# Patient Record
Sex: Male | Born: 1945 | Hispanic: No | Marital: Married | Smoking: Former smoker
Health system: Southern US, Community
[De-identification: ages and names within clinical notes are randomized; demographics above are authoritative.]

## PROBLEM LIST (undated history)

## (undated) DIAGNOSIS — R21 Rash and other nonspecific skin eruption: Secondary | ICD-10-CM

## (undated) DIAGNOSIS — L570 Actinic keratosis: Secondary | ICD-10-CM

## (undated) DIAGNOSIS — Z86718 Personal history of other venous thrombosis and embolism: Secondary | ICD-10-CM

## (undated) DIAGNOSIS — M1990 Unspecified osteoarthritis, unspecified site: Secondary | ICD-10-CM

## (undated) DIAGNOSIS — G4730 Sleep apnea, unspecified: Secondary | ICD-10-CM

## (undated) DIAGNOSIS — C61 Malignant neoplasm of prostate: Secondary | ICD-10-CM

## (undated) HISTORY — DX: Personal history of other venous thrombosis and embolism: Z86.718

## (undated) HISTORY — DX: Actinic keratosis: L57.0

## (undated) HISTORY — PX: TOTAL KNEE ARTHROPLASTY: SHX125

## (undated) HISTORY — DX: Malignant neoplasm of prostate: C61

## (undated) HISTORY — PX: TOE SURGERY: SHX1073

## (undated) HISTORY — DX: Sleep apnea, unspecified: G47.30

---

## 2005-03-01 ENCOUNTER — Ambulatory Visit: Payer: Self-pay | Admitting: Gastroenterology

## 2006-04-17 ENCOUNTER — Ambulatory Visit: Payer: Self-pay | Admitting: Vascular Surgery

## 2008-01-05 ENCOUNTER — Ambulatory Visit: Payer: Self-pay | Admitting: Specialist

## 2008-01-13 ENCOUNTER — Inpatient Hospital Stay: Payer: Self-pay | Admitting: Specialist

## 2009-01-24 ENCOUNTER — Ambulatory Visit: Payer: Self-pay | Admitting: Cardiology

## 2009-01-24 ENCOUNTER — Ambulatory Visit: Payer: Self-pay | Admitting: Specialist

## 2009-02-03 ENCOUNTER — Inpatient Hospital Stay: Payer: Self-pay | Admitting: Specialist

## 2011-02-13 DIAGNOSIS — E669 Obesity, unspecified: Secondary | ICD-10-CM | POA: Insufficient documentation

## 2011-02-22 DIAGNOSIS — E1169 Type 2 diabetes mellitus with other specified complication: Secondary | ICD-10-CM | POA: Insufficient documentation

## 2012-04-17 ENCOUNTER — Emergency Department: Payer: Self-pay | Admitting: Emergency Medicine

## 2012-04-17 LAB — RAPID INFLUENZA A&B ANTIGENS

## 2012-04-17 LAB — CBC WITH DIFFERENTIAL/PLATELET
Basophil #: 0 10*3/uL (ref 0.0–0.1)
Basophil %: 0.3 %
Eosinophil %: 0.2 %
HCT: 44.6 % (ref 40.0–52.0)
HGB: 15 g/dL (ref 13.0–18.0)
MCH: 28.9 pg (ref 26.0–34.0)
MCHC: 33.6 g/dL (ref 32.0–36.0)
Monocyte #: 1.1 x10 3/mm — ABNORMAL HIGH (ref 0.2–1.0)
Monocyte %: 9 %
Neutrophil #: 10.5 10*3/uL — ABNORMAL HIGH (ref 1.4–6.5)
Platelet: 303 10*3/uL (ref 150–440)
RBC: 5.18 10*6/uL (ref 4.40–5.90)
RDW: 13.6 % (ref 11.5–14.5)
WBC: 12.5 10*3/uL — ABNORMAL HIGH (ref 3.8–10.6)

## 2012-04-17 LAB — COMPREHENSIVE METABOLIC PANEL
Albumin: 2.9 g/dL — ABNORMAL LOW (ref 3.4–5.0)
Alkaline Phosphatase: 84 U/L (ref 50–136)
BUN: 25 mg/dL — ABNORMAL HIGH (ref 7–18)
Bilirubin,Total: 0.7 mg/dL (ref 0.2–1.0)
Calcium, Total: 8.6 mg/dL (ref 8.5–10.1)
Co2: 26 mmol/L (ref 21–32)
Creatinine: 1.21 mg/dL (ref 0.60–1.30)
EGFR (Non-African Amer.): >60
SGPT (ALT): 24 U/L (ref 12–78)
Total Protein: 7.6 g/dL (ref 6.4–8.2)

## 2012-04-23 ENCOUNTER — Ambulatory Visit: Payer: Self-pay | Admitting: Family Medicine

## 2012-04-28 ENCOUNTER — Ambulatory Visit: Payer: Self-pay | Admitting: Family Medicine

## 2012-04-29 ENCOUNTER — Ambulatory Visit: Payer: Self-pay | Admitting: Family Medicine

## 2012-04-30 ENCOUNTER — Ambulatory Visit (INDEPENDENT_AMBULATORY_CARE_PROVIDER_SITE_OTHER): Payer: Medicare Other | Admitting: Pulmonary Disease

## 2012-04-30 ENCOUNTER — Telehealth: Payer: Self-pay | Admitting: Pulmonary Disease

## 2012-04-30 ENCOUNTER — Encounter: Payer: Self-pay | Admitting: Pulmonary Disease

## 2012-04-30 ENCOUNTER — Ambulatory Visit: Payer: Self-pay | Admitting: Pulmonary Disease

## 2012-04-30 VITALS — BP 128/82 | HR 94 | Temp 98.2°F | Ht 70.0 in | Wt 240.6 lb

## 2012-04-30 DIAGNOSIS — R600 Localized edema: Secondary | ICD-10-CM | POA: Insufficient documentation

## 2012-04-30 DIAGNOSIS — J90 Pleural effusion, not elsewhere classified: Secondary | ICD-10-CM

## 2012-04-30 DIAGNOSIS — R609 Edema, unspecified: Secondary | ICD-10-CM

## 2012-04-30 DIAGNOSIS — Z8709 Personal history of other diseases of the respiratory system: Secondary | ICD-10-CM | POA: Insufficient documentation

## 2012-04-30 NOTE — Telephone Encounter (Signed)
Stephanie at [employer] [facility] called stated that pt  report for his bilat leg doppler was Neg for dvt. Verbally let KC know.

## 2012-04-30 NOTE — Assessment & Plan Note (Signed)
The patient appears to have a very loculated left pleural effusion but I suspect is parapneumonic in nature given the patient's history.  More than likely this is not infected, but it is causing significant right lower lobe atelectasis, shortness of breath, and pleuritic pain.  We could certainly attempt a thoracentesis, but I suspect this will be suboptimal treatment.  More than likely he is going to need thoracoscopic surgery for drainage.  I would like to send him to a thoracic surgeon for their opinion, and the patient is agreeable.

## 2012-04-30 NOTE — Patient Instructions (Addendum)
Will get you referred to a chest surgeon to look at your fluid collection in left chest. Will ultrasound your legs to make sure no blood clots, and call you with results.

## 2012-04-30 NOTE — Progress Notes (Signed)
  Subjective:    Patient ID: Eric Stone, male    DOB: 1945/11/15, 67 y.o.   MRN: [id number]  HPI The patient is a 67 year old male who I've been asked to see for persistent pulmonary symptoms and an abnormal CT chest.  The patient was in his usual state of health with no breathing issues or cough until January of this year.  He began to have cough, malaise, worsening shortness of breath, as well as a low grade fever.  He didn't begin to develop some discolored mucus.  He had a chest x-ray that apparently showed an abnormal left lower lobe airspace process, and was ultimately treated for pneumonia with Levaquin and then a Z-Pak.  A followup chest x-ray continued to be abnormal, and then he had a CT chest.  This showed a moderate pleural effusion that is clearly loculated, and extends up the left chest wall into the apex.  There is compression of the left lower lobe with significant atelectasis, and a few tiny pulmonary nodules that appear most consistent with granulomas.  The patient feels somewhat better since being treated with antibiotics, but continues to have a cough with white mucus production.  He also has chills but no fever, and has persistent shortness of breath with activity and if he tries to lie down.  He also notes left-sided pleuritic chest pain at times.  He also has developed worsening lower extremity edema since all of this has been going on.  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   Review of Systems  Constitutional: Positive for appetite change. Negative for fever and unexpected weight change.  HENT: Negative for ear pain, nosebleeds, congestion, sore throat, rhinorrhea, sneezing, trouble swallowing, dental problem, postnasal drip and sinus pressure.   Eyes: Negative for redness and itching.  Respiratory: Positive for cough and shortness of breath. Negative for chest tightness and wheezing.   Cardiovascular: Negative for palpitations and leg swelling.   Gastrointestinal: Negative for nausea and vomiting.  Genitourinary: Negative for dysuria.  Musculoskeletal: Positive for joint swelling.  Skin: Negative for rash.  Neurological: Negative for headaches.  Hematological: Does not bruise/bleed easily.  Psychiatric/Behavioral: Negative for dysphoric mood. The patient is not nervous/anxious.        Objective:   Physical Exam Constitutional:  Well developed, no acute distress  HENT:  Nares patent without discharge  Oropharynx without exudate, palate and uvula are normal  Eyes:  Perrla, eomi, no scleral icterus  Neck:  No JVD, no TMG  Cardiovascular:  Normal rate, regular rhythm, no rubs or gallops.  No murmurs        Intact distal pulses, but diminished.  Pulmonary :  Decreased bs right base, no stridor or respiratory distress   No rhonchi, or wheezing.  + crackles left base, no e to a changes heard  Abdominal:  Soft, nondistended, bowel sounds present.  No tenderness noted.   Musculoskeletal:  2+ lower extremity edema noted.  Lymph Nodes:  No cervical lymphadenopathy noted  Skin:  No cyanosis noted  Neurologic:  Alert, appropriate, moves all 4 extremities without obvious deficit.         Assessment & Plan:

## 2012-04-30 NOTE — Assessment & Plan Note (Signed)
The patient has significant bilateral lower extremity edema that has developed since this particular illness.  The patient has a history of DVT in the distant past, and admits that he has been very sedentary.  I think it is prudent to do lower extremity venous Dopplers for completeness.  These will be ordered at [facility].

## 2012-05-01 ENCOUNTER — Telehealth: Payer: Self-pay | Admitting: Pulmonary Disease

## 2012-05-02 NOTE — Telephone Encounter (Signed)
Pt aware he should here from someone today with an appt time and date for next week Eric Stone

## 2012-05-05 ENCOUNTER — Other Ambulatory Visit: Payer: Self-pay

## 2012-05-05 ENCOUNTER — Encounter (HOSPITAL_COMMUNITY): Payer: Self-pay

## 2012-05-05 ENCOUNTER — Encounter (HOSPITAL_COMMUNITY): Payer: Self-pay | Admitting: Pharmacy Technician

## 2012-05-05 ENCOUNTER — Institutional Professional Consult (permissible substitution) (INDEPENDENT_AMBULATORY_CARE_PROVIDER_SITE_OTHER): Payer: Medicare Other | Admitting: Cardiothoracic Surgery

## 2012-05-05 ENCOUNTER — Encounter (HOSPITAL_COMMUNITY)
Admission: RE | Admit: 2012-05-05 | Discharge: 2012-05-05 | Disposition: A | Discharge status: Home / Self Care | Payer: Medicare Other | Source: Ambulatory Visit | Attending: Cardiothoracic Surgery | Admitting: Cardiothoracic Surgery

## 2012-05-05 ENCOUNTER — Encounter: Payer: Self-pay | Admitting: Cardiothoracic Surgery

## 2012-05-05 VITALS — BP 141/91 | HR 101 | Temp 97.6°F | Resp 20 | Ht 70.0 in | Wt 237.4 lb

## 2012-05-05 VITALS — BP 156/94 | HR 91 | Resp 16 | Ht 70.0 in | Wt 240.0 lb

## 2012-05-05 DIAGNOSIS — J90 Pleural effusion, not elsewhere classified: Secondary | ICD-10-CM

## 2012-05-05 HISTORY — DX: Unspecified osteoarthritis, unspecified site: M19.90

## 2012-05-05 HISTORY — DX: Rash and other nonspecific skin eruption: R21

## 2012-05-05 LAB — PROTIME-INR
INR: 1.03 (ref 0.00–1.49)
Prothrombin Time: 13.4 seconds (ref 11.6–15.2)

## 2012-05-05 LAB — COMPREHENSIVE METABOLIC PANEL
ALT: 18 U/L (ref 0–53)
AST: 18 U/L (ref 0–37)
Albumin: 2.9 g/dL — ABNORMAL LOW (ref 3.5–5.2)
Alkaline Phosphatase: 57 U/L (ref 39–117)
BUN: 10 mg/dL (ref 6–23)
CO2: 25 mEq/L (ref 19–32)
Calcium: 9.1 mg/dL (ref 8.4–10.5)
Chloride: 104 mEq/L (ref 96–112)
Creatinine, Ser: 0.74 mg/dL (ref 0.50–1.35)
GFR calc Af Amer: >90 mL/min (ref >90)
GFR calc non Af Amer: >90 mL/min (ref >90)
Glucose, Bld: 110 mg/dL — ABNORMAL HIGH (ref 70–99)
Potassium: 3.9 mEq/L (ref 3.5–5.1)
Sodium: 141 mEq/L (ref 135–145)
Total Bilirubin: 0.2 mg/dL — ABNORMAL LOW (ref 0.3–1.2)
Total Protein: 7.5 g/dL (ref 6.0–8.3)

## 2012-05-05 LAB — TYPE AND SCREEN
ABO/RH(D): A POS
Antibody Screen: NEGATIVE

## 2012-05-05 LAB — BLOOD GAS, ARTERIAL
Acid-Base Excess: 1.2 mmol/L (ref 0.0–2.0)
Bicarbonate: 24.7 mEq/L — ABNORMAL HIGH (ref 20.0–24.0)
Drawn by: 206361
FIO2: 0.21 %
O2 Saturation: 96.5 %
Patient temperature: 98.6
TCO2: 25.8 mmol/L (ref 0–100)
pCO2 arterial: 35.3 mmHg (ref 35.0–45.0)
pH, Arterial: 7.458 — ABNORMAL HIGH (ref 7.350–7.450)
pO2, Arterial: 83.5 mmHg (ref 80.0–100.0)

## 2012-05-05 LAB — URINALYSIS, ROUTINE W REFLEX MICROSCOPIC
Bilirubin Urine: NEGATIVE
Glucose, UA: NEGATIVE mg/dL
Hgb urine dipstick: NEGATIVE
Ketones, ur: NEGATIVE mg/dL
Leukocytes, UA: NEGATIVE
Nitrite: NEGATIVE
Protein, ur: NEGATIVE mg/dL
Specific Gravity, Urine: 1.02 (ref 1.005–1.030)
Urobilinogen, UA: 0.2 mg/dL (ref 0.0–1.0)
pH: 5.5 (ref 5.0–8.0)

## 2012-05-05 LAB — SURGICAL PCR SCREEN
MRSA, PCR: NEGATIVE
Staphylococcus aureus: NEGATIVE

## 2012-05-05 LAB — CBC
HCT: 41.2 % (ref 39.0–52.0)
Hemoglobin: 13.9 g/dL (ref 13.0–17.0)
MCH: 28.9 pg (ref 26.0–34.0)
MCHC: 33.7 g/dL (ref 30.0–36.0)
MCV: 85.7 fL (ref 78.0–100.0)
Platelets: 326 10*3/uL (ref 150–400)
RBC: 4.81 MIL/uL (ref 4.22–5.81)
RDW: 13 % (ref 11.5–15.5)
WBC: 8.3 10*3/uL (ref 4.0–10.5)

## 2012-05-05 LAB — APTT: aPTT: 34 seconds (ref 24–37)

## 2012-05-05 LAB — ABO/RH: ABO/RH(D): A POS

## 2012-05-05 MED ORDER — VANCOMYCIN HCL 10 G IV SOLR
1500.0000 mg | INTRAVENOUS | Status: AC
  Administered 2012-05-06: 1500 mg via INTRAVENOUS

## 2012-05-05 NOTE — Patient Instructions (Signed)
Thoracoscopy Thoracoscopy is a procedure in which a thin, lighted tube (thoracoscope) is put through a small cut (incision) in the chest wall. This procedure makes it possible for your caregiver to look at the lungs or other structures in the chest cavity and to do some minor operations. This is a more minor procedure than thoracotomy, which opens the chest cavity with a large incision. Thoracoscopy can sometimes be used instead of thoracotomy. Thoracoscopy usually involves less pain, a shorter hospital stay, and a shorter recovery time. Common reasons for this procedure are:  To study diseases or problems in the chest.  To take a tissue sample (biopsy) to study under a microscope.  To put medicines directly into the lungs.  To remove collections of fluid, pus (empyema), or blood in the chest. LET YOUR CAREGIVER KNOW ABOUT:   Allergies to food or medicine.  Medicines taken, including vitamins, herbs, eyedrops, over-the-counter medicines, and creams.  Use of steroids (by mouth or creams).  Previous problems with anesthetics or numbing medicines.  History of bleeding problems or blood clots.  Previous surgery.  Any history of heart problems.  Other health problems, including diabetes and kidney problems.  Possibility of pregnancy, if this applies. RISKS AND COMPLICATIONS   If too much bleeding occurs, or if the surgery turns out to be major, it may be necessary to open the chest (thoracotomy) to control the bleeding.  There is a risk of injury to nerves or other structures in the chest as a result of the placement of the instruments.  When the chest tube is removed, the lung may collapse (pneumothorax). If this happens, the tube may need to be reinserted and left in place until a time when the lung will remain expanded as the tube is removed. BEFORE THE PROCEDURE   You may have routine tests done, such as blood tests, urine tests, and chest X-rays.  Electrocardiography (EKG) to  record the electrical activity of the heart may be done to make sure the heart is okay.  Do not eat or drink after midnight the night before the procedure. Medicine given before the procedure that makes you sleep (general anesthetic) may cause vomiting. A patient who vomits is in danger of inhaling food into the lungs. This can cause serious complications and can be life-threatening. PROCEDURE  Video-assisted thoracic surgery (VATS) is surgery using a thoracoscope with a small video camera on the end. The picture from inside the chest is displayed on a television screen for the surgeon to see. The lung being worked on is collapsed and several small incisions are made to insert instruments. The surgeon can manipulate the instruments while watching on the television screen. The thoracoscope may be removed and put into different areas as needed. When the procedure is finished, the surgeon expands the lung and puts one or more chest tubes in the chest. The chest tubes allow the lung to expand and allow fluid (drainage) to come out. The remaining incisions are closed with stitches (sutures) or staples. AFTER THE PROCEDURE   The chest tube is left in place for 1 to several days to drain fluid or air from the chest cavity.  Hospital stays range from 1 to 5 days depending on the procedure and treatment. Document Released: 12/09/2002 Document Revised: 06/04/2011 Document Reviewed: 08/30/2010 [facility] Patient Information 2013 [place], [place]. Thoracoscopy Care After Refer to this sheet in the next few weeks. These discharge instructions provide you with general information on caring for yourself after you leave the  hospital. Your caregiver may also give you specific instructions. Your treatment has been planned according to the most current medical practices available, but unavoidable complications sometimes occur. If you have any problems or questions after discharge, call your caregiver. HOME CARE  INSTRUCTIONS   Remove the bandage (dressing) over your chest tube site as directed by your caregiver.  It is normal to be sore for a couple weeks following surgery. See your caregiver if this seems to be getting worse rather than better.  Only take over-the-counter or prescription medicines for pain, discomfort, or fever as directed by your caregiver. It is very important to take pain medicine when you need it so that you will cough and breathe deeply enough to clear mucus (phlegm) and expand your lungs.  If it hurts to cough, hold a pillow against your chest when you cough. This may help with the discomfort. In spite of the discomfort, cough frequently, as this helps protect against getting an infection in your lung (pneumonia).  Taking deep breaths keeps lungs inflated and protects against pneumonia. Most patients will go home with an incentive spirometer that encourages deep breathing.  You may resume a normal diet and activities as directed.  Use showers for bathing until you see your caregiver, or as instructed.  Change dressings if necessary or as directed.  Avoid lifting or driving until you are instructed otherwise.  Make an appointment to see your caregiver for stitch (suture) or staple removal when instructed.  Do not travel by airplane for 2 weeks after the chest tube is removed. SEEK MEDICAL CARE IF:   You are bleeding from your wounds.  You have redness, swelling, or increasing pain in the wounds.  Your heartbeat feels irregular or very fast.  There is pus coming from your wounds.  There is a bad smell coming from the wound or dressing. SEEK IMMEDIATE MEDICAL CARE IF:   You have a fever.  You develop a rash.  You have difficulty breathing.  You develop any reaction or side effects to medicines given.  You develop lightheadedness or feel faint.  You develop shortness of breath or chest pain. MAKE SURE YOU:   Understand these instructions.  Will watch  your condition.  Will get help right away if you are not doing well or get worse. Document Released: 09/29/2004 Document Revised: 06/04/2011 Document Reviewed: 08/30/2010 [facility] Patient Information 2013 [employer], [place].

## 2012-05-05 NOTE — Progress Notes (Signed)
[address]            [place] [address]          [phone]      Dymond Spreen [facility] Medical Record #[id number] Date of Birth: 1945-08-12  Referring: Barbaraann Share, MD Primary Care: Park Pope, MD  Chief Complaint:    Chief Complaint  Patient presents with  . Pleural Effusion    left...eval and treat    History of Present Illness:      The patient  with no breathing issues or cough until January of this year. He began to have cough, malaise, worsening shortness of breath, as well as a low grade fever. He had a chest x-ray that apparently showed an abnormal left lower lobe airspace process, and was ultimately treated for pneumonia with Levaquin and then a Z-Pak. A followup chest x-ray continued to be abnormal, and then he had a CT chest.  The patient feels somewhat better since being treated with antibiotics, but continues to have a cough with white mucus production. He also has chills but no fever, and has persistent shortness of breath with activity and if he tries to lie down. He also notes left-sided pleuritic chest pain at times. He also has developed worsening lower extremity edema since all of this has been going on. 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  Patient has no known history of exposure to asbestos or tuberculosis. He has been a smoker in the past quit in 1991. He has not had pneumococcal or flu vaccination.   Current Activity/ Functional Status:  Patient is independent with mobility/ambulation, transfers, ADL's, IADL's.  Zubrod Score: At the time of surgery this patient's most appropriate activity status/level should be described as: []  Normal activity, no symptoms [x]  Symptoms, fully ambulatory []  Symptoms, in bed less than or equal to 50% of the time []  Symptoms, in bed greater than 50% of the time but less than 100% []  Bedridden []  Moribund   Past Medical History  Diagnosis Date  .  H/O blood clots   . Sleep apnea   . Pneumonia     Past Surgical History  Procedure Laterality Date  . 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 Hypertension Mother   . Stroke Mother     possible??   patient's brother died of carcinoma the lung in his 39s he was a smoker  History   Social History  . Marital Status: Married    Spouse Name: N/A    Number of Children: N/A  . Years of Education: N/A   Occupational History  . retired  patient is retired previously worked in Hotel manager facility , patient denies any exposure known to him to asbestos    Social History Main Topics  . Smoking status: Former Smoker -- 1.00 packs/day for 26 years    Types: Cigarettes    Quit date: 03/26/1989  . Smokeless tobacco: Not on file     Comment: less than 1 PPD  . Alcohol Use: No  . Drug Use: No  . Sexually Active: Not on file      History  Smoking status  . Former Smoker -- 1.00 packs/day for 26 years  . Types: Cigarettes  . Quit date:  03/26/1989  Smokeless tobacco  . Not on file    Comment: less than 1 PPD    History  Alcohol Use No     Allergies  Allergen Reactions  . Penicillins     As a child    Current Outpatient Prescriptions  Medication Sig Dispense Refill  . aspirin 81 MG tablet Take 81 mg by mouth daily.       No current facility-administered medications for this visit.       Review of Systems:     Cardiac Review of Systems: Y or N  Chest Pain [   y ]  Resting SOB [  n ] Exertional SOB  Cove.Etienne  ]  Orthopnea [ n ]   Pedal Edema [ y  ]    Palpitations [n  ] Syncope  [ n ]   Presyncope [ n  ]  General Review of Systems: [Y] = yes [  ]=no Constitional: recent weight change Milo.Brash  ]; anorexia [ n ]; fatigue Cove.Etienne  ]; nausea [n  ]; night sweats Cove.Etienne  ]; fever [ y]; or chills [  ];                                                                                                                                           Dental: poor dentition[  ]; Last Dentist visit:   Eye : blurred vision [  ]; diplopia [   ]; vision changes [  ];  Amaurosis fugax[  ]; Resp: cough [ y ];  wheezing[ n ];  hemoptysis[n  ]; shortness of breath[  ]; paroxysmal nocturnal dyspnea[  ]; dyspnea on exertion[y  ]; or orthopnea[  ];  GI:  gallstones[  ], vomiting[  ];  dysphagia[  ]; melena[  ];  hematochezia [  ]; heartburn[  ];   Hx of  Colonoscopy[  ]; GU: kidney stones [  ]; hematuria[  ];   dysuria [  ];  nocturia[  ];  history of     obstruction [  ]; urinary frequency [  ]             Skin: rash, swelling[  ];, hair loss[  ];  peripheral edema[  ];  or itching[  ]; Musculosketetal: myalgias[  ];  joint swelling[  ];  joint erythema[  ];  joint pain[  ];  back pain[  ];  Heme/Lymph: bruising[  ];  bleeding[  ];  anemia[  ];  Neuro: TIA[  ];  headaches[  ];  stroke[  ];  vertigo[  ];  seizures[  ];   paresthesias[  ];  difficulty walking[  ];  Psych:depression[  ]; anxiety[  ];  Endocrine: diabetes[ n ];  thyroid dysfunction[ n ];  Immunizations: Flu Milo.Brash  ]; Pneumococcal[ n ];  Other:  Physical Exam: BP 156/94  Pulse 91  Resp 16  Ht 5\' 10"  (1.778 m)  Wt 240 lb (108.863 kg)  BMI 34.44 kg/m2  SpO2 97%  General appearance: alert and cooperative Neurologic: intact Heart: regular rate and rhythm, S1, S2 normal, no murmur, click, rub or gallop and normal apical impulse Lungs: diminished breath sounds LLL and LUL Abdomen: soft, non-tender; bowel sounds normal; no masses,  no organomegaly Extremities: edema Patient has bilateral lower extremity edema  Recent venous Doppler showed no evidence of DVT  Diagnostic Studies & Laboratory data:     Recent Radiology Findings:  CT scan done in [place] shows complex loculated significant pleural effusion on the left,   Recent Lab Findings: No results found for this basename: WBC, HGB, HCT, PLT, GLUCOSE, CHOL, TRIG, HDL, LDLDIRECT, LDLCALC, ALT, AST, NA, K,  CL, CREATININE, BUN, CO2, TSH, INR, GLUF, HGBA1C      Assessment / Plan:    Patient with a history of upper respiratory infection x1 month now presents with complex left pleural effusion, and exertional shortness of breath  On review of the CT scan the patient has a complex large left pleural effusion probably parapneumonic related to pneumonia in January. This would best be treated with bronchoscopy video-assisted thoracoscopy with drainage and decortication. The patient does not appear actively toxic/septic at this time, I have her recommended to the patient that we proceed expeditiously to avoid the development of chronic fibrothorax. The risks and options are discussed with he and his wife in detail and he is willing to proceed. We'll make arrangements to proceed with bronchoscopy left video-assisted thoracoscopy and decortication tomorrow.      Delight Ovens MD  Beeper [phone] Office [phone] 05/05/2012 1:00 PM

## 2012-05-05 NOTE — Pre-Procedure Instructions (Signed)
Eric Stone  05/05/2012   Your procedure is scheduled on:  Tues, Feb 11 @ 10:26 AM  Report to [facility] at 7:30 AM.  Call this number if you have problems the morning of surgery: [phone]   Remember:   Do not eat food or drink liquids after midnight.   Take these medicines the morning of surgery with A SIP OF WATER:    Do not wear jewelry  Do not wear lotions, powders, or colognes. You may wear deodorant.  Men may shave face and neck.  Do not bring valuables to the hospital.  Contacts, dentures or bridgework may not be worn into surgery.  Leave suitcase in the car. After surgery it may be brought to your room.  For patients admitted to the hospital, checkout time is 11:00 AM the day of  discharge.   Patients discharged the day of surgery will not be allowed to drive  home.    Special Instructions: Shower using CHG 2 nights before surgery and the night before surgery.  If you shower the day of surgery use CHG.  Use special wash - you have one bottle of CHG for all showers.  You should use approximately 1/3 of the bottle for each shower.   Please read over the following fact sheets that you were given: Pain Booklet, Coughing and Deep Breathing, Blood Transfusion Information, MRSA Information and Surgical Site Infection Prevention

## 2012-05-06 ENCOUNTER — Encounter (HOSPITAL_COMMUNITY): Payer: Self-pay | Admitting: Certified Registered Nurse Anesthetist

## 2012-05-06 ENCOUNTER — Inpatient Hospital Stay (HOSPITAL_COMMUNITY): Payer: Medicare Other | Admitting: Certified Registered Nurse Anesthetist

## 2012-05-06 ENCOUNTER — Encounter (HOSPITAL_COMMUNITY): Payer: Self-pay | Admitting: *Deleted

## 2012-05-06 ENCOUNTER — Encounter (HOSPITAL_COMMUNITY)
Admission: RE | Disposition: A | Discharge status: Home / Self Care | Payer: Self-pay | Source: Ambulatory Visit | Attending: Cardiothoracic Surgery

## 2012-05-06 ENCOUNTER — Inpatient Hospital Stay (HOSPITAL_COMMUNITY): Payer: Medicare Other

## 2012-05-06 ENCOUNTER — Inpatient Hospital Stay (HOSPITAL_COMMUNITY)
Admission: RE | Admit: 2012-05-06 | Discharge: 2012-05-09 | DRG: 167 | Disposition: A | Discharge status: Home / Self Care | Payer: Medicare Other | Source: Ambulatory Visit | Attending: Cardiothoracic Surgery | Admitting: Cardiothoracic Surgery

## 2012-05-06 DIAGNOSIS — Z8709 Personal history of other diseases of the respiratory system: Secondary | ICD-10-CM | POA: Diagnosis present

## 2012-05-06 DIAGNOSIS — J90 Pleural effusion, not elsewhere classified: Secondary | ICD-10-CM

## 2012-05-06 DIAGNOSIS — R609 Edema, unspecified: Secondary | ICD-10-CM | POA: Diagnosis present

## 2012-05-06 DIAGNOSIS — J9819 Other pulmonary collapse: Secondary | ICD-10-CM | POA: Diagnosis not present

## 2012-05-06 DIAGNOSIS — Z96659 Presence of unspecified artificial knee joint: Secondary | ICD-10-CM

## 2012-05-06 DIAGNOSIS — G4730 Sleep apnea, unspecified: Secondary | ICD-10-CM | POA: Diagnosis present

## 2012-05-06 DIAGNOSIS — M129 Arthropathy, unspecified: Secondary | ICD-10-CM | POA: Diagnosis present

## 2012-05-06 HISTORY — PX: VIDEO ASSISTED THORACOSCOPY (VATS)/DECORTICATION: SHX6171

## 2012-05-06 HISTORY — PX: VIDEO BRONCHOSCOPY: SHX5072

## 2012-05-06 LAB — BODY FLUID CELL COUNT WITH DIFFERENTIAL
Eos, Fluid: 7 %
Lymphs, Fluid: 78 %
Monocyte-Macrophage-Serous Fluid: 14 % — ABNORMAL LOW (ref 50–90)
Neutrophil Count, Fluid: 1 % (ref 0–25)
Total Nucleated Cell Count, Fluid: 570 cu mm (ref 0–1000)

## 2012-05-06 LAB — PROTEIN, BODY FLUID: Total protein, fluid: 4.9 g/dL

## 2012-05-06 LAB — GLUCOSE, SEROUS FLUID: Glucose, Fluid: 98 mg/dL

## 2012-05-06 LAB — LACTATE DEHYDROGENASE, PLEURAL OR PERITONEAL FLUID: LD, Fluid: 196 U/L — ABNORMAL HIGH (ref 3–23)

## 2012-05-06 SURGERY — BRONCHOSCOPY, VIDEO-ASSISTED
Anesthesia: General | Site: Chest | Wound class: Clean Contaminated

## 2012-05-06 MED ORDER — ONDANSETRON HCL 4 MG/2ML IJ SOLN: 4.0000 mg | Freq: Once | INTRAMUSCULAR | Status: DC | PRN

## 2012-05-06 MED ORDER — HYDROMORPHONE HCL PF 1 MG/ML IJ SOLN
0.2500 mg | INTRAMUSCULAR | Status: DC | PRN
  Administered 2012-05-06 (×4): 0.5 mg via INTRAVENOUS

## 2012-05-06 MED ORDER — LACTATED RINGERS IV SOLN
INTRAVENOUS | Status: DC | PRN
  Administered 2012-05-06 (×2): via INTRAVENOUS

## 2012-05-06 MED ORDER — LACTATED RINGERS IV SOLN
INTRAVENOUS | Status: DC | PRN
  Administered 2012-05-06: 10:00:00 via INTRAVENOUS

## 2012-05-06 MED ORDER — ASPIRIN EC 81 MG PO TBEC
81.0000 mg | DELAYED_RELEASE_TABLET | Freq: Every day | ORAL | Status: DC
  Administered 2012-05-07 – 2012-05-09 (×3): 81 mg via ORAL
  Filled 2012-05-06 (×3): qty 1

## 2012-05-06 MED ORDER — OXYCODONE-ACETAMINOPHEN 5-325 MG PO TABS
1.0000 | ORAL_TABLET | ORAL | Status: DC | PRN
  Administered 2012-05-07 – 2012-05-08 (×2): 1 via ORAL
  Administered 2012-05-08 (×2): 2 via ORAL
  Filled 2012-05-06: qty 1
  Filled 2012-05-06: qty 2
  Filled 2012-05-06: qty 1
  Filled 2012-05-06: qty 2

## 2012-05-06 MED ORDER — HYDROMORPHONE HCL PF 1 MG/ML IJ SOLN
INTRAMUSCULAR | Status: AC
  Administered 2012-05-06: 0.5 mg via INTRAVENOUS
  Filled 2012-05-06: qty 1

## 2012-05-06 MED ORDER — SODIUM CHLORIDE 0.9 % IV SOLN
10.0000 mg | INTRAVENOUS | Status: DC | PRN
  Administered 2012-05-06: 40 ug/min via INTRAVENOUS

## 2012-05-06 MED ORDER — ROCURONIUM BROMIDE 100 MG/10ML IV SOLN
INTRAVENOUS | Status: DC | PRN
  Administered 2012-05-06: 50 mg via INTRAVENOUS

## 2012-05-06 MED ORDER — LIDOCAINE HCL (CARDIAC) 20 MG/ML IV SOLN
INTRAVENOUS | Status: DC | PRN
  Administered 2012-05-06: 100 mg via INTRAVENOUS

## 2012-05-06 MED ORDER — HYDROMORPHONE HCL PF 1 MG/ML IJ SOLN: 0.2500 mg | INTRAMUSCULAR | Status: DC | PRN

## 2012-05-06 MED ORDER — ONDANSETRON HCL 4 MG/2ML IJ SOLN
INTRAMUSCULAR | Status: DC | PRN
  Administered 2012-05-06: 4 mg via INTRAVENOUS

## 2012-05-06 MED ORDER — OXYCODONE HCL 5 MG PO TABS: 5.0000 mg | ORAL_TABLET | ORAL | Status: AC | PRN

## 2012-05-06 MED ORDER — VECURONIUM BROMIDE 10 MG IV SOLR
INTRAVENOUS | Status: DC | PRN
  Administered 2012-05-06: 2 mg via INTRAVENOUS

## 2012-05-06 MED ORDER — FENTANYL CITRATE 0.05 MG/ML IJ SOLN
INTRAMUSCULAR | Status: DC | PRN
  Administered 2012-05-06: 200 ug via INTRAVENOUS
  Administered 2012-05-06: 50 ug via INTRAVENOUS

## 2012-05-06 MED ORDER — SENNOSIDES-DOCUSATE SODIUM 8.6-50 MG PO TABS
1.0000 | ORAL_TABLET | Freq: Every evening | ORAL | Status: DC | PRN
  Filled 2012-05-06: qty 1

## 2012-05-06 MED ORDER — LIDOCAINE HCL 4 % MT SOLN
OROMUCOSAL | Status: DC | PRN
  Administered 2012-05-06: 4 mL via TOPICAL

## 2012-05-06 MED ORDER — ASPIRIN 81 MG PO TABS: 81.0000 mg | ORAL_TABLET | Freq: Every day | ORAL | Status: DC

## 2012-05-06 MED ORDER — ACETAMINOPHEN 10 MG/ML IV SOLN
INTRAVENOUS | Status: AC
  Administered 2012-05-06: 1000 mg via INTRAVENOUS
  Filled 2012-05-06: qty 100

## 2012-05-06 MED ORDER — FENTANYL CITRATE 0.05 MG/ML IJ SOLN
25.0000 ug | INTRAMUSCULAR | Status: DC | PRN
  Administered 2012-05-06 – 2012-05-07 (×8): 50 ug via INTRAVENOUS
  Filled 2012-05-06 (×8): qty 2

## 2012-05-06 MED ORDER — 0.9 % SODIUM CHLORIDE (POUR BTL) OPTIME
TOPICAL | Status: DC | PRN
  Administered 2012-05-06: 2000 mL

## 2012-05-06 MED ORDER — PROPOFOL 10 MG/ML IV BOLUS
INTRAVENOUS | Status: DC | PRN
  Administered 2012-05-06: 200 mg via INTRAVENOUS

## 2012-05-06 MED ORDER — ONDANSETRON HCL 4 MG/2ML IJ SOLN: 4.0000 mg | Freq: Four times a day (QID) | INTRAMUSCULAR | Status: DC | PRN

## 2012-05-06 MED ORDER — GLYCOPYRROLATE 0.2 MG/ML IJ SOLN
INTRAMUSCULAR | Status: DC | PRN
  Administered 2012-05-06: .8 mg via INTRAVENOUS

## 2012-05-06 MED ORDER — KCL IN DEXTROSE-NACL 20-5-0.45 MEQ/L-%-% IV SOLN
INTRAVENOUS | Status: DC
  Administered 2012-05-06 – 2012-05-08 (×3): via INTRAVENOUS
  Filled 2012-05-06 (×5): qty 1000

## 2012-05-06 MED ORDER — PHENOL 1.4 % MT LIQD: 1.0000 | OROMUCOSAL | Status: DC | PRN

## 2012-05-06 MED ORDER — POTASSIUM CHLORIDE 10 MEQ/50ML IV SOLN
10.0000 meq | Freq: Every day | INTRAVENOUS | Status: DC | PRN
  Administered 2012-05-07 (×2): 10 meq via INTRAVENOUS
  Filled 2012-05-06: qty 50
  Filled 2012-05-06: qty 100

## 2012-05-06 MED ORDER — NEOSTIGMINE METHYLSULFATE 1 MG/ML IJ SOLN
INTRAMUSCULAR | Status: DC | PRN
  Administered 2012-05-06: 5 mg via INTRAVENOUS

## 2012-05-06 MED ORDER — FENTANYL CITRATE 0.05 MG/ML IJ SOLN
INTRAMUSCULAR | Status: AC
  Administered 2012-05-06: 50 ug
  Filled 2012-05-06: qty 2

## 2012-05-06 MED ORDER — MIDAZOLAM HCL 5 MG/5ML IJ SOLN
INTRAMUSCULAR | Status: DC | PRN
  Administered 2012-05-06: 2 mg via INTRAVENOUS

## 2012-05-06 MED ORDER — KCL IN DEXTROSE-NACL 20-5-0.45 MEQ/L-%-% IV SOLN
INTRAVENOUS | Status: AC
  Filled 2012-05-06: qty 1000

## 2012-05-06 MED ORDER — BISACODYL 5 MG PO TBEC
10.0000 mg | DELAYED_RELEASE_TABLET | Freq: Every day | ORAL | Status: DC
  Administered 2012-05-07: 10 mg via ORAL
  Filled 2012-05-06 (×2): qty 2

## 2012-05-06 MED ORDER — VANCOMYCIN HCL 10 G IV SOLR
1500.0000 mg | Freq: Once | INTRAVENOUS | Status: DC
  Filled 2012-05-06: qty 1500

## 2012-05-06 MED ORDER — TRAMADOL HCL 50 MG PO TABS: 50.0000 mg | ORAL_TABLET | Freq: Four times a day (QID) | ORAL | Status: DC | PRN

## 2012-05-06 MED ORDER — ACETAMINOPHEN 10 MG/ML IV SOLN
1000.0000 mg | Freq: Four times a day (QID) | INTRAVENOUS | Status: AC
  Administered 2012-05-06 – 2012-05-07 (×4): 1000 mg via INTRAVENOUS
  Filled 2012-05-06 (×3): qty 100

## 2012-05-06 MED ORDER — VANCOMYCIN HCL IN DEXTROSE 1-5 GM/200ML-% IV SOLN
1000.0000 mg | Freq: Two times a day (BID) | INTRAVENOUS | Status: AC
Start: 2012-05-06 — End: 2012-05-06
  Administered 2012-05-06: 1000 mg via INTRAVENOUS
  Filled 2012-05-06: qty 200

## 2012-05-06 SURGICAL SUPPLY — 73 items
APPLICATOR TIP COSEAL (VASCULAR PRODUCTS) IMPLANT
APPLICATOR TIP EXT COSEAL (VASCULAR PRODUCTS) IMPLANT
BLADE SURG 11 STRL SS (BLADE) ×3 IMPLANT
BRUSH CYTOL CELLEBRITY 1.5X140 (MISCELLANEOUS) IMPLANT
CANISTER SUCTION 2500CC (MISCELLANEOUS) ×3 IMPLANT
CATH KIT ON Q 5IN SLV (PAIN MANAGEMENT) IMPLANT
CATH THORACIC 28FR (CATHETERS) ×6 IMPLANT
CATH THORACIC 36FR (CATHETERS) IMPLANT
CATH THORACIC 36FR RT ANG (CATHETERS) IMPLANT
CLEANER TIP ELECTROSURG 2X2 (MISCELLANEOUS) ×3 IMPLANT
CLIP TI MEDIUM 6 (CLIP) IMPLANT
CLOTH BEACON ORANGE TIMEOUT ST (SAFETY) ×3 IMPLANT
CONT SPEC 4OZ CLIKSEAL STRL BL (MISCELLANEOUS) ×6 IMPLANT
COVER TABLE BACK 60X90 (DRAPES) ×3 IMPLANT
DRAPE LAPAROSCOPIC ABDOMINAL (DRAPES) ×3 IMPLANT
DRAPE WARM FLUID 44X44 (DRAPE) ×3 IMPLANT
DRILL BIT 7/64X5 (BIT) IMPLANT
ELECT BLADE 4.0 EZ CLEAN MEGAD (MISCELLANEOUS) ×3
ELECT REM PT RETURN 9FT ADLT (ELECTROSURGICAL) ×3
ELECTRODE BLDE 4.0 EZ CLN MEGD (MISCELLANEOUS) ×2 IMPLANT
ELECTRODE REM PT RTRN 9FT ADLT (ELECTROSURGICAL) ×2 IMPLANT
FORCEPS BIOP RJ4 1.8 (CUTTING FORCEPS) IMPLANT
GAUZE XEROFORM 1X8 LF (GAUZE/BANDAGES/DRESSINGS) ×3 IMPLANT
GLOVE BIO SURGEON STRL SZ 6.5 (GLOVE) ×15 IMPLANT
GLOVE BIOGEL PI IND STRL 6.5 (GLOVE) ×6 IMPLANT
GLOVE BIOGEL PI IND STRL 7.0 (GLOVE) ×6 IMPLANT
GLOVE BIOGEL PI INDICATOR 6.5 (GLOVE) ×3
GLOVE BIOGEL PI INDICATOR 7.0 (GLOVE) ×3
GLOVE SURG SS PI 7.5 STRL IVOR (GLOVE) ×3 IMPLANT
GOWN STRL NON-REIN LRG LVL3 (GOWN DISPOSABLE) ×12 IMPLANT
KIT BASIN OR (CUSTOM PROCEDURE TRAY) ×3 IMPLANT
KIT ROOM TURNOVER OR (KITS) ×3 IMPLANT
KIT SUCTION CATH 14FR (SUCTIONS) ×3 IMPLANT
MARKER SKIN DUAL TIP RULER LAB (MISCELLANEOUS) ×3 IMPLANT
NEEDLE BIOPSY TRANSBRONCH 21G (NEEDLE) IMPLANT
NS IRRIG 1000ML POUR BTL (IV SOLUTION) ×6 IMPLANT
OIL SILICONE PENTAX (PARTS (SERVICE/REPAIRS)) ×3 IMPLANT
PACK CHEST (CUSTOM PROCEDURE TRAY) ×3 IMPLANT
PAD ARMBOARD 7.5X6 YLW CONV (MISCELLANEOUS) ×6 IMPLANT
SCISSORS LAP 5X35 DISP (ENDOMECHANICALS) IMPLANT
SEALANT PROGEL (MISCELLANEOUS) IMPLANT
SEALANT SURG COSEAL 4ML (VASCULAR PRODUCTS) IMPLANT
SEALANT SURG COSEAL 8ML (VASCULAR PRODUCTS) IMPLANT
SOLUTION ANTI FOG 6CC (MISCELLANEOUS) IMPLANT
SPONGE GAUZE 4X4 12PLY (GAUZE/BANDAGES/DRESSINGS) ×3 IMPLANT
SUT PROLENE 3 0 SH DA (SUTURE) IMPLANT
SUT PROLENE 4 0 RB 1 (SUTURE)
SUT PROLENE 4-0 RB1 .5 CRCL 36 (SUTURE) IMPLANT
SUT SILK  1 MH (SUTURE) ×2
SUT SILK 1 MH (SUTURE) ×4 IMPLANT
SUT SILK 2 0SH CR/8 30 (SUTURE) IMPLANT
SUT SILK 3 0SH CR/8 30 (SUTURE) IMPLANT
SUT STEEL 1 (SUTURE) IMPLANT
SUT VIC AB 1 CTX 18 (SUTURE) IMPLANT
SUT VIC AB 1 CTX 36 (SUTURE)
SUT VIC AB 1 CTX36XBRD ANBCTR (SUTURE) IMPLANT
SUT VIC AB 2-0 CTX 36 (SUTURE) IMPLANT
SUT VIC AB 3-0 X1 27 (SUTURE) IMPLANT
SUT VICRYL 2 TP 1 (SUTURE) IMPLANT
SWAB COLLECTION DEVICE MRSA (MISCELLANEOUS) IMPLANT
SYR 20ML ECCENTRIC (SYRINGE) ×6 IMPLANT
SYSTEM SAHARA CHEST DRAIN ATS (WOUND CARE) ×3 IMPLANT
TAPE CLOTH 4X10 WHT NS (GAUZE/BANDAGES/DRESSINGS) ×3 IMPLANT
TAPE CLOTH SURG 4X10 WHT LF (GAUZE/BANDAGES/DRESSINGS) ×3 IMPLANT
TIP APPLICATOR SPRAY EXTEND 16 (VASCULAR PRODUCTS) IMPLANT
TOWEL OR 17X24 6PK STRL BLUE (TOWEL DISPOSABLE) ×3 IMPLANT
TOWEL OR 17X26 10 PK STRL BLUE (TOWEL DISPOSABLE) ×6 IMPLANT
TRAP SPECIMEN MUCOUS 40CC (MISCELLANEOUS) ×3 IMPLANT
TRAY FOLEY CATH 14FRSI W/METER (CATHETERS) ×3 IMPLANT
TUBE ANAEROBIC SPECIMEN COL (MISCELLANEOUS) IMPLANT
TUBE CONNECTING 12X1/4 (SUCTIONS) ×6 IMPLANT
TUNNELER SHEATH ON-Q 11GX8 (MISCELLANEOUS) IMPLANT
WATER STERILE IRR 1000ML POUR (IV SOLUTION) ×6 IMPLANT

## 2012-05-06 NOTE — Brief Op Note (Addendum)
05/06/2012  1:14 PM  PATIENT:  Eric Stone  67 y.o. male  PRE-OPERATIVE DIAGNOSIS:  Left complex, post pneumonic pleural effusion  POST-OPERATIVE DIAGNOSIS:  Left complex, post pneumonic Pleural Effusion  PROCEDURE:  VIDEO BRONCHOSCOPY , LEFT VIDEO ASSISTED THORACOSCOPY (VATS), DRAINAGE OF LEFT LOCULATED PLEURAL EFFUSION  SURGEON:  Surgeon(s) and Role:    * Delight Ovens, MD - Primary  PHYSICIAN ASSISTANT: [name] PA-C   ANESTHESIA:   general  EBL:  Total I/O In: -  Out: 350 [Urine:350]  BLOOD ADMINISTERED:none  DRAINS: Two 28 French Chest Tube(s) in the left pleural space     SPECIMEN:  Source of Specimen:  Left pleural fluid.  DISPOSITION OF SPECIMEN:  Pathology, culture, and gram stain  COUNTS CORRECT:  YES  DICTATION: .Dragon Dictation  PLAN OF CARE: Admit to inpatient   PATIENT DISPOSITION:  PACU - hemodynamically stable.   Delay start of Pharmacological VTE agent (>24hrs) due to surgical blood loss or risk of bleeding: no

## 2012-05-06 NOTE — OR Nursing (Signed)
05/06/2012- 13:18  Left VATS start-1246   End 1313

## 2012-05-06 NOTE — Anesthesia Procedure Notes (Addendum)
Procedure Name: Intubation Date/Time: 05/06/2012 11:49 AM Performed by: Rogelia Boga Pre-anesthesia Checklist: Patient identified, Emergency Drugs available, Suction available, Patient being monitored and Timeout performed Patient Re-evaluated:Patient Re-evaluated prior to inductionOxygen Delivery Method: Circle system utilized Preoxygenation: Pre-oxygenation with 100% oxygen Intubation Type: IV induction Ventilation: Mask ventilation without difficulty and Oral airway inserted - appropriate to patient size Laryngoscope Size: Mac and 4 Grade View: Grade II Tube type: Oral Tube size: 8.5 mm Number of attempts: 1 Airway Equipment and Method: Stylet Placement Confirmation: ETT inserted through vocal cords under direct vision,  positive ETCO2 and breath sounds checked- equal and bilateral Secured at: 22 cm Tube secured with: Tape Dental Injury: Teeth and Oropharynx as per pre-operative assessment    Procedure Name: Intubation Date/Time: 05/06/2012 12:17 PM Performed by: Rogelia Boga Pre-anesthesia Checklist: Patient identified, Emergency Drugs available, Suction available, Patient being monitored and Timeout performed Patient Re-evaluated:Patient Re-evaluated prior to inductionOxygen Delivery Method: Circle system utilized Preoxygenation: Pre-oxygenation with 100% oxygen Laryngoscope Size: Mac and 4 Grade View: Grade II Tube type: Oral Endobronchial tube: Double lumen EBT, EBT position confirmed by auscultation and EBT position confirmed by fiberoptic bronchoscope and 39 Fr Number of attempts: 1 Airway Equipment and Method: Stylet Placement Confirmation: ETT inserted through vocal cords under direct vision,  positive ETCO2 and breath sounds checked- equal and bilateral Tube secured with: Tape Dental Injury: Teeth and Oropharynx as per pre-operative assessment  Comments: Pt extubated and DL EBT placed by Lyda Kalata, SRNA, +ETCO2, BBS equal, VSS

## 2012-05-06 NOTE — Anesthesia Preprocedure Evaluation (Addendum)
Anesthesia Evaluation  Patient identified by MRN, date of birth, ID band Patient awake    Reviewed: Allergy & Precautions, H&P , NPO status , Patient's Chart, lab work & pertinent test results  History of Anesthesia Complications (+) PONV  Airway Mallampati: II TM Distance: >3 FB Neck ROM: full    Dental  (+) Dental Advisory Given   Pulmonary sleep apnea and Continuous Positive Airway Pressure Ventilation , pneumonia -, resolved,          Cardiovascular Rhythm:regular Rate:Normal     Neuro/Psych    GI/Hepatic   Endo/Other    Renal/GU      Musculoskeletal   Abdominal   Peds  Hematology   Anesthesia Other Findings   Reproductive/Obstetrics                          Anesthesia Physical Anesthesia Plan  ASA: II  Anesthesia Plan: General   Post-op Pain Management:    Induction: Intravenous  Airway Management Planned: Double Lumen EBT and Oral ETT  Additional Equipment: Arterial line and CVP  Intra-op Plan:   Post-operative Plan: Possible Post-op intubation/ventilation and Extubation in OR  Informed Consent: I have reviewed the patients History and Physical, chart, labs and discussed the procedure including the risks, benefits and alternatives for the proposed anesthesia with the patient or authorized representative who has indicated his/her understanding and acceptance.     Plan Discussed with: CRNA, Anesthesiologist and Surgeon  Anesthesia Plan Comments:       Anesthesia Quick Evaluation

## 2012-05-06 NOTE — H&P (Signed)
[address]            [place] [address]          [phone]        [name] [facility] Medical Record #[id number] Date of Birth: 21-Dec-1945  Referring:Dr ClanceCare: Park Pope, MD  Chief Complaint:    Cough and left pleural effusion  History of Present Illness:      The patient  with no breathing issues or cough until January of this year. He began to have cough, malaise, worsening shortness of breath, as well as a low grade fever. He had a chest x-ray that apparently showed an abnormal left lower lobe airspace process, and was ultimately treated for pneumonia with Levaquin and then a Z-Pak. A followup chest x-ray continued to be abnormal, and then he had a CT chest.  The patient feels somewhat better since being treated with antibiotics, but continues to have a cough with white mucus production. He also has chills but no fever, and has persistent shortness of breath with activity and if he tries to lie down. He also notes left-sided pleuritic chest pain at times. He also has developed worsening lower extremity edema since all of this has been going on. 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  Patient has no known history of exposure to asbestos or tuberculosis. He has been a smoker in the past quit in 1991. He has not had pneumococcal or flu vaccination.   Current Activity/ Functional Status:  Patient is independent with mobility/ambulation, transfers, ADL's, IADL's.  Zubrod Score: At the time of surgery this patient's most appropriate activity status/level should be described as: []  Normal activity, no symptoms [x]  Symptoms, fully ambulatory []  Symptoms, in bed less than or equal to 50% of the time []  Symptoms, in bed greater than 50% of the time but less than 100% []  Bedridden []  Moribund   Past Medical History  Diagnosis Date  . H/O blood clots   . PONV (postoperative nausea and vomiting)    . Sleep apnea     uses a CPAP;sleep study >92yrs ago  . Pneumonia     recently treated   . Arthritis   . Rash     occasionally and pt does see a dermatologist  . H/O blood clots     in left leg in 1988    Past Surgical History  Procedure Laterality Date  . 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 Hypertension Mother   . Stroke Mother     possible??   patient's brother died of carcinoma the lung in his 16s he was a smoker  History   Social History  . Marital Status: Married    Spouse Name: N/A    Number of Children: N/A  . Years of Education: N/A   Occupational History  . retired  patient is retired previously worked in Hotel manager facility , patient denies any exposure known to him to asbestos    Social History Main Topics  . Smoking status: Former Smoker -- 1.00 packs/day for 26 years    Types: Cigarettes    Quit date: 03/26/1989  . Smokeless tobacco: Not on  file     Comment: less than 1 PPD  . Alcohol Use: No  . Drug Use: No  . Sexually Active: Not on file      History  Smoking status  . Former Smoker -- 1.00 packs/day for 26 years  . Types: Cigarettes  . Quit date: 03/26/1989  Smokeless tobacco  . Not on file    Comment: less than 1 PPD    History  Alcohol Use No     Allergies  Allergen Reactions  . Penicillins Other (See Comments)    As a child    Current Facility-Administered Medications  Medication Dose Route Frequency Provider Last Rate Last Dose  . 0.9 % irrigation (POUR BTL)    PRN Delight Ovens, MD   2,000 mL at 05/06/12 1042  . HYDROmorphone (DILAUDID) injection 0.25-0.5 mg  0.25-0.5 mg Intravenous Q5 min PRN Rivka Barbara, MD      . ondansetron [facility]) injection 4 mg  4 mg Intravenous Once PRN Rivka Barbara, MD      . vancomycin (VANCOCIN) 1,500 mg in sodium chloride 0.9 % 500 mL IVPB  1,500 mg Intravenous On Call to OR Delight Ovens, MD      . vancomycin (VANCOCIN) 1,500 mg in sodium chloride 0.9 % 500 mL IVPB  1,500 mg Intravenous Once Delight Ovens, MD       Facility-Administered Medications Ordered in Other Encounters  Medication Dose Route Frequency Provider Last Rate Last Dose  . lactated ringers infusion    Continuous PRN Rogelia Boga, CRNA       Prescriptions prior to admission  Medication Sig Dispense Refill  . aspirin 81 MG tablet Take 81 mg by mouth daily.          Review of Systems:     Cardiac Review of Systems: Y or N  Chest Pain [   y ]  Resting SOB [  n ] Exertional SOB  Cove.Etienne  ]  Orthopnea [ n ]   Pedal Edema [ y  ]    Palpitations [n  ] Syncope  [ n ]   Presyncope [ n  ]  General Review of Systems: [Y] = yes [  ]=no Constitional: recent weight change Milo.Brash  ]; anorexia [ n ]; fatigue Cove.Etienne  ]; nausea [n  ]; night sweats Cove.Etienne  ]; fever [ y]; or chills [  ];                                                                                                                                          Dental: poor dentition[  ]; Last Dentist visit:   Eye : blurred vision [  ]; diplopia [   ]; vision changes [  ];  Amaurosis fugax[  ]; Resp: cough [ y ];  wheezing[ n ];  hemoptysis[n  ]; shortness of  breath[  ]; paroxysmal nocturnal dyspnea[  ]; dyspnea on exertion[y  ]; or orthopnea[  ];  GI:  gallstones[  ], vomiting[  ];  dysphagia[  ]; melena[  ];  hematochezia [  ]; heartburn[  ];   Hx of  Colonoscopy[  ]; GU: kidney stones [  ]; hematuria[  ];   dysuria [  ];  nocturia[  ];  history of     obstruction [  ]; urinary frequency [  ]             Skin: rash, swelling[  ];, hair loss[  ];  peripheral edema[  ];  or itching[  ]; Musculosketetal: myalgias[  ];  joint swelling[  ];  joint erythema[  ];  joint pain[  ];  back pain[  ];  Heme/Lymph: bruising[  ];  bleeding[  ];  anemia[  ];  Neuro: TIA[  ];  headaches[  ];  stroke[  ];  vertigo[  ];  seizures[  ];   paresthesias[  ];  difficulty walking[   ];  Psych:depression[  ]; anxiety[  ];  Endocrine: diabetes[ n ];  thyroid dysfunction[ n ];  Immunizations: Flu Milo.Brash  ]; Pneumococcal[ n ];  Other:  Physical Exam: BP 140/91  Pulse 82  Temp(Src) 97.1 F (36.2 C) (Oral)  Resp 20  SpO2 97%  General appearance: alert and cooperative Neurologic: intact Heart: regular rate and rhythm, S1, S2 normal, no murmur, click, rub or gallop and normal apical impulse Lungs: diminished breath sounds LLL and LUL Abdomen: soft, non-tender; bowel sounds normal; no masses,  no organomegaly Extremities: edema Patient has bilateral lower extremity edema  Recent venous Doppler showed no evidence of DVT  Diagnostic Studies & Laboratory data:     Recent Radiology Findings:  CT scan done in [place] shows complex loculated significant pleural effusion on the left,   Recent Lab Findings: Lab Results  Component Value Date   WBC 8.3 05/05/2012   HGB 13.9 05/05/2012   HCT 41.2 05/05/2012   PLT 326 05/05/2012   GLUCOSE 110* 05/05/2012   ALT 18 05/05/2012   AST 18 05/05/2012   NA 141 05/05/2012   K 3.9 05/05/2012   CL 104 05/05/2012   CREATININE 0.74 05/05/2012   BUN 10 05/05/2012   CO2 25 05/05/2012   INR 1.03 05/05/2012    Assessment / Plan:    Patient with a history of upper respiratory infection x1 month now presents with complex left pleural effusion, and exertional shortness of breath  On review of the CT scan the patient has a complex large left pleural effusion probably parapneumonic related to pneumonia in January. This would best be treated with bronchoscopy video-assisted thoracoscopy with drainage and decortication. The patient does not appear actively toxic/septic at this time, I have recommended to the ient that we proceed expeditiously to avoid the development of chronic fibrothorax. The risks and options are discussed with he and his wife in detail and he is willing to proceed. We'll make arrangements to proceed with bronchoscopy left  video-assisted thoracoscopy and decortication tomorrow.  The goals risks and alternatives of the planned surgical procedure bronch left vats decortication patient in detail. The risks of the procedure including death, infection, stroke, myocardial infarction, bleeding, blood transfusion have all been discussed specifically.  I have quoted Eric Stone a 2%f perioperative mortality and a complication rate as high as 15% .Eric Stone is willing  to proceed with the planned procedure. The patients questions have  been answered.    Delight Ovens MD  Beeper [phone] Office [phone] 05/06/2012 11:32 AM

## 2012-05-06 NOTE — Progress Notes (Signed)
Received patient from PACU.  Patient placed on monitor and VVS.  Patient's right flank CT placed on 20 cm of suction per MD with minimal air-leak. Patient and family oriented to unit and routines and updated regarding plan of care.  Will continue to monitor.

## 2012-05-06 NOTE — Progress Notes (Signed)
Utilization review completed.  

## 2012-05-06 NOTE — Anesthesia Postprocedure Evaluation (Signed)
  Anesthesia Post-op Note  Patient: Eric Stone  Procedure(s) Performed: Procedure(s): VIDEO BRONCHOSCOPY (N/A) VIDEO ASSISTED THORACOSCOPY (VATS) drainage loculated pleural effusion (Left)  Patient Location: PACU  Anesthesia Type:General  Level of Consciousness: awake  Airway and Oxygen Therapy: Patient Spontanous Breathing  Post-op Pain: mild  Post-op Assessment: Post-op Vital signs reviewed  Post-op Vital Signs: Reviewed  Complications: No apparent anesthesia complications

## 2012-05-06 NOTE — OR Nursing (Addendum)
Video Bronchoscopy start time 1157. Procedure end at 1201

## 2012-05-06 NOTE — Preoperative (Signed)
Beta Blockers   Reason not to administer Beta Blockers:Not Applicable 

## 2012-05-06 NOTE — Transfer of Care (Signed)
Immediate Anesthesia Transfer of Care Note  Patient: Eric Stone  Procedure(s) Performed: Procedure(s): VIDEO BRONCHOSCOPY (N/A) VIDEO ASSISTED THORACOSCOPY (VATS) drainage loculated pleural effusion (Left)  Patient Location: PACU  Anesthesia Type:General  Level of Consciousness: awake, alert , oriented and patient cooperative  Airway & Oxygen Therapy: Patient Spontanous Breathing and Patient connected to nasal cannula oxygen  Post-op Assessment: Report given to PACU RN, Post -op Vital signs reviewed and stable and Patient moving all extremities X 4  Post vital signs: Reviewed and stable  Complications: No apparent anesthesia complications

## 2012-05-07 ENCOUNTER — Inpatient Hospital Stay (HOSPITAL_COMMUNITY): Payer: Medicare Other

## 2012-05-07 LAB — PATHOLOGIST SMEAR REVIEW

## 2012-05-07 LAB — BASIC METABOLIC PANEL
CO2: 28 mEq/L (ref 19–32)
Chloride: 103 mEq/L (ref 96–112)
Glucose, Bld: 136 mg/dL — ABNORMAL HIGH (ref 70–99)
Sodium: 136 mEq/L (ref 135–145)

## 2012-05-07 LAB — CBC
HCT: 37.3 % — ABNORMAL LOW (ref 39.0–52.0)
Hemoglobin: 12.2 g/dL — ABNORMAL LOW (ref 13.0–17.0)
MCH: 28.2 pg (ref 26.0–34.0)
MCHC: 32.7 g/dL (ref 30.0–36.0)
RBC: 4.33 MIL/uL (ref 4.22–5.81)

## 2012-05-07 LAB — BLOOD GAS, ARTERIAL
Bicarbonate: 27.5 mEq/L — ABNORMAL HIGH (ref 20.0–24.0)
FIO2: 0.21 %
O2 Saturation: 90.7 %
TCO2: 28.8 mmol/L (ref 0–100)
pH, Arterial: 7.443 (ref 7.350–7.450)
pO2, Arterial: 56.5 mmHg — ABNORMAL LOW (ref 80.0–100.0)

## 2012-05-07 MED ORDER — ENOXAPARIN SODIUM 30 MG/0.3ML ~~LOC~~ SOLN
30.0000 mg | SUBCUTANEOUS | Status: DC
  Administered 2012-05-07 – 2012-05-08 (×2): 30 mg via SUBCUTANEOUS
  Filled 2012-05-07 (×3): qty 0.3

## 2012-05-07 MED ORDER — PANTOPRAZOLE SODIUM 40 MG PO TBEC
40.0000 mg | DELAYED_RELEASE_TABLET | Freq: Every day | ORAL | Status: DC
  Administered 2012-05-08: 40 mg via ORAL
  Filled 2012-05-07 (×2): qty 1

## 2012-05-07 NOTE — Progress Notes (Signed)
Patient's Left radial a-line d/c'd per MD order per policy; patient tolerated well and pressure dressing applied.  Will continue to monitor.

## 2012-05-07 NOTE — Progress Notes (Addendum)
                   [address]            [place] [address]          [phone]    1 Day Post-Op Procedure(s) (LRB): VIDEO BRONCHOSCOPY (N/A) VIDEO ASSISTED THORACOSCOPY (VATS) drainage loculated pleural effusion (Left)  Subjective: Patient feeling ok this am. Has some pain at chest tube sites.  Objective: Vital signs in last 24 hours: Temp:  [97.5 F (36.4 C)-98.1 F (36.7 C)] 97.5 F (36.4 C) (02/12 0400) Pulse Rate:  [55-87] 77 (02/12 0400) Cardiac Rhythm:  [-] Normal sinus rhythm (02/12 0400) Resp:  [13-23] 19 (02/12 0400) BP: (110-159)/(64-102) 110/64 mmHg (02/12 0400) SpO2:  [91 %-98 %] 91 % (02/12 0400) Arterial Line BP: (135-172)/(57-114) 142/62 mmHg (02/12 0400) Weight:  [112.7 kg (248 lb 7.3 oz)] 112.7 kg (248 lb 7.3 oz) (02/11 1525)      Intake/Output from previous day: 02/11 0701 - 02/12 0700 In: 3600 [I.V.:3500; IV Piggyback:100] Out: 1960 [Urine:1750; Chest Tube:210]   Physical Exam:  Cardiovascular: RR Pulmonary: Diminished breath sounds at bases; no rales, wheezes, or rhonchi. Abdomen: Soft, non tender, bowel sounds present. Extremities: No lower extremity edema. Wounds: Dressing is clean and dry.  Chest Tube: to suction and no air leak  Lab Results: CBC: Recent Labs  05/05/12 1403 05/07/12 0355  WBC 8.3 7.6  HGB 13.9 12.2*  HCT 41.2 37.3*  PLT 326 271   BMET:  Recent Labs  05/05/12 1403 05/07/12 0355  NA 141 136  K 3.9 3.6  CL 104 103  CO2 25 28  GLUCOSE 110* 136*  BUN 10 8  CREATININE 0.74 0.71  CALCIUM 9.1 8.3*    PT/INR:  Recent Labs  05/05/12 1403  LABPROT 13.4  INR 1.03   ABG:  INR: Will add last result for INR, ABG once components are confirmed Will add last 4 CBG results once components are confirmed  Assessment/Plan:  1. CV - SR 2.  Pulmonary - Chest tube output 280 since surgery. There is no air leak. CXR this am appears to show low lung volumes, cardiomegaly, pulmonary vascular  congestion, and no pneumothorax.Hope to remove one chest tube soon then place remaining to water seal. Encourage incentive spirometer and flutter valve. 3.Remove a line 4.Remove foley in am 5.Potassium being supllemented  ZIMMERMAN,DONIELLE MPA-C 05/07/2012,7:50 AM  D/c foley D/c anterior ct tube  Ambulate I have seen and examined Eric Stone and agree with the above assessment  and plan.  Delight Ovens MD Beeper [phone] Office [phone] 05/07/2012 8:37 AM

## 2012-05-07 NOTE — Op Note (Signed)
NAMEMarland Stone  TOUSSAINT, GOLSON NO.:  [id number]  MEDICAL RECORD NO.:  [id number]  LOCATION:  3305                         FACILITY:  [facility]  PHYSICIAN:  [name], MD    DATE OF BIRTH:  1945-10-14  DATE OF PROCEDURE:  05/06/2012 DATE OF DISCHARGE:                              OPERATIVE REPORT   PREOPERATIVE DIAGNOSIS:  Postpneumonic left pleural effusion, developing chronic empyema.  POSTOPERATIVE DIAGNOSIS:  Postpneumonic left pleural effusion, developing chronic empyema.  SURGICAL PROCEDURE:  Bronchoscopy, left video-assisted thoracoscopy. Drainage of a complex loculated pericardial effusion with video-assisted thoracoscopy.  SURGEON:  [name], MD.  FIRST ASSISTANT:  [name], [place]  BRIEF HISTORY:  The patient is a 67 year old male, who in early January of this year, developed cough, pneumonia type symptoms.  He was treated with several courses of antibiotics, but persisted in having left chest discomfort.  A followup chest x-ray and CT scan showed a moderate-sized complex left pleural effusion.  The patient's fevers, chills had gradually resolved; however, he is with persistent left pleural effusion and was referred to Dr. Shelle Iron, but because of the character of the fluid and suspicion that it was complex loculated effusion, possible empyema.  He is referred for thoracic surgery for drainage.  Risks and options were discussed with the patient, who agreed with proceeding with VATS and drainage.  DESCRIPTION OF PROCEDURE:  The patient underwent general endotracheal anesthesia without incident.  Through the single-lumen endotracheal tube, a fiberoptic bronchoscope was passed to the soft segmental level without any evidence of endobronchial lesions.  Washings with cultures were obtained.  The scope was then removed.  A double-lumen endotracheal tube was placed after appropriate time-out, confirming the left side. The patient was turned in  lateral decubitus position with the left side up.  The chest was prepped with Betadine and draped in sterile manner to proceed with procedure.  Two port sites, one is the posterior axillary line approximately seventh intercostal space and a second more anterior at approximately the fifth intercostal space were created.  Through these 2 ports, we were able to examine the chest.  There was moderate amount of thin fluid free in the space with multiple loculations in the pleural space.  Between the 2 were sites and with that scope, the loculations were disrupted and the entire pleural effusion was drained. There was not a significant peel developing on the lung at this point. Through the 2 ports, two 28 chest tubes were placed, one through each port site.  The lung was reinflated.  Cultures were obtained on the pleural fluid as well as cytology, cell count, LDH, protein, and glucose.  The patient was then awakened and extubated in the operating room having tolerated the procedure without obvious complication.  Blood loss was minimal.     [name], MD     EG/MEDQ  D:  05/07/2012  T:  05/07/2012  Job:  [id number]  cc:   Barbaraann Share, MD,FCCP

## 2012-05-07 NOTE — Progress Notes (Signed)
Patient's foley d/c'd per MD order, per policy; patient tolerated well.  Instructed patient to drink plenty of fluids because needs to spontaneous void in 6 hours.   Patient's left flank anterior CT d/c'd per MD order per policy; patient tolerated well.  Patient instructed to call if any changes felt or if any SOB occurs.  Left flank posterior CT placed on water-seal per MD order.  Will continue to monitor.

## 2012-05-07 NOTE — Progress Notes (Signed)
Brief Nutrition Note  Malnutrition Screening Tool result is inaccurate.  Please consult if nutrition needs are identified.  Kimberly Harris, RD, LDN, CNSC Pager# [phone] After Hours Pager# [phone]   

## 2012-05-08 ENCOUNTER — Inpatient Hospital Stay (HOSPITAL_COMMUNITY): Payer: Medicare Other

## 2012-05-08 ENCOUNTER — Encounter (HOSPITAL_COMMUNITY): Payer: Self-pay | Admitting: Cardiothoracic Surgery

## 2012-05-08 LAB — COMPREHENSIVE METABOLIC PANEL
Albumin: 2.3 g/dL — ABNORMAL LOW (ref 3.5–5.2)
Alkaline Phosphatase: 84 U/L (ref 39–117)
BUN: 11 mg/dL (ref 6–23)
CO2: 28 mEq/L (ref 19–32)
Chloride: 99 mEq/L (ref 96–112)
GFR calc Af Amer: 71 mL/min — ABNORMAL LOW (ref >90)
GFR calc non Af Amer: 62 mL/min — ABNORMAL LOW (ref >90)
Glucose, Bld: 191 mg/dL — ABNORMAL HIGH (ref 70–99)
Potassium: 4.1 mEq/L (ref 3.5–5.1)
Total Bilirubin: 0.5 mg/dL (ref 0.3–1.2)

## 2012-05-08 LAB — CBC
HCT: 37.2 % — ABNORMAL LOW (ref 39.0–52.0)
Hemoglobin: 12.4 g/dL — ABNORMAL LOW (ref 13.0–17.0)
MCV: 88.4 fL (ref 78.0–100.0)
RBC: 4.21 MIL/uL — ABNORMAL LOW (ref 4.22–5.81)
WBC: 9.4 10*3/uL (ref 4.0–10.5)

## 2012-05-08 LAB — CULTURE, RESPIRATORY W GRAM STAIN

## 2012-05-08 MED ORDER — OXYCODONE-ACETAMINOPHEN 5-325 MG PO TABS: 1.0000 | ORAL_TABLET | ORAL | Status: DC | PRN

## 2012-05-08 NOTE — Progress Notes (Addendum)
2 Days Post-Op Procedure(s) (LRB): VIDEO BRONCHOSCOPY (N/A) VIDEO ASSISTED THORACOSCOPY (VATS) drainage loculated pleural effusion (Left) Subjective:  Eric Stone states he feels okay this morning.  His chest tube site "itches"  Objective: Vital signs in last 24 hours: Temp:  [97.6 F (36.4 C)-99.5 F (37.5 C)] 97.6 F (36.4 C) (02/13 0400) Pulse Rate:  [72-95] 74 (02/13 0000) Cardiac Rhythm:  [-] Normal sinus rhythm (02/13 0400) Resp:  [17-22] 17 (02/13 0000) BP: (122-158)/(69-89) 158/89 mmHg (02/13 0400) SpO2:  [89 %-96 %] 89 % (02/13 0000)  Intake/Output from previous day: 02/12 0701 - 02/13 0700 In: 2256.7 [P.O.:450; I.V.:1806.7] Out: 930 [Urine:800; Chest Tube:130]  General appearance: alert, cooperative and no distress Heart: regular rate and rhythm Lungs: diminished breath sounds bibasilar Abdomen: soft, non-tender; bowel sounds normal; no masses,  no organomegaly Wound: clean and dr  Lab Results:  Recent Labs  05/07/12 0355 05/08/12 0430  WBC 7.6 9.4  HGB 12.2* 12.4*  HCT 37.3* 37.2*  PLT 271 211   BMET:  Recent Labs  05/07/12 0355 05/08/12 0430  NA 136 135  K 3.6 4.1  CL 103 99  CO2 28 28  GLUCOSE 136* 191*  BUN 8 11  CREATININE 0.71 1.21  CALCIUM 8.3* 8.9    PT/INR:  Recent Labs  05/05/12 1403  LABPROT 13.4  INR 1.03   ABG    Component Value Date/Time   PHART 7.443 05/07/2012 0427   HCO3 27.5* 05/07/2012 0427   TCO2 28.8 05/07/2012 0427   O2SAT 90.7 05/07/2012 0427   CBG (last 3)  No results found for this basename: GLUCAP,  in the last 72 hours  Assessment/Plan: S/P Procedure(s) (LRB): VIDEO BRONCHOSCOPY (N/A) VIDEO ASSISTED THORACOSCOPY (VATS) drainage loculated pleural effusion (Left)  1. Chest tube- no air leak, 130cc output yesterday, can likely d/c chest tube will defer to staff 2. Pulm- + atelectasis will continue flutter valve pulm toilet 3. Dispo- chest tube management per staff, if d/c tube today likely ready for d/c in  AM   LOS: 2 days    Lowella Dandy 05/08/2012  Plan ct removal today, poss home tomorrow No growth on culture so far I have seen and examined Doloris Hall and agree with the above assessment  and plan.  Delight Ovens MD Beeper [phone] Office [phone] 05/08/2012 9:45 AM

## 2012-05-08 NOTE — Discharge Summary (Signed)
Physician Discharge Summary  Patient ID: Eric Stone MRN: [id number] DOB/AGE: 10/06/1945 67 y.o.  Admit date: 05/06/2012 Discharge date: 05/08/2012  Admission Diagnoses:  Patient Active Problem List  Diagnosis  . Pleural effusion  . Lower extremity edema   Discharge Diagnoses:   Patient Active Problem List  Diagnosis  . Pleural effusion  . Lower extremity edema   Discharged Condition: good  History of Present Illness:   [name] is a 67 yo white male with no previous respiratory problems until January this year.  At that time he developed cough, malaise, worsening, shortness of breath, and a low grade.  He was evaluated by his PCP and CXR showed an abnormal lower lobe airspace process.  This was diagnosed as Pneumonia and the patient was treated with Levaquin and a Z- Pak.  Patient had follow-up CXR which continued to be abnormal prompting further evaluation with CT scan of the chest which showed a complex loculated significant sized pleural effusion on the left.  Due to this the patient was referred to [facility] for possible intervention.  He was evaluated by Dr. Tyrone Sage on 05/05/2012 at which time the patient stated he had been feeling better after completion of his antibiotics.  He did continue to have a cough with mucous production.  He denies fever, but does have episodes of chills and dyspnea with activity.  He also complains of occasional pleuritic type chest pain on his left side.  The patient's imaging was reviewed and it was decided the patient would best be treated with Bronchoscopy and VATS with drainage and decortication. The risks and benefits of the procedure were explained to the patient and he was agreeable to proceed.  Surgery was scheduled for May 06, 2012.      Hospital Course:   [name] presented to [facility] on 05/06/2012.  He was taken to the operating room and underwent Bronchoscopy, Left VATS with drainage of a loculated pleural effusion.  Specimens  were sent to laboratory and cytology for evaluation.  The patient tolerated the procedure well, was extubated and taken to the PACU in stable condition.  Since surgery the patient has done well.  There has been no air leak in chest tube collection chambers.  His chest tube output has been minimal and his chest tubes have been removed.  We will obtain CXR in the morning and if no evidence of pneumothorax is present we will plan to discharge the patient in the next 24-48 hours.  Results from cultures taken in the operating room have been negative so far.  However, final results are pending.  He will follow up with Dr. Tyrone Sage in 2 weeks with a chest xray prior to his appointment.    Treatments: surgery:   Bronchoscopy, left video-assisted thoracoscopy.  Drainage of a complex loculated pericardial effusion with video-assisted  thoracoscopy.  Disposition: Home    Medication List    TAKE these medications       aspirin 81 MG tablet  Take 81 mg by mouth daily.     oxyCODONE-acetaminophen 5-325 MG per tablet  Commonly known as:  PERCOCET/ROXICET  Take 1-2 tablets by mouth every 4 (four) hours as needed.         SignedLowella Dandy 05/08/2012, 10:39 AM

## 2012-05-08 NOTE — Progress Notes (Signed)
Removed central line per md order following policies and procedures. Pt tolerated well. Dressing applied and patient laying flat for 30 minutes. Will continue to monitor.

## 2012-05-09 ENCOUNTER — Inpatient Hospital Stay (HOSPITAL_COMMUNITY): Payer: Medicare Other

## 2012-05-09 LAB — BODY FLUID CULTURE: Culture: NO GROWTH

## 2012-05-09 NOTE — Progress Notes (Signed)
Dr Tyrone Sage in to see patient and discharged to home.  No complaints

## 2012-05-09 NOTE — Progress Notes (Signed)
Discharge orders given to wife and patient, with teachback.  Questions answered.  Patient wants to see Dr. Tyrone Sage before leaving.  NSL and sutures dc'd. Incision sites no sign of redness or swelling, but has slight shearing from tape.

## 2012-05-09 NOTE — Progress Notes (Addendum)
3 Days Post-Op Procedure(s) (LRB): VIDEO BRONCHOSCOPY (N/A) VIDEO ASSISTED THORACOSCOPY (VATS) drainage loculated pleural effusion (Left) Subjective:  Mr. Mollica is without complaints this morning.    Objective: Vital signs in last 24 hours: Temp:  [97.4 F (36.3 C)-98.6 F (37 C)] 97.4 F (36.3 C) (02/14 0757) Pulse Rate:  [59-86] 59 (02/14 0400) Cardiac Rhythm:  [-] Normal sinus rhythm (02/14 0400) Resp:  [16-28] 16 (02/14 0400) BP: (128-146)/(71-93) 143/93 mmHg (02/14 0400) SpO2:  [91 %-95 %] 91 % (02/14 0400)  Intake/Output from previous day: 02/13 0701 - 02/14 0700 In: 220 [P.O.:180; I.V.:40] Out: 0   General appearance: alert, cooperative and no distress Neurologic: intact Heart: regular rate and rhythm Lungs: diminished breath sounds bibasilar Abdomen: soft, non-tender; bowel sounds normal; no masses,  no organomegaly Wound: clean and dry  Lab Results:  Recent Labs  05/07/12 0355 05/08/12 0430  WBC 7.6 9.4  HGB 12.2* 12.4*  HCT 37.3* 37.2*  PLT 271 211   BMET:  Recent Labs  05/07/12 0355 05/08/12 0430  NA 136 135  K 3.6 4.1  CL 103 99  CO2 28 28  GLUCOSE 136* 191*  BUN 8 11  CREATININE 0.71 1.21  CALCIUM 8.3* 8.9    PT/INR: No results found for this basename: LABPROT, INR,  in the last 72 hours ABG    Component Value Date/Time   PHART 7.443 05/07/2012 0427   HCO3 27.5* 05/07/2012 0427   TCO2 28.8 05/07/2012 0427   O2SAT 90.7 05/07/2012 0427   CBG (last 3)  No results found for this basename: GLUCAP,  in the last 72 hours  Assessment/Plan: S/P Procedure(s) (LRB): VIDEO BRONCHOSCOPY (N/A) VIDEO ASSISTED THORACOSCOPY (VATS) drainage loculated pleural effusion (Left)  1. Chest tube removed yesterday- CXR with no evidence of pneumothorax,  There is a small loculated left pleural effusion and continued atelectasis 2. Cytology- no evidence of malignant cells, pathology referral showed benign mesothelial cells 3. Dispo-patient doing well, will d/c  home today   LOS: 3 days    Raford Pitcher, ERIN 05/09/2012  Patient seen, feels well Home today Follow up in office I have seen and examined Doloris Hall and agree with the above assessment  and plan.  Delight Ovens MD Beeper [phone] Office [phone] 05/09/2012 12:09 PM

## 2012-05-12 ENCOUNTER — Encounter: Payer: Self-pay | Admitting: Pulmonary Disease

## 2012-05-21 ENCOUNTER — Other Ambulatory Visit: Payer: Self-pay | Admitting: *Deleted

## 2012-05-21 DIAGNOSIS — J90 Pleural effusion, not elsewhere classified: Secondary | ICD-10-CM

## 2012-05-22 ENCOUNTER — Encounter: Payer: Self-pay | Admitting: Cardiothoracic Surgery

## 2012-05-22 ENCOUNTER — Ambulatory Visit (INDEPENDENT_AMBULATORY_CARE_PROVIDER_SITE_OTHER): Payer: Self-pay | Admitting: Cardiothoracic Surgery

## 2012-05-22 ENCOUNTER — Ambulatory Visit
Admission: RE | Admit: 2012-05-22 | Discharge: 2012-05-22 | Disposition: A | Discharge status: Home / Self Care | Payer: Medicare Other | Source: Ambulatory Visit | Attending: Cardiothoracic Surgery | Admitting: Cardiothoracic Surgery

## 2012-05-22 VITALS — BP 144/88 | HR 106 | Resp 16 | Ht 70.0 in | Wt 240.0 lb

## 2012-05-22 DIAGNOSIS — J90 Pleural effusion, not elsewhere classified: Secondary | ICD-10-CM

## 2012-05-22 DIAGNOSIS — Z09 Encounter for follow-up examination after completed treatment for conditions other than malignant neoplasm: Secondary | ICD-10-CM

## 2012-05-22 NOTE — Progress Notes (Signed)
[address]            [place] [address]          [phone]       Nilo Fallin [facility] Medical Record #[id number] Date of Birth: Mar 16, 1946  Barbaraann Share, MD Park Pope, MD  Chief Complaint:   PostOp Follow Up Visit 05/06/2012  DATE OF DISCHARGE:  OPERATIVE REPORT  PREOPERATIVE DIAGNOSIS: Postpneumonic left pleural effusion, developing  chronic empyema.  POSTOPERATIVE DIAGNOSIS: Postpneumonic left pleural effusion,  developing chronic empyema.  SURGICAL PROCEDURE: Bronchoscopy, left video-assisted thoracoscopy.  Drainage of a complex loculated pleural  effusion with video-assisted  thoracoscopy.    History of Present Illness:      Patient returns after the drainage of a complex loculated pleural effusion on February 11. Pathology  was negative for malignancy and cultures were negative. The patient's had no recurrent fever chills or cough. He's been gaining strength well.    History  Smoking status  . Former Smoker -- 1.00 packs/day for 26 years  . Types: Cigarettes  . Quit date: 03/26/1989  Smokeless tobacco  . Not on file    Comment: less than 1 PPD       Allergies  Allergen Reactions  . Penicillins Other (See Comments)    As a child    Current Outpatient Prescriptions  Medication Sig Dispense Refill  . aspirin 81 MG tablet Take 81 mg by mouth daily.      Marland Kitchen oxyCODONE-acetaminophen (PERCOCET/ROXICET) 5-325 MG per tablet Take 1-2 tablets by mouth every 4 (four) hours as needed.  50 tablet  0   No current facility-administered medications for this visit.       Physical Exam: BP 144/88  Pulse 106  Resp 16  Ht 5\' 10"  (1.778 m)  Wt 240 lb (108.863 kg)  BMI 34.44 kg/m2  SpO2 97%  General appearance: alert and cooperative Neurologic: intact Heart: regular rate and rhythm, S1, S2 normal, no murmur, click, rub or gallop and normal apical impulse Lungs: clear to auscultation bilaterally and normal  percussion bilaterally Abdomen: soft, non-tender; bowel sounds normal; no masses,  no organomegaly Extremities: extremities normal, atraumatic, no cyanosis or edema and Homans sign is negative, no sign of DVT Wound: Patient's chest tube sites /VATS ports are without infection   Diagnostic Studies & Laboratory data:         Recent Radiology Findings: Dg Chest 2 View  05/22/2012  *RADIOLOGY REPORT*  Clinical Data: Pleural effusion.  CHEST - 2 VIEW  Comparison: Chest x-ray 05/09/2012.  Findings: Moderate left pleural effusion has decreased slightly compared to the prior examination.  Linear opacity in the periphery of the left mid lung may represent an area of atelectasis and/or scarring.  Right lung is clear.  No right pleural effusion. Heart size is normal.  Upper mediastinal contours are unremarkable.  IMPRESSION: 1. Slight decrease in size of moderate left pleural effusion with associated atelectasis and/or scarring in the periphery of the left mid lung.   Original Report Authenticated By: Trudie Reed, M.D.     Recent Labs: Lab Results  Component Value Date   WBC 9.4 05/08/2012   HGB 12.4* 05/08/2012   HCT 37.2* 05/08/2012   PLT 211 05/08/2012   GLUCOSE 191* 05/08/2012   ALT 24 05/08/2012   AST 13 05/08/2012   NA 135 05/08/2012   K 4.1 05/08/2012  CL 99 05/08/2012   CREATININE 1.21 05/08/2012   BUN 11 05/08/2012   CO2 28 05/08/2012   INR 1.03 05/05/2012      Assessment / Plan:   Patient doing well after drainage of loculated left pleural effusion with negative cytology and cultures. Overall the patient is making good progress postoperatively without recurrent symptoms. The plan to see him back again in 4 weeks with a followup chest x-ray, he is aware to call immediately should he have fever chills or increasing cough.         Brock Larmon B 05/22/2012 2:26 PM

## 2012-06-05 LAB — FUNGUS CULTURE W SMEAR
Fungal Smear: NONE SEEN
Fungal Smear: NONE SEEN

## 2012-06-18 LAB — AFB CULTURE WITH SMEAR (NOT AT ARMC)
Acid Fast Smear: NONE SEEN
Acid Fast Smear: NONE SEEN

## 2012-06-19 ENCOUNTER — Other Ambulatory Visit: Payer: Self-pay | Admitting: *Deleted

## 2012-06-19 ENCOUNTER — Encounter: Payer: Self-pay | Admitting: Cardiothoracic Surgery

## 2012-06-19 ENCOUNTER — Ambulatory Visit (INDEPENDENT_AMBULATORY_CARE_PROVIDER_SITE_OTHER): Payer: Self-pay | Admitting: Cardiothoracic Surgery

## 2012-06-19 ENCOUNTER — Ambulatory Visit
Admission: RE | Admit: 2012-06-19 | Discharge: 2012-06-19 | Disposition: A | Discharge status: Home / Self Care | Payer: Medicare Other | Source: Ambulatory Visit | Attending: Cardiothoracic Surgery | Admitting: Cardiothoracic Surgery

## 2012-06-19 VITALS — BP 160/88 | HR 76 | Resp 16 | Ht 70.0 in | Wt 235.0 lb

## 2012-06-19 DIAGNOSIS — J90 Pleural effusion, not elsewhere classified: Secondary | ICD-10-CM

## 2012-06-19 DIAGNOSIS — Z09 Encounter for follow-up examination after completed treatment for conditions other than malignant neoplasm: Secondary | ICD-10-CM

## 2012-06-19 NOTE — Progress Notes (Signed)
[address]            [place] [address]          [phone]       Eric Stone [facility] Medical Record #[id number] Date of Birth: 11/25/1945  Barbaraann Share, MD Park Pope, MD  Chief Complaint:   PostOp Follow Up Visit 05/06/2012  DATE OF DISCHARGE:  OPERATIVE REPORT  PREOPERATIVE DIAGNOSIS: Postpneumonic left pleural effusion, developing  chronic empyema.  POSTOPERATIVE DIAGNOSIS: Postpneumonic left pleural effusion,  developing chronic empyema.  SURGICAL PROCEDURE: Bronchoscopy, left video-assisted thoracoscopy.  Drainage of a complex loculated pleural  effusion with video-assisted  thoracoscopy.    History of Present Illness:      Patient returns after the drainage of a complex loculated pleural effusion on February 11. Pathology  was negative for malignancy and cultures were negative. Patient continues to do well without complaints of shortness of breath. He is returning to near-normal activities without difficulty. He's had no recurrent fever chills.    History  Smoking status  . Former Smoker -- 1.00 packs/day for 26 years  . Types: Cigarettes  . Quit date: 03/26/1989  Smokeless tobacco  . Not on file    Comment: less than 1 PPD       Allergies  Allergen Reactions  . Penicillins Other (See Comments)    As a child    Current Outpatient Prescriptions  Medication Sig Dispense Refill  . aspirin 81 MG tablet Take 81 mg by mouth daily.      Marland Kitchen oxyCODONE-acetaminophen (PERCOCET/ROXICET) 5-325 MG per tablet Take 1-2 tablets by mouth every 4 (four) hours as needed.  50 tablet  0   No current facility-administered medications for this visit.       Physical Exam: BP 160/88  Pulse 76  Resp 16  Ht 5\' 10"  (1.778 m)  Wt 235 lb (106.595 kg)  BMI 33.72 kg/m2  SpO2 96%  General appearance: alert and cooperative Neurologic: intact Heart: regular rate and rhythm, S1, S2 normal, no murmur, click, rub or gallop  and normal apical impulse Lungs: clear to auscultation bilaterally and normal percussion bilaterally Abdomen: soft, non-tender; bowel sounds normal; no masses,  no organomegaly Extremities: extremities normal,  no cyanosis or edema and Homans sign is negative, no sign of DVT Wound: Patient's chest tube sites /VATS ports are without infection   Diagnostic Studies & Laboratory data:         Recent Radiology Findings: Dg Chest 2 View  06/19/2012  *RADIOLOGY REPORT*  Clinical Data: Pleural effusions.  CHEST - 2 VIEW  Comparison: 05/22/2012  Findings: Further decrease in size of the left pleural effusion. Small residual pleural effusion or pleural thickening.  Right lung is clear.  No right effusion.  Heart is normal size.  No acute bony abnormality.  IMPRESSION: Further decreased size of the left effusion with small residual left effusion versus pleural thickening.   Original Report Authenticated By: Charlett Nose, M.D.     Recent Labs: Lab Results  Component Value Date   WBC 9.4 05/08/2012   HGB 12.4* 05/08/2012   HCT 37.2* 05/08/2012   PLT 211 05/08/2012   GLUCOSE 191* 05/08/2012   ALT 24 05/08/2012   AST 13 05/08/2012   NA 135 05/08/2012   K 4.1 05/08/2012   CL 99 05/08/2012   CREATININE 1.21 05/08/2012   BUN 11 05/08/2012  CO2 28 05/08/2012   INR 1.03 05/05/2012      Assessment / Plan:   Patient doing well after drainage of loculated left pleural effusion with negative cytology and cultures. Overall the patient is making good progress postoperatively without recurrent symptoms. The plan to see him back again in 3 months with a followup chest x-ray.   We discussed need for pneumococcal vaccination and keeping up to date with his flu vaccination in the future.   Adisa Vigeant B 06/19/2012 5:57 PM

## 2012-09-25 ENCOUNTER — Ambulatory Visit: Payer: Medicare Other | Admitting: Cardiothoracic Surgery

## 2012-10-15 ENCOUNTER — Other Ambulatory Visit: Payer: Self-pay | Admitting: *Deleted

## 2012-10-15 DIAGNOSIS — J90 Pleural effusion, not elsewhere classified: Secondary | ICD-10-CM

## 2012-10-16 ENCOUNTER — Encounter: Payer: Self-pay | Admitting: Cardiothoracic Surgery

## 2012-10-16 ENCOUNTER — Ambulatory Visit (INDEPENDENT_AMBULATORY_CARE_PROVIDER_SITE_OTHER): Payer: Medicare Other | Admitting: Cardiothoracic Surgery

## 2012-10-16 ENCOUNTER — Ambulatory Visit
Admission: RE | Admit: 2012-10-16 | Discharge: 2012-10-16 | Disposition: A | Discharge status: Home / Self Care | Payer: Medicare Other | Source: Ambulatory Visit | Attending: Cardiothoracic Surgery | Admitting: Cardiothoracic Surgery

## 2012-10-16 VITALS — BP 162/99 | HR 76 | Resp 18 | Ht 70.0 in | Wt 244.5 lb

## 2012-10-16 DIAGNOSIS — J90 Pleural effusion, not elsewhere classified: Secondary | ICD-10-CM

## 2012-10-16 DIAGNOSIS — Z0271 Encounter for disability determination: Secondary | ICD-10-CM

## 2012-10-16 NOTE — Progress Notes (Signed)
[address]       [place] [address]             [phone]            Amanuel Sinkfield [facility] Medical Record #[id number] Date of Birth: 08-04-1945  Barbaraann Share, MD Park Pope, MD  Chief Complaint:   PostOp Follow Up Visit 05/06/2012  DATE OF DISCHARGE:  OPERATIVE REPORT  PREOPERATIVE DIAGNOSIS: Postpneumonic left pleural effusion, developing  chronic empyema.  POSTOPERATIVE DIAGNOSIS: Postpneumonic left pleural effusion,  developing chronic empyema.  SURGICAL PROCEDURE: Bronchoscopy, left video-assisted thoracoscopy.  Drainage of a complex loculated pleural  effusion with video-assisted  thoracoscopy.    History of Present Illness:      Patient returns after the drainage of a complex loculated pleural effusion on February 11. Pathology  was negative for malignancy and cultures were negative. Patient continues to do well without complaints of shortness of breath. He is returning to near-normal activities without difficulty. He's had no recurrent fever chills. Patient returns today for followup chest x-ray.    History  Smoking status  . Former Smoker -- 1.00 packs/day for 26 years  . Types: Cigarettes  . Quit date: 03/26/1989  Smokeless tobacco  . Not on file    Comment: less than 1 PPD       Allergies  Allergen Reactions  . Penicillins Other (See Comments)    As a child    Current Outpatient Prescriptions  Medication Sig Dispense Refill  . aspirin 81 MG tablet Take 81 mg by mouth daily.       No current facility-administered medications for this visit.       Physical Exam: BP 162/99  Pulse 76  Resp 18  Ht 5\' 10"  (1.778 m)  Wt 244 lb 8 oz (110.904 kg)  BMI 35.08 kg/m2  SpO2 96%  General appearance: alert and cooperative Neurologic: intact Heart: regular rate and rhythm, S1, S2 normal, no murmur, click, rub or gallop and normal apical impulse Lungs: clear to auscultation bilaterally and normal  percussion bilaterally Abdomen: soft, non-tender; bowel sounds normal; no masses,  no organomegaly Extremities: extremities normal,  no cyanosis or edema and Homans sign is negative, no sign of DVT Wound: Patient's incisions are well-healed   Diagnostic Studies & Laboratory data:         Recent Radiology Findings: Dg Chest 2 View  10/16/2012   *RADIOLOGY REPORT*  Clinical Data: History of pleural effusion  CHEST - 2 VIEW  Comparison: Chest x-ray of 06/19/2012  Findings: The small left pleural effusion has resolved.  No infiltrate or effusion is currently seen.  Mediastinal contours are stable.  The heart is within upper limits of normal.  There are mild degenerative changes in the thoracic spine.  IMPRESSION: No active lung disease.  No left pleural effusion is seen.   Original Report Authenticated By: Dwyane Dee, M.D.    Recent Labs: Lab Results  Component Value Date   WBC 9.4 05/08/2012   HGB 12.4* 05/08/2012   HCT 37.2* 05/08/2012   PLT 211 05/08/2012   GLUCOSE 191* 05/08/2012   ALT 24 05/08/2012   AST 13 05/08/2012   NA 135 05/08/2012   K 4.1 05/08/2012   CL 99 05/08/2012   CREATININE 1.21 05/08/2012   BUN 11 05/08/2012   CO2 28 05/08/2012   INR 1.03 05/05/2012  Assessment / Plan:   Patient doing well after drainage of loculated left pleural effusion with negative cytology and cultures. Overall the patient is making good progress postoperatively without recurrent symptoms, or evidence of recurrent pleural effusion or lung entrapment I stressed to the patient the importance of him to keep up to date on his flu and pneumococcal vaccinations, he notes that he has a routine annual physical coming up soon with Dr. Ricka Burdock B 10/16/2012 2:41 PM

## 2012-10-29 ENCOUNTER — Other Ambulatory Visit: Payer: Self-pay

## 2013-01-22 ENCOUNTER — Ambulatory Visit: Payer: Self-pay | Admitting: Gastroenterology

## 2013-01-29 ENCOUNTER — Other Ambulatory Visit: Payer: Self-pay

## 2013-06-18 DIAGNOSIS — Z86018 Personal history of other benign neoplasm: Secondary | ICD-10-CM

## 2013-06-18 HISTORY — DX: Personal history of other benign neoplasm: Z86.018

## 2013-07-16 DIAGNOSIS — R972 Elevated prostate specific antigen [PSA]: Secondary | ICD-10-CM | POA: Insufficient documentation

## 2013-07-17 DIAGNOSIS — N400 Enlarged prostate without lower urinary tract symptoms: Secondary | ICD-10-CM | POA: Insufficient documentation

## 2013-10-26 ENCOUNTER — Ambulatory Visit: Payer: Self-pay | Admitting: Radiation Oncology

## 2013-11-24 ENCOUNTER — Ambulatory Visit: Payer: Self-pay | Admitting: Radiation Oncology

## 2013-12-03 LAB — CBC CANCER CENTER
BASOS ABS: 0.1 x10 3/mm (ref 0.0–0.1)
BASOS PCT: 0.9 %
EOS PCT: 4.8 %
Eosinophil #: 0.3 x10 3/mm (ref 0.0–0.7)
HCT: 43.7 % (ref 40.0–52.0)
HGB: 14.6 g/dL (ref 13.0–18.0)
Lymphocyte #: 1.6 x10 3/mm (ref 1.0–3.6)
Lymphocyte %: 25.5 %
MCH: 30.2 pg (ref 26.0–34.0)
MCHC: 33.3 g/dL (ref 32.0–36.0)
MCV: 91 fL (ref 80–100)
MONO ABS: 0.5 x10 3/mm (ref 0.2–1.0)
Monocyte %: 7.7 %
Neutrophil #: 3.9 x10 3/mm (ref 1.4–6.5)
Neutrophil %: 61.1 %
Platelet: 202 x10 3/mm (ref 150–440)
RBC: 4.83 10*6/uL (ref 4.40–5.90)
RDW: 13.6 % (ref 11.5–14.5)
WBC: 6.4 x10 3/mm (ref 3.8–10.6)

## 2013-12-10 LAB — CBC CANCER CENTER
Basophil #: 0 x10 3/mm (ref 0.0–0.1)
Basophil %: 0.7 %
EOS ABS: 0.3 x10 3/mm (ref 0.0–0.7)
Eosinophil %: 4 %
HCT: 43.2 % (ref 40.0–52.0)
HGB: 14.5 g/dL (ref 13.0–18.0)
LYMPHS ABS: 1.4 x10 3/mm (ref 1.0–3.6)
Lymphocyte %: 20.5 %
MCH: 30.5 pg (ref 26.0–34.0)
MCHC: 33.5 g/dL (ref 32.0–36.0)
MCV: 91 fL (ref 80–100)
Monocyte #: 0.6 x10 3/mm (ref 0.2–1.0)
Monocyte %: 8.6 %
NEUTROS PCT: 66.2 %
Neutrophil #: 4.4 x10 3/mm (ref 1.4–6.5)
Platelet: 177 x10 3/mm (ref 150–440)
RBC: 4.75 10*6/uL (ref 4.40–5.90)
RDW: 13.3 % (ref 11.5–14.5)
WBC: 6.7 x10 3/mm (ref 3.8–10.6)

## 2013-12-17 LAB — CBC CANCER CENTER
BASOS ABS: 0 x10 3/mm (ref 0.0–0.1)
Basophil %: 0.7 %
Eosinophil #: 0.2 x10 3/mm (ref 0.0–0.7)
Eosinophil %: 3.5 %
HCT: 44.1 % (ref 40.0–52.0)
HGB: 14.6 g/dL (ref 13.0–18.0)
LYMPHS ABS: 1.2 x10 3/mm (ref 1.0–3.6)
Lymphocyte %: 17.3 %
MCH: 29.9 pg (ref 26.0–34.0)
MCHC: 33.1 g/dL (ref 32.0–36.0)
MCV: 90 fL (ref 80–100)
Monocyte #: 0.6 x10 3/mm (ref 0.2–1.0)
Monocyte %: 9.5 %
NEUTROS PCT: 69 %
Neutrophil #: 4.7 x10 3/mm (ref 1.4–6.5)
PLATELETS: 171 x10 3/mm (ref 150–440)
RBC: 4.89 10*6/uL (ref 4.40–5.90)
RDW: 13.6 % (ref 11.5–14.5)
WBC: 6.8 x10 3/mm (ref 3.8–10.6)

## 2013-12-24 ENCOUNTER — Ambulatory Visit: Payer: Self-pay | Admitting: Radiation Oncology

## 2013-12-24 LAB — CBC CANCER CENTER
BASOS PCT: 0.5 %
Basophil #: 0 x10 3/mm (ref 0.0–0.1)
EOS PCT: 2.6 %
Eosinophil #: 0.2 x10 3/mm (ref 0.0–0.7)
HCT: 43.6 % (ref 40.0–52.0)
HGB: 14.5 g/dL (ref 13.0–18.0)
LYMPHS ABS: 1 x10 3/mm (ref 1.0–3.6)
Lymphocyte %: 13.5 %
MCH: 30 pg (ref 26.0–34.0)
MCHC: 33.1 g/dL (ref 32.0–36.0)
MCV: 91 fL (ref 80–100)
Monocyte #: 0.7 x10 3/mm (ref 0.2–1.0)
Monocyte %: 9.2 %
Neutrophil #: 5.6 x10 3/mm (ref 1.4–6.5)
Neutrophil %: 74.2 %
PLATELETS: 179 x10 3/mm (ref 150–440)
RBC: 4.82 10*6/uL (ref 4.40–5.90)
RDW: 13.7 % (ref 11.5–14.5)
WBC: 7.5 x10 3/mm (ref 3.8–10.6)

## 2013-12-31 LAB — CBC CANCER CENTER
Basophil #: 0 x10 3/mm (ref 0.0–0.1)
Basophil %: 0.7 %
Eosinophil #: 0.2 x10 3/mm (ref 0.0–0.7)
Eosinophil %: 3.6 %
HCT: 43.6 % (ref 40.0–52.0)
HGB: 14.5 g/dL (ref 13.0–18.0)
LYMPHS PCT: 14.9 %
Lymphocyte #: 1 x10 3/mm (ref 1.0–3.6)
MCH: 30 pg (ref 26.0–34.0)
MCHC: 33.2 g/dL (ref 32.0–36.0)
MCV: 90 fL (ref 80–100)
Monocyte #: 0.7 x10 3/mm (ref 0.2–1.0)
Monocyte %: 10.9 %
NEUTROS ABS: 4.6 x10 3/mm (ref 1.4–6.5)
Neutrophil %: 69.9 %
Platelet: 188 x10 3/mm (ref 150–440)
RBC: 4.82 10*6/uL (ref 4.40–5.90)
RDW: 13.3 % (ref 11.5–14.5)
WBC: 6.6 x10 3/mm (ref 3.8–10.6)

## 2014-01-07 LAB — CBC CANCER CENTER
BASOS ABS: 0 x10 3/mm (ref 0.0–0.1)
Basophil %: 0.6 %
Eosinophil #: 0.2 x10 3/mm (ref 0.0–0.7)
Eosinophil %: 3.7 %
HCT: 42.4 % (ref 40.0–52.0)
HGB: 14.5 g/dL (ref 13.0–18.0)
LYMPHS ABS: 0.9 x10 3/mm — AB (ref 1.0–3.6)
Lymphocyte %: 14.2 %
MCH: 30.5 pg (ref 26.0–34.0)
MCHC: 34.1 g/dL (ref 32.0–36.0)
MCV: 90 fL (ref 80–100)
MONOS PCT: 9.6 %
Monocyte #: 0.6 x10 3/mm (ref 0.2–1.0)
NEUTROS PCT: 71.9 %
Neutrophil #: 4.4 x10 3/mm (ref 1.4–6.5)
Platelet: 189 x10 3/mm (ref 150–440)
RBC: 4.74 10*6/uL (ref 4.40–5.90)
RDW: 13.5 % (ref 11.5–14.5)
WBC: 6.1 x10 3/mm (ref 3.8–10.6)

## 2014-01-14 LAB — CBC CANCER CENTER
Basophil #: 0 x10 3/mm (ref 0.0–0.1)
Basophil %: 0.5 %
EOS ABS: 0.2 x10 3/mm (ref 0.0–0.7)
Eosinophil %: 3.6 %
HCT: 43.1 % (ref 40.0–52.0)
HGB: 14.4 g/dL (ref 13.0–18.0)
LYMPHS ABS: 0.7 x10 3/mm — AB (ref 1.0–3.6)
Lymphocyte %: 12.6 %
MCH: 30.4 pg (ref 26.0–34.0)
MCHC: 33.4 g/dL (ref 32.0–36.0)
MCV: 91 fL (ref 80–100)
MONO ABS: 0.5 x10 3/mm (ref 0.2–1.0)
Monocyte %: 7.9 %
Neutrophil #: 4.3 x10 3/mm (ref 1.4–6.5)
Neutrophil %: 75.4 %
Platelet: 176 x10 3/mm (ref 150–440)
RBC: 4.72 10*6/uL (ref 4.40–5.90)
RDW: 13.3 % (ref 11.5–14.5)
WBC: 5.8 x10 3/mm (ref 3.8–10.6)

## 2014-01-24 ENCOUNTER — Ambulatory Visit: Payer: Self-pay | Admitting: Radiation Oncology

## 2014-03-03 ENCOUNTER — Ambulatory Visit: Payer: Self-pay | Admitting: Radiation Oncology

## 2014-07-17 NOTE — Consult Note (Signed)
Reason for Visit: This 69 year old Male patient presents to the clinic for initial evaluation of  prostate cancer .   Referred by Dr. Bernardo Heater.  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 bone scan   Referral Report clinical notes reviewed   Planned Treatment Regimen image guided IM RT radiation   HPI   patient is a pleasant 69 year old male who presented with a PSA of 4.8. This prompted transrectal ultrasound-guided biopsy showing 7 of 12 cores positive for Gleason 6 adenocarcinoma (3+3). One core biopsy showed Gleason 7 (3+4). He's been seen at [facility] and treatment options have been discussed with the patient and he is opted for radiation therapy. He seen today for discussion of radiation therapy closer to home. Patient does have a rectal dysfunction specifically denies urinary frequency urgency or nocturia. He is having no bone pain.  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   varicose veins:   Past, Family and Social History:  Past Medical History positive   Cardiovascular hypertension   Respiratory pneumonia; sleep apnea, pneumonia and necessitating open drainage   Gastrointestinal hemorrhoids   Past Surgical History bilateral total knee replacement   Past Medical History Comments varicose veins, arthritis   Family History positive   Family History Comments mother with diabetes and CVA have a history also of lung cancer and hypertension   Social History positive   Social History Comments greater than 30-pack-year smoking history has quit smoking no EtOH abuse history   Additional Past Medical and Surgical History accompanied by his wife today   Allergies:   PCN:  Unknown  Home Meds:  Home Medications: Medication Instructions Status  aspirin 81 mg oral enteric coated tablet 1 tab(s) PO once a day AM Active  lisinopril 20 mg oral tablet 1 tab(s) orally once a day Active   Review of Systems:  General negative   Performance Status (ECOG) 0   Skin negative   Breast negative   Ophthalmologic negative   ENMT negative   Respiratory and Thorax negative   Cardiovascular negative   Gastrointestinal negative   Genitourinary see HPI   Musculoskeletal negative   Neurological negative   Psychiatric negative   Hematology/Lymphatics negative   Endocrine negative   Allergic/Immunologic negative   Review of Systems   review of systems obtained from nurses notes  Nursing Notes:  Nursing Vital Signs and Chemo Nursing Nursing Notes: *CC Vital Signs Flowsheet:   03-Aug-15 15:42  Temp Temperature 97.5  Pulse Pulse 66  Respirations Respirations 21  SBP SBP 157  DBP DBP 94  Current Weight (kg) (kg) 110.5   Physical Exam:  General/Skin/HEENT:  General normal   Skin normal   Eyes normal   ENMT normal   Head and Neck normal   Additional PE well-developed slightly obese male in NAD. Lungs are clear to A&P cardiac examination shows regular rate and rhythm abdomen is benign with no organomegaly or masses noted. On rectal exam rectal sphincter tone is good. Prostate is small proximally 35 ml. Sulcus is preserved bilaterally. Seminal vesicle region appears within normal limits. No other rectal abnormality is identified.   Breasts/Resp/CV/GI/GU:  Respiratory and Thorax normal   Cardiovascular  normal   Gastrointestinal normal   Genitourinary normal   MS/Neuro/Psych/Lymph:  Musculoskeletal normal   Neurological normal   Lymphatics normal   Other Results:  Radiology Results: LabUnknown:    04-Feb-14 16:30, CT Chest Without Contrast  PACS Image   CT:  CT Chest Without Contrast   REASON FOR EXAM:    persistent  pneumonia  COMMENTS:       PROCEDURE: KCT - KCT CHEST WITHOUT CONTRAST  - Apr 29 2012  4:30PM     RESULT: History: Pneumonia.    Comparison Study: Prior chest x-ray 04/17/2012.     Findings: Left lower lobe infiltrate ispresent consistent with   pneumonia. Left-sided pleural effusion is present. Atelectasis present in   the lung base. Large airways are patent. Right lung is clear. Nonspecific   2 mm nodule in the left upper lobe. Calcified nodule in the right upper   lobe is present consistent with granuloma. Tiny nonspecific noncalcified   nodule noted in the lingula. These tiny nodules can be followed. Mild     bilateral adrenal fullness noted. Small mediastinal nonspecific lymph   nodes.    IMPRESSION:    1. Left lower lobe infiltrate and atelectasis with pneumonia. Left-sided   pleural effusion.  2. Tiny pulmonary nodules. These are nonspecific and most likely   represent granulomas. Followup chest CT is suggested to demonstrate   clearing of the above described findings.        Verified By: Osa Craver, M.D., MD   Assessment and Plan: Impression:   stage I adenocarcinoma of the prostate in 68 year old male Plan:   at this time patient agreed to go forward with IM RT radiation therapy to his prostate. Would plan on delivering 8000 cGy over 8 weeks using image guided treatment planning and delivery. I've asked Dr. Bernardo Heater to place gold fiduciary markers in his prostate for daily image guided treatment. Risks and efits of treatment including possible diarrhea, possible dysuria including frequency urgency and nocturia, and fatigue were all explained in detail to the patient and his wife. They both seem to comprehend my treatment plan well. Will schedule his marker placement and CT simulation shortly thereafter. Based on his overall parameters he is at very low risk for extracapsular extension of disease by [facility] nomogram. Do not feel he is in need of Lupron  therapy. All this was explained in detail with the patient and they have consented to treatment.  I would like to take this opportunity for allowing me to participate in the care of your patient..  Fax to Physician:  Physicians To Recieve Fax: Larene Beach, MD - [phone] Abbie Sons - [phone].  Electronic Signatures: Armstead Peaks (MD)  (Signed 03-Aug-15 16:41)  Authored: HPI, Diagnosis, Past Hx, PFSH, Allergies, Home Meds, ROS, Nursing Notes, Physical Exam, Other Results, Encounter Assessment and Plan, Fax to Physician   Last Updated: 03-Aug-15 16:41 by Armstead Peaks (MD)

## 2014-07-22 ENCOUNTER — Ambulatory Visit
Admit: 2014-07-22 | Disposition: A | Discharge status: Home / Self Care | Payer: Self-pay | Attending: Radiation Oncology | Admitting: Radiation Oncology

## 2014-07-23 LAB — PSA: PSA: 0.4 ng/mL (ref 0.0–4.0)

## 2014-11-15 ENCOUNTER — Other Ambulatory Visit: Payer: Self-pay | Admitting: Family Medicine

## 2014-12-31 ENCOUNTER — Encounter: Payer: Self-pay | Admitting: Family Medicine

## 2014-12-31 ENCOUNTER — Ambulatory Visit (INDEPENDENT_AMBULATORY_CARE_PROVIDER_SITE_OTHER): Payer: PPO | Admitting: Family Medicine

## 2014-12-31 VITALS — BP 152/69 | HR 58 | Resp 16 | Ht 71.0 in | Wt 235.4 lb

## 2014-12-31 DIAGNOSIS — Z0000 Encounter for general adult medical examination without abnormal findings: Secondary | ICD-10-CM | POA: Diagnosis not present

## 2014-12-31 DIAGNOSIS — Z23 Encounter for immunization: Secondary | ICD-10-CM | POA: Diagnosis not present

## 2014-12-31 NOTE — Progress Notes (Signed)
Patient: Eric Luczak., Male    DOB: [birth year], 69 y.o.   MRN: [id number] Visit Date: 12/31/2014  Today's Provider: Dicky Doe, MD   Chief Complaint  Patient presents with  . Medicare Wellness    Subjective:    Annual wellness visit Eric Seales. is a 69 y.o. male who presents today for his Subsequent Annual Wellness Visit. He feels well. He reports exercising . He reports he is sleeping well.   ----------------------------------------------------------- HPI  Review of Systems  Social History   Social History  . Marital Status: Married    Spouse Name: N/A  . Number of Children: N/A  . Years of Education: N/A   Occupational History  . retired    Social History Main Topics  . Smoking status: Former Smoker -- 1.00 packs/day for 26 years    Types: Cigarettes    Quit date: 03/26/1989  . Smokeless tobacco: Never Used     Comment: less than 1 PPD  . Alcohol Use: No  . Drug Use: No  . Sexual Activity: Yes   Other Topics Concern  . Not on file   Social History Narrative    Patient Active Problem List   Diagnosis Date Noted  . Other symptoms and signs involving the genitourinary system 07/17/2013  . Benign prostatic hypertrophy without urinary obstruction 07/17/2013  . Elevated prostate specific antigen (PSA) 07/16/2013  . Pleural effusion 04/30/2012  . Lower extremity edema 04/30/2012  . 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 Adiposity 02/13/2011    Past Surgical History  Procedure Laterality Date  . 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 Video bronchoscopy N/A 05/06/2012    Procedure: VIDEO BRONCHOSCOPY;  Surgeon: Grace Isaac, MD;  Location: [facility] OR;  Service: Thoracic;  Laterality: N/A;  . Video assisted thoracoscopy (vats)/decortication Left 05/06/2012    Procedure: VIDEO ASSISTED THORACOSCOPY (VATS) drainage loculated pleural effusion;  Surgeon: Grace Isaac, MD;  Location: [place];  Service: Thoracic;  Laterality: Left;     His family history includes Hypertension in his mother; Stroke in his mother.    Previous Medications   ASPIRIN EC 81 MG TABLET    Take 81 mg by mouth.   LISINOPRIL (PRINIVIL,ZESTRIL) 20 MG TABLET    Take 30 mg by mouth. Take 1, 30 mg. Tab each day.   PIMECROLIMUS (ELIDEL) 1 % CREAM    Apply topically.    Patient Care Team: Arlis Porta., MD as PCP - General (Unknown Physician Specialty) Arlis Porta., MD (Unknown Physician Specialty) Grace Isaac, MD as Consulting Physician (Cardiothoracic Surgery) Kathee Delton, MD as Consulting Physician (Pulmonary Disease) Grace Isaac, MD as Consulting Physician (Cardiothoracic Surgery) Kathee Delton, MD as Consulting Physician (Pulmonary Disease)     Objective:   Vitals: BP 152/69 mmHg  Pulse 58  Resp 16  Ht 5\' 11"  (1.803 m)  Wt 235 lb 6.4 oz (106.777 kg)  BMI 32.85 kg/m2  Physical Exam  Activities of Daily Living In your present state of health, do you have any difficulty performing the following activities: 12/31/2014  Hearing? N  Vision? N  Difficulty concentrating or making decisions? N  Walking or climbing stairs? N  Dressing or bathing? N  Doing errands, shopping? N  Preparing Food and eating ? N  Using the Toilet? N  In the past six months, have you accidently leaked urine? N  Do you have problems with loss of bowel control?  N  Managing your Medications? N  Managing your Finances? N  Housekeeping or managing your Housekeeping? N    Fall Risk Assessment Fall Risk  12/31/2014  Falls in the past year? No     Patient reports there are safety devices in place in shower at home.   Depression Screen PHQ 2/9 Scores 12/31/2014  PHQ - 2 Score 0     MMSE MMSE - Mini Mental State Exam 12/31/2014  Orientation to time 5  Orientation to Place 5  Registration 3  Attention/ Calculation 5  Recall 3  Language- name 2 objects 2  Language- repeat 1  Language- follow 3 step command 3  Language- read  & follow direction 1  Write a sentence 1  Copy design 1  Total score 30     Assessment & Plan:     Annual Wellness Visit  Reviewed patient's Family Medical History Reviewed and updated list of patient's medical providers Assessment of cognitive impairment was done Assessed patient's functional ability Established a written schedule for health screening [place] Completed and Reviewed  Exercise Activities and Dietary recommendations Goals    . Exercise 3x per week (30 min per time)     Will increase execise and eat more veggies.        Immunization History  Administered Date(s) Administered  . Influenza, High Dose Seasonal PF 12/31/2014  . Pneumococcal Conjugate-13 01/07/2014  . Pneumococcal Polysaccharide-23 03/26/2012  . Tdap 06/07/2014    Health Maintenance  Topic Date Due  . Hepatitis C Screening  01/23/1946  . ZOSTAVAX  05/10/2007  . INFLUENZA VACCINE  10/25/2015  . COLONOSCOPY  12/25/2022  . TETANUS/TDAP  06/06/2024  . PNA vac Low Risk Adult  Completed     Discussed health benefits of physical activity, and encouraged him to engage in regular exercise appropriate for his age and condition.    ------------------------------------------------------------------------------------------------------------   Problem List Items Addressed This Visit    None    Visit Diagnoses    Need for influenza vaccination    -  Primary    Relevant Orders    Flu vaccine HIGH DOSE PF (Fluzone High dose) (Completed)        Larene Beach, MD [facility]  12/31/2014

## 2014-12-31 NOTE — Patient Instructions (Signed)
Health Maintenance  Topic Date Due  . Hepatitis C Screening  1945/08/12  . ZOSTAVAX  05/10/2007  . INFLUENZA VACCINE  10/25/2015  . COLONOSCOPY  12/25/2022  . TETANUS/TDAP  06/06/2024  . PNA vac Low Risk Adult  Completed

## 2015-01-20 ENCOUNTER — Ambulatory Visit: Payer: Medicare Other | Admitting: Radiation Oncology

## 2015-01-24 ENCOUNTER — Encounter: Payer: Self-pay | Admitting: Emergency Medicine

## 2015-01-24 ENCOUNTER — Ambulatory Visit
Admission: EM | Admit: 2015-01-24 | Discharge: 2015-01-24 | Disposition: A | Discharge status: Home / Self Care | Payer: PPO | Attending: Family Medicine | Admitting: Family Medicine

## 2015-01-24 ENCOUNTER — Ambulatory Visit: Payer: PPO

## 2015-01-24 DIAGNOSIS — J40 Bronchitis, not specified as acute or chronic: Secondary | ICD-10-CM

## 2015-01-24 MED ORDER — AZITHROMYCIN 250 MG PO TABS: ORAL_TABLET | ORAL | Status: DC

## 2015-01-24 MED ORDER — BENZONATATE 100 MG PO CAPS: 100.0000 mg | ORAL_CAPSULE | Freq: Three times a day (TID) | ORAL | Status: DC | PRN

## 2015-01-24 NOTE — Discharge Instructions (Signed)
Take medication as prescribed. Rest.   Follow up with your primary care physician next week. Return to Urgent care or go to ER for chest pain, shortness of breath, new or worsening concerns.

## 2015-01-24 NOTE — ED Notes (Signed)
Patient c/o cough, chest congestion, and runny nose for 2 weeks. Patient denies fevers.

## 2015-01-24 NOTE — ED Provider Notes (Signed)
[facility] Emergency Department Provider Note  ____________________________________________  Time seen: Approximately 11:21 AM  I have reviewed the triage vital signs and the nursing notes.   HISTORY  Chief Complaint Cough and Nasal Congestion   HPI Eric Stone. is a 69 y.o. male presents with a complaint of 2 weeks of runny nose, cough and congestion. States runny nose and congestion has improved however still with cough. States he is frequently coughing up thick sputum occasionally yellowish color. Denies fever at home. Reports has continued to eat and drink well. Denies pain. States he wanted to make sure he does not have pneumonia. States that he did have pneumonia a few years ago.  Denies chest pain, shortness of breath, abdominal pain, back pain, fever, leg pain, weakness or dizziness. Denies recent sickness or recent antibiotic use.Denies exertional shortness of breath.  Primary care physician Dr. Luan Pulling   Past Medical History  Diagnosis Date  . H/O blood clots   . PONV (postoperative nausea and vomiting)   . Sleep apnea     uses a CPAP;sleep study >21yrs ago  . Pneumonia     recently treated   . Arthritis   . Rash     occasionally and pt does see a dermatologist  . H/O blood clots     in left leg in 1988  . Hypertension     Patient Active Problem List   Diagnosis Date Noted  . Other symptoms and signs involving the genitourinary system 07/17/2013  . Benign prostatic hypertrophy without urinary obstruction 07/17/2013  . Elevated prostate specific antigen (PSA) 07/16/2013  . Pleural effusion 04/30/2012  . Lower extremity edema 04/30/2012  . 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 Adiposity 02/13/2011    Past Surgical History  Procedure Laterality Date  . 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 Video bronchoscopy N/A 05/06/2012    Procedure: VIDEO BRONCHOSCOPY;  Surgeon: Grace Isaac, MD;  Location: [facility] OR;  Service:  Thoracic;  Laterality: N/A;  . Video assisted thoracoscopy (vats)/decortication Left 05/06/2012    Procedure: VIDEO ASSISTED THORACOSCOPY (VATS) drainage loculated pleural effusion;  Surgeon: Grace Isaac, MD;  Location: [place];  Service: Thoracic;  Laterality: Left;    Current Outpatient Rx  Name  Route  Sig  Dispense  Refill  . aspirin EC 81 MG tablet   Oral   Take 81 mg by mouth.         Marland Kitchen lisinopril (PRINIVIL,ZESTRIL) 20 MG tablet   Oral   Take 30 mg by mouth. Take 1, 30 mg. Tab each day.         . pimecrolimus (ELIDEL) 1 % cream   Topical   Apply topically.           Allergies Penicillins  Family History  Problem Relation Age of Onset  . Hypertension Mother   . Stroke Mother     possible??    Social History Social History  Substance Use Topics  . Smoking status: Former Smoker -- 1.00 packs/day for 26 years    Types: Cigarettes    Quit date: 03/26/1989  . Smokeless tobacco: Never Used     Comment: less than 1 PPD  . Alcohol Use: No    Review of Systems Constitutional: No fever/chills Eyes: No visual changes. ENT: No sore throat. Positive runny nose, cough, congestion. Cardiovascular: Denies chest pain. Respiratory: Denies shortness of breath. Gastrointestinal: No abdominal pain.  No nausea, no vomiting.  No diarrhea.  No constipation. Genitourinary: Negative for dysuria. Musculoskeletal: Negative for back pain. Skin: Negative for rash. Neurological: Negative for headaches, focal weakness or numbness.  10-point ROS otherwise negative.  ____________________________________________   PHYSICAL EXAM:  VITAL SIGNS: ED Triage Vitals  Enc Vitals Group     BP 01/24/15 1011 136/71 mmHg     Pulse Rate 01/24/15 1011 60     Resp 01/24/15 1011 17     Temp 01/24/15 1011 97.1 F (36.2 C)     Temp Source 01/24/15 1011 Tympanic     SpO2 01/24/15 1011 98 %     Weight 01/24/15 1011 235 lb (106.595 kg)     Height 01/24/15 1011 5\' 9"  (1.753 m)     Head  Cir --      Peak Flow --      Pain Score --      Pain Loc --      Pain Edu? --      Excl. in [place]? --     Constitutional: Alert and oriented. Well appearing and in no acute distress. Eyes: Conjunctivae are normal. PERRL. EOMI. Head: Atraumatic. Mild tenderness to palpation bilateral maxillary sinuses, nontender frontal sinuses, no swelling or erythema.   Ears: no erythema, normal TMs bilaterally.   Nose: clear rhinorrhea, nasal congestion, nares patent.   Mouth/Throat: Mucous membranes are moist.  Oropharynx non-erythematous. No tonsillar swelling or exudate.  Neck: No stridor.  No cervical spine tenderness to palpation. Hematological/Lymphatic/Immunilogical: No cervical lymphadenopathy. Cardiovascular: Normal rate, regular rhythm. Grossly normal heart sounds.  Good peripheral circulation. Respiratory: Normal respiratory effort.  No retractions. No wheezes or rales. Mild scattered rhonchi. Mild intermittent dry cough in room. Speaking in complete sentences.  Gastrointestinal: Soft and nontender. No distention. Normal Bowel sounds.  No abdominal bruits. No CVA tenderness. Musculoskeletal: No lower or upper extremity tenderness. Bilateral lower extremities mild edema, per patient chronic and unchanged. No calf tenderness bilaterally.   No joint effusions. Bilateral pedal pulses equal and easily palpated.  Neurologic:  Normal speech and language. No gross focal neurologic deficits are appreciated. No gait instability. Skin:  Skin is warm, dry and intact. No rash noted. Psychiatric: Mood and affect are normal. Speech and behavior are normal.  ____________________________________________   LABS (all labs ordered are listed, but only abnormal results are displayed)  Labs Reviewed - No data to display  RADIOLOGY   EXAM: CHEST 2 VIEW  COMPARISON: 10/16/2012  FINDINGS: The heart size and mediastinal contours are within normal limits. Both lungs are clear. The visualized skeletal  structures are unremarkable.  IMPRESSION: No active cardiopulmonary disease.   Electronically Signed By: Lahoma Crocker M.D. On: 01/24/2015 11:36  I, Marylene Land, personally viewed and evaluated these images (plain radiographs) as part of my medical decision making.     INITIAL IMPRESSION / ASSESSMENT AND PLAN / ED COURSE  Pertinent labs & imaging results that were available during my care of the patient were reviewed by me and considered in my medical decision making (see chart for details).  Presents for the complaint of 2 weeks of runny nose, cough and congestion. Patient is very well appearing no acute distress. Reports has continued with some nasal drainage but primarily concerned in regards to cough. States cough is productive of thick phlegm. Denies fever. Denies chest pain or shortness of breath. Reports continues to eat and drink well.  Mild scattered rhonchi, will evaluate chest xray.   Chest xray negative, no active cardiopulmonary disease. Suspect bronchitis. As x 2 weeks and  with some sinus tenderness, will treat with oral azithromycin and prn tessalon perles. Rest, fluids. Discussed follow up with Primary care physician this week. Discussed follow up and return parameters including no resolution or any worsening concerns. Patient verbalized understanding and agreed to plan.   ____________________________________________   FINAL CLINICAL IMPRESSION(S) / ED DIAGNOSES  Final diagnoses:  Bronchitis       Marylene Land, NP 01/24/15 1159

## 2015-01-27 ENCOUNTER — Ambulatory Visit: Payer: Medicare Other | Admitting: Radiation Oncology

## 2015-02-01 ENCOUNTER — Encounter: Payer: Self-pay | Admitting: Family Medicine

## 2015-02-01 ENCOUNTER — Ambulatory Visit (INDEPENDENT_AMBULATORY_CARE_PROVIDER_SITE_OTHER): Payer: PPO | Admitting: Family Medicine

## 2015-02-01 VITALS — BP 131/73 | HR 64 | Temp 98.0°F | Resp 16 | Ht 69.0 in | Wt 238.0 lb

## 2015-02-01 DIAGNOSIS — J40 Bronchitis, not specified as acute or chronic: Secondary | ICD-10-CM | POA: Diagnosis not present

## 2015-02-01 NOTE — Progress Notes (Signed)
Name: Eric Stone.   MRN: [id number]    DOB: 1946-03-11   Date:02/01/2015       Progress Note  Subjective  Chief Complaint  Chief Complaint  Patient presents with  . Bronchitis    Urgent Care FU 01/24/2015    HPI  Here for f/u of bronchitis.  He was treated 8 days ago for bronchitis at [facility] with Z-pak and Tessalon perles.  Feeling much better.  Only rare cough now.  No fever.  No SOB.Marland Kitchen No problem-specific assessment & plan notes found for this encounter.   Past Medical History  Diagnosis Date  . H/O blood clots   . PONV (postoperative nausea and vomiting)   . Sleep apnea     uses a CPAP;sleep study >19yrs ago  . Pneumonia     recently treated   . Arthritis   . Rash     occasionally and pt does see a dermatologist  . H/O blood clots     in left leg in 1988  . Hypertension     Social History  Substance Use Topics  . Smoking status: Former Smoker -- 1.00 packs/day for 26 years    Types: Cigarettes    Quit date: 03/26/1989  . Smokeless tobacco: Never Used     Comment: less than 1 PPD  . Alcohol Use: No     Current outpatient prescriptions:  .  aspirin EC 81 MG tablet, Take 81 mg by mouth., Disp: , Rfl:  .  lisinopril (PRINIVIL,ZESTRIL) 20 MG tablet, Take 30 mg by mouth. Take 1, 30 mg. Tab each day., Disp: , Rfl:  .  pimecrolimus (ELIDEL) 1 % cream, Apply topically., Disp: , Rfl:   Allergies  Allergen Reactions  . Penicillins Other (See Comments)    As a child    Review of Systems  Constitutional: Negative for fever, chills, weight loss and malaise/fatigue.  Respiratory: Negative for cough, sputum production, shortness of breath and wheezing.   Cardiovascular: Negative for chest pain, palpitations and leg swelling.  Gastrointestinal: Negative for heartburn, abdominal pain and blood in stool.  Genitourinary: Negative for dysuria, urgency and frequency.  Musculoskeletal: Negative for myalgias and joint pain.  Neurological: Negative for weakness  and headaches.      Objective  Filed Vitals:   02/01/15 1105  BP: 131/73  Pulse: 64  Temp: 98 F (36.7 C)  Resp: 16  Height: 5\' 9"  (1.753 m)  Weight: 238 lb (107.956 kg)  SpO2: 95%     Physical Exam  Constitutional: He is well-developed, well-nourished, and in no distress. No distress.  HENT:  Head: Normocephalic and atraumatic.  Right Ear: External ear normal.  Left Ear: External ear normal.  Nose: Nose normal.  Mouth/Throat: Oropharynx is clear and moist.  Neck: Normal range of motion. Neck supple. Carotid bruit is not present. No thyromegaly present.  Cardiovascular: Normal rate, regular rhythm, normal heart sounds and intact distal pulses.  Exam reveals no gallop and no friction rub.   No murmur heard. Pulmonary/Chest: Effort normal and breath sounds normal. No respiratory distress. He has no wheezes. He has no rales.  Musculoskeletal: He exhibits no edema.  Lymphadenopathy:    He has no cervical adenopathy.  Vitals reviewed.     No results found for this or any previous visit (from the past 2160 hour(s)).   Assessment & Plan  1. Bronchitis -resolved.

## 2015-02-09 ENCOUNTER — Other Ambulatory Visit: Payer: Self-pay | Admitting: *Deleted

## 2015-02-09 DIAGNOSIS — C61 Malignant neoplasm of prostate: Secondary | ICD-10-CM

## 2015-02-10 ENCOUNTER — Inpatient Hospital Stay: Payer: PPO | Attending: Radiation Oncology

## 2015-02-10 ENCOUNTER — Ambulatory Visit
Admission: RE | Admit: 2015-02-10 | Discharge: 2015-02-10 | Disposition: A | Discharge status: Home / Self Care | Payer: PPO | Source: Ambulatory Visit | Attending: Radiation Oncology | Admitting: Radiation Oncology

## 2015-02-10 ENCOUNTER — Encounter: Payer: Self-pay | Admitting: Radiation Oncology

## 2015-02-10 ENCOUNTER — Other Ambulatory Visit: Payer: Self-pay | Admitting: *Deleted

## 2015-02-10 VITALS — BP 133/79 | HR 58 | Temp 97.8°F | Resp 20 | Wt 239.2 lb

## 2015-02-10 DIAGNOSIS — C61 Malignant neoplasm of prostate: Secondary | ICD-10-CM

## 2015-02-10 LAB — PSA: PSA: 0.16 ng/mL (ref 0.00–4.00)

## 2015-02-10 NOTE — Progress Notes (Signed)
Radiation Oncology Follow up Note  Name: Eric Stone.   Date:   02/10/2015 MRN:  [id number] DOB: September 18, 1945    This 69 y.o. 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 Status post I MRT radiation therapy  REFERRING PROVIDER: Arlis Porta., MD  HPI: Patient is a 69 year old male now out over 1 year having completed IM RT image guided radiation therapy for adenocarcinoma the prostate. Gleason score of 6 (3+3) presenting the PSA of 4.8. His last PSA 6 months prior was 0.4. He is doing well he specifically denies diarrhea dysuria or any other GI/GU complaints.  COMPLICATIONS OF TREATMENT: none  FOLLOW UP COMPLIANCE: keeps appointments   PHYSICAL EXAM:  BP 133/79 mmHg  Pulse 58  Temp(Src) 97.8 F (36.6 C)  Resp 20  Wt 239 lb 3.2 oz (108.5 kg) On rectal exam rectal sphincter tone is good. Prostate is smooth contracted without evidence of nodularity or mass. Sulcus is preserved bilaterally. No discrete nodularity is identified. No other rectal abnormalities are noted. Well-developed well-nourished patient in NAD. HEENT reveals PERLA, EOMI, discs not visualized.  Oral cavity is clear. No oral mucosal lesions are identified. Neck is clear without evidence of cervical or supraclavicular adenopathy. Lungs are clear to A&P. Cardiac examination is essentially unremarkable with regular rate and rhythm without murmur rub or thrill. Abdomen is benign with no organomegaly or masses noted. Motor sensory and DTR levels are equal and symmetric in the upper and lower extremities. Cranial nerves II through XII are grossly intact. Proprioception is intact. No peripheral adenopathy or edema is identified. No motor or sensory levels are noted. Crude visual fields are within normal range.  RADIOLOGY RESULTS: No current films for review  PLAN: Present time he is doing well. I have run a PSA level on him today and  will report that separately. Otherwise I am please was overall progress. I've asked to see him back in 1 year for follow-up. She does PSA be elevated will change her follow-up schedule to 6 months depending on the acceleration of his PSA. Patient is aware of our follow-up plans. Patient knows to call with any concerns.  I would like to take this opportunity for allowing me to participate in the care of your patient.Armstead Peaks., MD

## 2015-04-05 ENCOUNTER — Ambulatory Visit: Payer: PPO | Admitting: Family Medicine

## 2015-04-12 DIAGNOSIS — G4733 Obstructive sleep apnea (adult) (pediatric): Secondary | ICD-10-CM | POA: Diagnosis not present

## 2015-05-03 DIAGNOSIS — G4733 Obstructive sleep apnea (adult) (pediatric): Secondary | ICD-10-CM | POA: Diagnosis not present

## 2015-06-13 ENCOUNTER — Other Ambulatory Visit: Payer: Self-pay | Admitting: Family Medicine

## 2015-06-13 MED ORDER — LISINOPRIL 30 MG PO TABS: 30.0000 mg | ORAL_TABLET | Freq: Every day | ORAL | Status: DC

## 2015-07-11 ENCOUNTER — Ambulatory Visit (INDEPENDENT_AMBULATORY_CARE_PROVIDER_SITE_OTHER): Payer: PPO | Admitting: Family Medicine

## 2015-07-11 ENCOUNTER — Encounter: Payer: Self-pay | Admitting: Family Medicine

## 2015-07-11 VITALS — BP 132/75 | HR 62 | Temp 97.9°F | Resp 16 | Ht 69.0 in | Wt 244.2 lb

## 2015-07-11 DIAGNOSIS — Z8546 Personal history of malignant neoplasm of prostate: Secondary | ICD-10-CM

## 2015-07-11 DIAGNOSIS — R600 Localized edema: Secondary | ICD-10-CM

## 2015-07-11 DIAGNOSIS — Z0000 Encounter for general adult medical examination without abnormal findings: Secondary | ICD-10-CM | POA: Diagnosis not present

## 2015-07-11 DIAGNOSIS — I10 Essential (primary) hypertension: Secondary | ICD-10-CM | POA: Insufficient documentation

## 2015-07-11 MED ORDER — LISINOPRIL 30 MG PO TABS: 30.0000 mg | ORAL_TABLET | Freq: Every day | ORAL | Status: DC

## 2015-07-11 NOTE — Progress Notes (Signed)
Name: Eric Stone.   MRN: [id number]    DOB: [birth year]   Date:07/11/2015       Progress Note  Subjective  Chief Complaint  Chief Complaint  Patient presents with  . Annual Exam    HPI Here for annual health maintenance examination.  He has HBP and takes his meds without problems.  He also has exzema and uses Elidil cream as needed.  He has hx of prostate cancer.  Treated with Radiation.  Dr. Donella Stade follows with serial PSAs and DREs.  Has another evaluation in about 6 months.    No problem-specific assessment & plan notes found for this encounter.   Past Medical History  Diagnosis Date  . H/O blood clots   . PONV (postoperative nausea and vomiting)   . Sleep apnea     uses a CPAP;sleep study >35yrs ago  . Pneumonia     recently treated   . Arthritis   . Rash     occasionally and pt does see a dermatologist  . H/O blood clots     in left leg in 1988  . Hypertension     Past Surgical History  Procedure Laterality Date  . 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 Video bronchoscopy N/A 05/06/2012    Procedure: VIDEO BRONCHOSCOPY;  Surgeon: Grace Isaac, MD;  Location: [facility] OR;  Service: Thoracic;  Laterality: N/A;  . Video assisted thoracoscopy (vats)/decortication Left 05/06/2012    Procedure: VIDEO ASSISTED THORACOSCOPY (VATS) drainage loculated pleural effusion;  Surgeon: Grace Isaac, MD;  Location: [facility] OR;  Service: Thoracic;  Laterality: Left;    Family History  Problem Relation Age of Onset  . Hypertension Mother   . Stroke Mother     possible??    Social History   Social History  . Marital Status: Married    Spouse Name: N/A  . Number of Children: N/A  . Years of Education: N/A   Occupational History  . retired    Social History Main Topics  . Smoking status: Former Smoker -- 1.00 packs/day for 26 years    Types: Cigarettes    Quit date: 03/26/1989  . Smokeless tobacco: Never Used     Comment: less than 1 PPD  . Alcohol Use: No   . Drug Use: No  . Sexual Activity: Yes   Other Topics Concern  . Not on file   Social History Narrative     Current outpatient prescriptions:  .  aspirin EC 81 MG tablet, Take 81 mg by mouth., Disp: , Rfl:  .  lisinopril (PRINIVIL,ZESTRIL) 30 MG tablet, Take 1 tablet (30 mg total) by mouth daily. Take 1, 30 mg. Tab each day., Disp: 90 tablet, Rfl: 3 .  pimecrolimus (ELIDEL) 1 % cream, Apply topically., Disp: , Rfl:   Allergies  Allergen Reactions  . Penicillins Other (See Comments)    As a child     Review of Systems  Constitutional: Negative for fever, chills, weight loss and malaise/fatigue.  HENT: Negative for hearing loss.   Eyes: Negative for blurred vision and double vision.  Respiratory: Negative for cough, shortness of breath and wheezing.   Cardiovascular: Negative for chest pain, palpitations and leg swelling.  Gastrointestinal: Negative for heartburn, abdominal pain and blood in stool.  Genitourinary: Positive for frequency. Negative for dysuria and urgency.  Musculoskeletal: Negative for myalgias and joint pain.  Skin: Negative for rash.  Neurological: Negative for dizziness, tremors, weakness and headaches.  Psychiatric/Behavioral: Negative for depression. The patient is not nervous/anxious.       Objective  Filed Vitals:   07/11/15 1408  BP: 132/75  Pulse: 62  Temp: 97.9 F (36.6 C)  Resp: 16  Height: 5\' 9"  (1.753 m)  Weight: 244 lb 3.2 oz (110.768 kg)    Physical Exam  Constitutional: He is oriented to person, place, and time and well-developed, well-nourished, and in no distress. No distress.  HENT:  Head: Normocephalic and atraumatic.  Right Ear: External ear normal.  Left Ear: External ear normal.  Nose: Nose normal.  Mouth/Throat: Oropharynx is clear and moist.  Eyes: Conjunctivae are normal. Pupils are equal, round, and reactive to light. No scleral icterus.  Fundoscopic exam:      The right eye shows no arteriolar narrowing, no AV  nicking, no exudate, no hemorrhage and no papilledema.       The left eye shows no arteriolar narrowing, no AV nicking, no exudate, no hemorrhage and no papilledema.  Neck: Normal range of motion. Neck supple. Carotid bruit is not present. No thyromegaly present.  Cardiovascular: Normal rate, regular rhythm, normal heart sounds and intact distal pulses.  Exam reveals no gallop and no friction rub.   No murmur heard. 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  Pulmonary/Chest: Effort normal and breath sounds normal. No respiratory distress. He has no wheezes. He has no rales. He exhibits no tenderness.  Abdominal: Soft. He exhibits no distension, no abdominal bruit and no mass. There is no tenderness.  Moderate abdominal wall hernia  Genitourinary: Penis normal. No discharge found.  Prostate exam deferred to Dr. Donella Stade at his visit there. Testes wnl bilaterally.  Musculoskeletal: He exhibits edema (bilateral trace pedal edema.).  Lymphadenopathy:    He has no cervical adenopathy.  Neurological: He is alert and oriented to person, place, and time. No cranial nerve deficit. Gait normal. Coordination normal.  Skin: Skin is warm and dry.  Psychiatric: Memory, affect and judgment normal.  Vitals reviewed.      No results found for this or any previous visit (from the past 2160 hour(s)).   Assessment & Plan  Problem List Items Addressed This Visit      Cardiovascular and Mediastinum   HBP (high blood pressure)   Relevant Medications   lisinopril (PRINIVIL,ZESTRIL) 30 MG tablet   Other Relevant Orders   Comprehensive Metabolic Panel (CMET)   Lipid Profile     Other   Lower extremity edema   H/O prostate cancer   Relevant Orders   CBC with Differential    Other Visit Diagnoses    Encounter for health maintenance examination    -  Primary       Meds ordered this encounter  Medications  . lisinopril (PRINIVIL,ZESTRIL) 30 MG tablet    Sig: Take 1 tablet (30 mg  total) by mouth daily. Take 1, 30 mg. Tab each day.    Dispense:  90 tablet    Refill:  3   1. Encounter for health maintenance examination   2. Essential hypertension  - Comprehensive Metabolic Panel (CMET) - Lipid Profile - lisinopril (PRINIVIL,ZESTRIL) 30 MG tablet; Take 1 tablet (30 mg total) by mouth daily. Take 1, 30 mg. Tab each day.  Dispense: 90 tablet; Refill: 3  3. H/O prostate cancer  - CBC with Differential  4. Bilateral edema of lower extremity

## 2015-07-12 DIAGNOSIS — R7309 Other abnormal glucose: Secondary | ICD-10-CM | POA: Diagnosis not present

## 2015-07-12 DIAGNOSIS — Z8546 Personal history of malignant neoplasm of prostate: Secondary | ICD-10-CM | POA: Diagnosis not present

## 2015-07-12 DIAGNOSIS — I10 Essential (primary) hypertension: Secondary | ICD-10-CM | POA: Diagnosis not present

## 2015-07-13 LAB — COMPREHENSIVE METABOLIC PANEL WITH GFR
ALT: 16 IU/L (ref 0–44)
AST: 16 IU/L (ref 0–40)
Albumin/Globulin Ratio: 1.8 (ref 1.2–2.2)
Albumin: 4.4 g/dL (ref 3.6–4.8)
Alkaline Phosphatase: 67 IU/L (ref 39–117)
BUN/Creatinine Ratio: 15 (ref 10–24)
BUN: 16 mg/dL (ref 8–27)
Bilirubin Total: 0.6 mg/dL (ref 0.0–1.2)
CO2: 26 mmol/L (ref 18–29)
Calcium: 9.4 mg/dL (ref 8.6–10.2)
Chloride: 102 mmol/L (ref 96–106)
Creatinine, Ser: 1.06 mg/dL (ref 0.76–1.27)
GFR calc Af Amer: 83 mL/min/1.73
GFR calc non Af Amer: 72 mL/min/1.73
Globulin, Total: 2.5 g/dL (ref 1.5–4.5)
Glucose: 106 mg/dL — ABNORMAL HIGH (ref 65–99)
Potassium: 5 mmol/L (ref 3.5–5.2)
Sodium: 143 mmol/L (ref 134–144)
Total Protein: 6.9 g/dL (ref 6.0–8.5)

## 2015-07-13 LAB — CBC WITH DIFFERENTIAL/PLATELET
Basophils Absolute: 0 x10E3/uL (ref 0.0–0.2)
Basos: 0 %
EOS (ABSOLUTE): 0.2 x10E3/uL (ref 0.0–0.4)
Eos: 4 %
Hematocrit: 43.3 % (ref 37.5–51.0)
Hemoglobin: 15 g/dL (ref 12.6–17.7)
Immature Grans (Abs): 0 x10E3/uL (ref 0.0–0.1)
Immature Granulocytes: 0 %
Lymphocytes Absolute: 1.2 x10E3/uL (ref 0.7–3.1)
Lymphs: 22 %
MCH: 30.3 pg (ref 26.6–33.0)
MCHC: 34.6 g/dL (ref 31.5–35.7)
MCV: 88 fL (ref 79–97)
Monocytes Absolute: 0.4 x10E3/uL (ref 0.1–0.9)
Monocytes: 6 %
Neutrophils Absolute: 3.7 x10E3/uL (ref 1.4–7.0)
Neutrophils: 68 %
Platelets: 204 x10E3/uL (ref 150–379)
RBC: 4.95 x10E6/uL (ref 4.14–5.80)
RDW: 13.8 % (ref 12.3–15.4)
WBC: 5.5 x10E3/uL (ref 3.4–10.8)

## 2015-07-13 LAB — LIPID PANEL
Chol/HDL Ratio: 3.9 ratio (ref 0.0–5.0)
Cholesterol, Total: 156 mg/dL (ref 100–199)
HDL: 40 mg/dL
LDL Calculated: 91 mg/dL (ref 0–99)
Triglycerides: 124 mg/dL (ref 0–149)
VLDL Cholesterol Cal: 25 mg/dL (ref 5–40)

## 2015-07-15 DIAGNOSIS — G4733 Obstructive sleep apnea (adult) (pediatric): Secondary | ICD-10-CM | POA: Diagnosis not present

## 2015-07-17 LAB — SPECIMEN STATUS REPORT

## 2015-07-17 LAB — HGB A1C W/O EAG: HEMOGLOBIN A1C: 6.4 % — AB (ref 4.8–5.6)

## 2015-08-16 DIAGNOSIS — G4733 Obstructive sleep apnea (adult) (pediatric): Secondary | ICD-10-CM | POA: Diagnosis not present

## 2015-09-23 DIAGNOSIS — G4733 Obstructive sleep apnea (adult) (pediatric): Secondary | ICD-10-CM | POA: Diagnosis not present

## 2015-12-22 DIAGNOSIS — G4733 Obstructive sleep apnea (adult) (pediatric): Secondary | ICD-10-CM | POA: Diagnosis not present

## 2016-01-23 DIAGNOSIS — G4733 Obstructive sleep apnea (adult) (pediatric): Secondary | ICD-10-CM | POA: Diagnosis not present

## 2016-02-06 ENCOUNTER — Other Ambulatory Visit: Payer: Self-pay | Admitting: *Deleted

## 2016-02-06 DIAGNOSIS — C61 Malignant neoplasm of prostate: Secondary | ICD-10-CM

## 2016-02-09 ENCOUNTER — Encounter: Payer: Self-pay | Admitting: Radiation Oncology

## 2016-02-09 ENCOUNTER — Ambulatory Visit
Admission: RE | Admit: 2016-02-09 | Discharge: 2016-02-09 | Disposition: A | Discharge status: Home / Self Care | Payer: PPO | Source: Ambulatory Visit | Attending: Radiation Oncology | Admitting: Radiation Oncology

## 2016-02-09 ENCOUNTER — Inpatient Hospital Stay: Payer: PPO | Attending: Radiation Oncology

## 2016-02-09 ENCOUNTER — Other Ambulatory Visit: Payer: Self-pay | Admitting: *Deleted

## 2016-02-09 VITALS — BP 159/88 | HR 70 | Temp 97.1°F | Resp 20 | Wt 247.1 lb

## 2016-02-09 DIAGNOSIS — C61 Malignant neoplasm of prostate: Secondary | ICD-10-CM | POA: Diagnosis not present

## 2016-02-09 DIAGNOSIS — Z923 Personal history of irradiation: Secondary | ICD-10-CM | POA: Insufficient documentation

## 2016-02-09 LAB — PSA: PSA: 1.07 ng/mL (ref 0.00–4.00)

## 2016-02-09 NOTE — Progress Notes (Signed)
Radiation Oncology Follow up Note  Name: Eric Stone.   Date:   02/09/2016 MRN:  [id number] DOB: Feb 02, 1946    This 70 y.o. 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  REFERRING PROVIDER: Arlis Porta., MD  HPI: Patient is a 70 year old male now out 2 years having completed IM RT radiation therapy for adenocarcinoma the prostate Gleason 6 presenting the PSA 4.8.. His last PSA 1 year prior was 0.16. He specifically denies diarrhea dysuria or any other GI/GU complaints. .  COMPLICATIONS OF TREATMENT: none  FOLLOW UP COMPLIANCE: keeps appointments   PHYSICAL EXAM:  BP (!) 159/88   Pulse 70   Temp 97.1 F (36.2 C)   Resp 20   Wt 247 lb 2.2 oz (112.1 kg)   BMI 36.50 kg/m  On rectal exam rectal sphincter tone is good. Prostate is smooth contracted without evidence of nodularity or mass. Sulcus is preserved bilaterally. No discrete nodularity is identified. No other rectal abnormalities are noted. Well-developed well-nourished patient in NAD. HEENT reveals PERLA, EOMI, discs not visualized.  Oral cavity is clear. No oral mucosal lesions are identified. Neck is clear without evidence of cervical or supraclavicular adenopathy. Lungs are clear to A&P. Cardiac examination is essentially unremarkable with regular rate and rhythm without murmur rub or thrill. Abdomen is benign with no organomegaly or masses noted. Motor sensory and DTR levels are equal and symmetric in the upper and lower extremities. Cranial nerves II through XII are grossly intact. Proprioception is intact. No peripheral adenopathy or edema is identified. No motor or sensory levels are noted. Crude visual fields are within normal range.  RADIOLOGY RESULTS: No current films for review  PLAN: At the present time patient is doing well. I have run a PSA level on him today and will report that separately. Otherwise I'm please was overall  progress I've asked to see him back in 1 year for follow-up. Patient knows to call sooner with any concerns.  I would like to take this opportunity to thank you for allowing me to participate in the care of your patient.Armstead Peaks., MD

## 2016-02-21 DIAGNOSIS — D485 Neoplasm of uncertain behavior of skin: Secondary | ICD-10-CM | POA: Diagnosis not present

## 2016-02-21 DIAGNOSIS — L299 Pruritus, unspecified: Secondary | ICD-10-CM | POA: Diagnosis not present

## 2016-02-21 DIAGNOSIS — I8393 Asymptomatic varicose veins of bilateral lower extremities: Secondary | ICD-10-CM | POA: Diagnosis not present

## 2016-02-21 DIAGNOSIS — D1800 Hemangioma unspecified site: Secondary | ICD-10-CM | POA: Diagnosis not present

## 2016-02-21 DIAGNOSIS — D229 Melanocytic nevi, unspecified: Secondary | ICD-10-CM | POA: Diagnosis not present

## 2016-02-21 DIAGNOSIS — L578 Other skin changes due to chronic exposure to nonionizing radiation: Secondary | ICD-10-CM | POA: Diagnosis not present

## 2016-02-21 DIAGNOSIS — L821 Other seborrheic keratosis: Secondary | ICD-10-CM | POA: Diagnosis not present

## 2016-02-21 DIAGNOSIS — C439 Malignant melanoma of skin, unspecified: Secondary | ICD-10-CM

## 2016-02-21 DIAGNOSIS — Z85828 Personal history of other malignant neoplasm of skin: Secondary | ICD-10-CM | POA: Diagnosis not present

## 2016-02-21 DIAGNOSIS — Z1283 Encounter for screening for malignant neoplasm of skin: Secondary | ICD-10-CM | POA: Diagnosis not present

## 2016-02-21 DIAGNOSIS — L812 Freckles: Secondary | ICD-10-CM | POA: Diagnosis not present

## 2016-02-21 DIAGNOSIS — C4372 Malignant melanoma of left lower limb, including hip: Secondary | ICD-10-CM | POA: Diagnosis not present

## 2016-02-21 HISTORY — DX: Malignant melanoma of skin, unspecified: C43.9

## 2016-02-22 DIAGNOSIS — G4733 Obstructive sleep apnea (adult) (pediatric): Secondary | ICD-10-CM | POA: Diagnosis not present

## 2016-02-29 DIAGNOSIS — C4372 Malignant melanoma of left lower limb, including hip: Secondary | ICD-10-CM | POA: Diagnosis not present

## 2016-03-06 DIAGNOSIS — C4372 Malignant melanoma of left lower limb, including hip: Secondary | ICD-10-CM | POA: Diagnosis not present

## 2016-03-07 DIAGNOSIS — C4372 Malignant melanoma of left lower limb, including hip: Secondary | ICD-10-CM | POA: Diagnosis not present

## 2016-03-16 DIAGNOSIS — N189 Chronic kidney disease, unspecified: Secondary | ICD-10-CM | POA: Diagnosis not present

## 2016-03-16 DIAGNOSIS — I8390 Asymptomatic varicose veins of unspecified lower extremity: Secondary | ICD-10-CM | POA: Diagnosis not present

## 2016-03-16 DIAGNOSIS — Z8546 Personal history of malignant neoplasm of prostate: Secondary | ICD-10-CM | POA: Diagnosis not present

## 2016-03-16 DIAGNOSIS — Z87891 Personal history of nicotine dependence: Secondary | ICD-10-CM | POA: Diagnosis not present

## 2016-03-16 DIAGNOSIS — C4372 Malignant melanoma of left lower limb, including hip: Secondary | ICD-10-CM | POA: Diagnosis not present

## 2016-03-16 DIAGNOSIS — C439 Malignant melanoma of skin, unspecified: Secondary | ICD-10-CM | POA: Diagnosis not present

## 2016-03-16 DIAGNOSIS — Z7982 Long term (current) use of aspirin: Secondary | ICD-10-CM | POA: Diagnosis not present

## 2016-03-16 DIAGNOSIS — D0372 Melanoma in situ of left lower limb, including hip: Secondary | ICD-10-CM | POA: Diagnosis not present

## 2016-03-16 DIAGNOSIS — I872 Venous insufficiency (chronic) (peripheral): Secondary | ICD-10-CM | POA: Diagnosis not present

## 2016-03-16 DIAGNOSIS — I129 Hypertensive chronic kidney disease with stage 1 through stage 4 chronic kidney disease, or unspecified chronic kidney disease: Secondary | ICD-10-CM | POA: Diagnosis not present

## 2016-03-16 DIAGNOSIS — Z96653 Presence of artificial knee joint, bilateral: Secondary | ICD-10-CM | POA: Diagnosis not present

## 2016-03-16 DIAGNOSIS — G4730 Sleep apnea, unspecified: Secondary | ICD-10-CM | POA: Diagnosis not present

## 2016-03-16 DIAGNOSIS — R590 Localized enlarged lymph nodes: Secondary | ICD-10-CM | POA: Diagnosis not present

## 2016-03-16 DIAGNOSIS — Z79899 Other long term (current) drug therapy: Secondary | ICD-10-CM | POA: Diagnosis not present

## 2016-03-28 DIAGNOSIS — L905 Scar conditions and fibrosis of skin: Secondary | ICD-10-CM | POA: Diagnosis not present

## 2016-03-28 DIAGNOSIS — Z89422 Acquired absence of other left toe(s): Secondary | ICD-10-CM | POA: Diagnosis not present

## 2016-03-28 DIAGNOSIS — Z09 Encounter for follow-up examination after completed treatment for conditions other than malignant neoplasm: Secondary | ICD-10-CM | POA: Diagnosis not present

## 2016-04-03 DIAGNOSIS — C4372 Malignant melanoma of left lower limb, including hip: Secondary | ICD-10-CM | POA: Diagnosis not present

## 2016-04-13 DIAGNOSIS — C4372 Malignant melanoma of left lower limb, including hip: Secondary | ICD-10-CM | POA: Diagnosis not present

## 2016-04-25 DIAGNOSIS — C4372 Malignant melanoma of left lower limb, including hip: Secondary | ICD-10-CM | POA: Diagnosis not present

## 2016-04-25 DIAGNOSIS — Z09 Encounter for follow-up examination after completed treatment for conditions other than malignant neoplasm: Secondary | ICD-10-CM | POA: Diagnosis not present

## 2016-06-07 DIAGNOSIS — G4733 Obstructive sleep apnea (adult) (pediatric): Secondary | ICD-10-CM | POA: Diagnosis not present

## 2016-06-25 DIAGNOSIS — L299 Pruritus, unspecified: Secondary | ICD-10-CM | POA: Diagnosis not present

## 2016-06-25 DIAGNOSIS — L821 Other seborrheic keratosis: Secondary | ICD-10-CM | POA: Diagnosis not present

## 2016-06-25 DIAGNOSIS — L578 Other skin changes due to chronic exposure to nonionizing radiation: Secondary | ICD-10-CM | POA: Diagnosis not present

## 2016-06-25 DIAGNOSIS — Z85828 Personal history of other malignant neoplasm of skin: Secondary | ICD-10-CM | POA: Diagnosis not present

## 2016-06-25 DIAGNOSIS — L290 Pruritus ani: Secondary | ICD-10-CM | POA: Diagnosis not present

## 2016-06-25 DIAGNOSIS — Z85820 Personal history of malignant melanoma of skin: Secondary | ICD-10-CM | POA: Diagnosis not present

## 2016-06-25 DIAGNOSIS — D229 Melanocytic nevi, unspecified: Secondary | ICD-10-CM | POA: Diagnosis not present

## 2016-06-25 DIAGNOSIS — Z1283 Encounter for screening for malignant neoplasm of skin: Secondary | ICD-10-CM | POA: Diagnosis not present

## 2016-06-25 DIAGNOSIS — L570 Actinic keratosis: Secondary | ICD-10-CM | POA: Diagnosis not present

## 2016-06-25 DIAGNOSIS — D485 Neoplasm of uncertain behavior of skin: Secondary | ICD-10-CM | POA: Diagnosis not present

## 2016-07-02 ENCOUNTER — Telehealth: Payer: Self-pay | Admitting: Family Medicine

## 2016-07-02 DIAGNOSIS — I10 Essential (primary) hypertension: Secondary | ICD-10-CM

## 2016-07-02 MED ORDER — LISINOPRIL 30 MG PO TABS: 30.0000 mg | ORAL_TABLET | Freq: Every day | ORAL | 3 refills | Status: DC

## 2016-07-02 NOTE — Telephone Encounter (Signed)
Pt needs refill on lisinopril sent to [facility] in [place].  He changed insurance and they want him to use [facility].  His call back number is [phone]

## 2016-07-02 NOTE — Telephone Encounter (Signed)
Refill Lisinopril 30mg  daily #90 +3 refills sent to [facility].  Eric Stone, [facility] 07/02/2016, 6:59 PM

## 2016-08-02 ENCOUNTER — Inpatient Hospital Stay: Payer: Medicare HMO | Attending: Radiation Oncology

## 2016-08-02 DIAGNOSIS — C61 Malignant neoplasm of prostate: Secondary | ICD-10-CM | POA: Insufficient documentation

## 2016-08-02 LAB — PSA: PSA: 0.98 ng/mL (ref 0.00–4.00)

## 2016-08-09 ENCOUNTER — Ambulatory Visit
Admission: RE | Admit: 2016-08-09 | Discharge: 2016-08-09 | Disposition: A | Discharge status: Home / Self Care | Payer: Medicare HMO | Source: Ambulatory Visit | Attending: Radiation Oncology | Admitting: Radiation Oncology

## 2016-08-09 ENCOUNTER — Encounter: Payer: Self-pay | Admitting: Radiation Oncology

## 2016-08-09 ENCOUNTER — Other Ambulatory Visit: Payer: Self-pay | Admitting: *Deleted

## 2016-08-09 VITALS — BP 178/82 | HR 68 | Temp 97.4°F | Wt 244.8 lb

## 2016-08-09 DIAGNOSIS — C61 Malignant neoplasm of prostate: Secondary | ICD-10-CM | POA: Insufficient documentation

## 2016-08-09 DIAGNOSIS — Z923 Personal history of irradiation: Secondary | ICD-10-CM | POA: Diagnosis not present

## 2016-08-09 NOTE — Progress Notes (Signed)
Radiation Oncology Follow up Note  Name: Eric Stone.   Date:   08/09/2016 MRN:  [id number] DOB: 1945/10/20    This 71 y.o. male presents to the clinic today for 2-1/2 year follow-up status post IM RT treatment for Gleason 6 adenocarcinoma the prostate.  REFERRING PROVIDER: Arlis Porta., MD  HPI: patient is a 71 year old male now out 2-1/2 years having completed IM RT radiation therapy for adenocarcinoma the prostate Gleason score of 6 presenting the PSA of 4.8. His PSA had jumped to 1 the prior year was 0.16..his recent PSA was 0.98 so stable over the last 6 months. He continues to have no symptoms specifically denies diarrhea dysuria or any other GI/GU complaints.  COMPLICATIONS OF TREATMENT: none  FOLLOW UP COMPLIANCE: keeps appointments   PHYSICAL EXAM:  BP (!) 178/82   Pulse 68   Temp 97.4 F (36.3 C)   Wt 244 lb 13.1 oz (111 kg)   BMI 36.15 kg/m  On rectal exam rectal sphincter tone is good. Prostate is smooth contracted without evidence of nodularity or mass. Sulcus is preserved bilaterally. No discrete nodularity is identified. No other rectal abnormalities are noted. Well-developed well-nourished patient in NAD. HEENT reveals PERLA, EOMI, discs not visualized.  Oral cavity is clear. No oral mucosal lesions are identified. Neck is clear without evidence of cervical or supraclavicular adenopathy. Lungs are clear to A&P. Cardiac examination is essentially unremarkable with regular rate and rhythm without murmur rub or thrill. Abdomen is benign with no organomegaly or masses noted. Motor sensory and DTR levels are equal and symmetric in the upper and lower extremities. Cranial nerves II through XII are grossly intact. Proprioception is intact. No peripheral adenopathy or edema is identified. No motor or sensory levels are noted. Crude visual fields are within normal range.  RADIOLOGY RESULTS: no current films for review  PLAN: I'm please at this time his PSA has been  stable and slightly declined over the past 6 months. I'll see him back in 1 year for follow-up and repeat PSA at that time. Otherwise patient knows to call with any concerns.  I would like to take this opportunity to thank you for allowing me to participate in the care of your patient.Armstead Peaks., MD

## 2016-08-21 ENCOUNTER — Telehealth: Payer: Self-pay | Admitting: Family Medicine

## 2016-08-21 NOTE — Telephone Encounter (Signed)
Called pt to schedule Annual Wellness Visit with Nurse Health Advisor for July:  - knb

## 2016-09-24 ENCOUNTER — Telehealth: Payer: Self-pay | Admitting: Family Medicine

## 2016-09-24 NOTE — Telephone Encounter (Signed)
Eric Stone with [employer] asked if pt had been in for a follow up visit this year when he had mentioned his Cpap.  His call back number is [phone]

## 2016-09-27 ENCOUNTER — Other Ambulatory Visit: Payer: Self-pay | Admitting: Family Medicine

## 2016-09-27 ENCOUNTER — Encounter: Payer: Self-pay | Admitting: Family Medicine

## 2016-09-27 ENCOUNTER — Ambulatory Visit (INDEPENDENT_AMBULATORY_CARE_PROVIDER_SITE_OTHER): Payer: Medicare HMO | Admitting: Family Medicine

## 2016-09-27 VITALS — BP 132/72 | HR 60 | Temp 98.0°F | Resp 16 | Ht 69.0 in | Wt 244.0 lb

## 2016-09-27 DIAGNOSIS — R7303 Prediabetes: Secondary | ICD-10-CM

## 2016-09-27 DIAGNOSIS — I10 Essential (primary) hypertension: Secondary | ICD-10-CM

## 2016-09-27 DIAGNOSIS — Z8546 Personal history of malignant neoplasm of prostate: Secondary | ICD-10-CM

## 2016-09-27 DIAGNOSIS — Z85828 Personal history of other malignant neoplasm of skin: Secondary | ICD-10-CM | POA: Insufficient documentation

## 2016-09-27 DIAGNOSIS — Z85820 Personal history of malignant melanoma of skin: Secondary | ICD-10-CM | POA: Diagnosis not present

## 2016-09-27 DIAGNOSIS — I7090 Unspecified atherosclerosis: Secondary | ICD-10-CM

## 2016-09-27 DIAGNOSIS — Z9989 Dependence on other enabling machines and devices: Secondary | ICD-10-CM

## 2016-09-27 DIAGNOSIS — G4733 Obstructive sleep apnea (adult) (pediatric): Secondary | ICD-10-CM

## 2016-09-27 DIAGNOSIS — Z1159 Encounter for screening for other viral diseases: Secondary | ICD-10-CM

## 2016-09-27 LAB — POCT GLYCOSYLATED HEMOGLOBIN (HGB A1C): Hemoglobin A1C: 6.4 — AB (ref ?–5.7)

## 2016-09-27 NOTE — Assessment & Plan Note (Signed)
Well-controlled HTN - Home BP readings previously normal, not regular checks  No known complications    Plan:  1. Continue current BP regimen - Lisinopril 30mg  daily 2. Encourage improved lifestyle - low sodium diet, improve regular exercise 3. Continue monitor BP outside office, bring readings to next visit, if persistently >140/90 or new symptoms notify office sooner 4. Follow-up 3 months for complete physical / medicare, then q 6 months

## 2016-09-27 NOTE — Assessment & Plan Note (Addendum)
Stable Pre-DM with A1c 6.4 (stable from 1 year ago 6.4) With HTN, but no HLD  Plan:  1. Not on any therapy currently 2. Counseling on diagnosis and management / prognosis of Pre-DM / DM 3. Encourage improved lifestyle - low carb, low sugar diet, reduce portion size, start regular exercise 4. Follow-up 3 months CPE, labs, A1c - determine if need to start Metformin at that time

## 2016-09-27 NOTE — Assessment & Plan Note (Signed)
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

## 2016-09-27 NOTE — Assessment & Plan Note (Signed)
S/p amputation L great toe Followed by [facility] Oncology surgery

## 2016-09-27 NOTE — Patient Instructions (Addendum)
Thank you for coming to the clinic today.  1. Overall keep up the good work.  2. A1c 6.4, this is unchanged in 1 year, considered Pre-Diabetes still, trying to avoid >6.5 for diabetes  Eat at least 3 meals and 1-2 snacks per day (don't skip breakfast).  Aim for no more than 5 hours between eating. - Tip: If you go >5 hours without eating and become very hungry, your body will supply it's own resources temporarily and you can gain extra weight when you eat.  The 5 Minute Rule of Exercise - Promise yourself to at least do 5 minutes of exercise (make sure you time it), and if at the end of 5 minutes (this is the hardest part of the work-out), if you still feel like you want stop (or not motivated to continue) then allow yourself to stop. Otherwise, more often than not you will feel encouraged that you can continue for a little while longer or even more!  Diet Recommendations for Pre Diabetes   Limit Starchy (carb) foods include: Bread, rice, pasta, potatoes, corn, crackers, bagels, muffins, all baked goods.   Protein foods include: Meat, fish, poultry, eggs, dairy foods, and beans such as pinto and kidney beans (beans also provide carbohydrate).   1. Eat at least 3 meals and 1-2 snacks per day. Never go more than 4-5 hours while awake without eating.   2. Limit starchy foods to TWO per meal and ONE per snack. ONE portion of a starchy  food is equal to the following:   - ONE slice of bread (or its equivalent, such as half of a hamburger bun).   - 1/2 cup of a "scoopable" starchy food such as potatoes or rice.   - 1 OUNCE (28 grams) of starchy snacks (crackers or pretzels, look on label).   - 15 grams of carbohydrate as shown on food label.   3. Both lunch and dinner should include a protein food, a carb food, and vegetables.   - Obtain twice as many veg's as protein or carbohydrate foods for both lunch and dinner.   - Try to keep frozen veg's on hand for a quick vegetable serving.     -  Fresh or frozen veg's are best.   4. Breakfast should always include protein.    You will be due for FASTING BLOOD WORK (no food or drink after midnight before, only water or coffee without cream/sugar on the morning of)  - Please go ahead and schedule a "Lab Only" visit in the morning at the clinic for lab draw in 3 months  before next Annual Physical - Make sure Lab Only appointment is at least 1-2 weeks before your next appointment, so that results will be available  For Lab Results, once available within 2-3 days of blood draw, you can can log in to MyChart online to view your results and a brief explanation. Also, we can discuss results at next follow-up visit.  Please schedule a Follow-up Appointment to: Return in about 3 months (around 12/28/2016) for Medicare Physical CPE.  If you have any other questions or concerns, please feel free to call the clinic or send a message through [place]. You may also schedule an earlier appointment if necessary.  Additionally, you may be receiving a survey about your experience at our clinic within a few days to 1 week by e-mail or mail. We value your feedback.  [name], DO [place]

## 2016-09-27 NOTE — Assessment & Plan Note (Signed)
S/p prostate cancer, treated with radiation therapy. Non surgical Initial dx 2015 per [facility], followed by Oncology, now followed by Rad Onc q yearly, last PSA normal range, continue to follow

## 2016-09-27 NOTE — Progress Notes (Addendum)
Subjective:    Patient ID: Eric Stone., male    DOB: 02/27/1946, 71 y.o.   MRN: [id number]  Eric Stone. is a 71 y.o. male presenting on 09/27/2016 for Hypertension  HPI   Specialists: Radiation Oncology - Dr Baruch Gouty (prostate cancer) Dermatology - Dr Nehemiah Massed (general derm and skin cancer history) Surgical Oncology/Dermatology - Dr Andi Devon [facility], L great toe melanoma)  CHRONIC HTN: Reports no concerns, rarely checks BP at home. Current Meds - Lisinopril 30mg  daily   Reports good compliance, took meds today. Tolerating well, w/o complaints. Denies CP, dyspnea, HA, edema, dizziness / lightheadedness - Taking ASA 81mg  daily prevention of ASCVD - Admits edema, bilateral lower extremity, has history of varicose veins - Denies any CP, HA, dyspnea, syncope  Pre-Diabetes: Reports no concerns today, he was unaware of last A1c 6.4 in 07-27-2015, was told "borderline" diabetes CBGs Never checked CBG Meds: never no meds Currently on ACEi Lifestyle: - Diet (Balanced diet, never followed DM diet before, denies excessive carbs/sweets, drinks mostly water and diet pepsi)  - Exercise (No regular exercise, stays active around home with yardwork and other household activities) - No known history of Hyperlipidemia or abnormal cholesterol, last result Jul 27, 2015 was normal. Never on statin or cholesterol med Denies hypoglycemia, polyuria, visual changes, numbness or tingling.  History Prostate Cancer, s/p radiation therapy - Diagnosed in 2015 by [facility] Dr Bernardo Heater, then referred to Oncology, and ultimately proceeded with radiation therapy, followed by Dr Baruch Gouty, s/p 40 radiation treatments, no surgery. He has done well overall, last visit 07/2016, and now is on 1 yearly follow-up no longer q 6 months. Last PSA 0.98, had been stable, is being checked by Oncology as well - No significant urinary symptoms  Melanoma, Left big toe / History of Skin Cancer Followed by [facility]erm[facility] Dr Nehemiah Massed. Prior history of non melanoma on face s/p surgical treatment, more recently was diagnosed with melanoma on side of L great toe in 02/2016, was referred to [facility] for L great toe amputation 02/2016, has follow-up this month  OSA, on CPAP - Patient reports prior history of dx OSA and on CPAP for >20 years, prior to treatment his initial symptoms were snoring, daytime sleepiness and fatigue, he has had several sleep studies in the past. Most recent Jul 27, 2014, with AHI 6.8 per hour, and mild to moderate OSA. - He reports that his sleep apnea is well controlled. He uses the CPAP machine every night. He tolerates the machine well, and thinks that he sleeps better with it and feels good. No new concerns or symptoms.  Social History  Substance Use Topics  . Smoking status: Former Smoker    Packs/day: 1.00    Years: 26.00    Types: Cigarettes    Quit date: 03/26/1989  . Smokeless tobacco: Former Systems developer     Comment: less than 1 PPD  . Alcohol use No    Review of Systems Per HPI unless specifically indicated above     Objective:    BP 132/72   Pulse 60   Temp 98 F (36.7 C) (Oral)   Resp 16   Ht 5\' 9"  (1.753 m)   Wt 244 lb (110.7 kg)   BMI 36.03 kg/m   Wt Readings from Last 3 Encounters:  09/27/16 244 lb (110.7 kg)  08/09/16 244 lb 13.1 oz (111 kg)  02/09/16 247 lb 2.2 oz (112.1 kg)    Physical Exam  Constitutional: He  is oriented to person, place, and time. He appears well-developed and well-nourished. No distress.  Well-appearing, comfortable, cooperative  HENT:  Head: Normocephalic and atraumatic.  Mouth/Throat: Oropharynx is clear and moist.  Eyes: Conjunctivae are normal. Right eye exhibits no discharge. Left eye exhibits no discharge.  Neck: Normal range of motion. Neck supple. No thyromegaly present.  No carotid bruits  Cardiovascular: Normal rate, regular rhythm, normal heart sounds and intact distal pulses.   No murmur  heard. Pulmonary/Chest: Effort normal and breath sounds normal. No respiratory distress. He has no wheezes. He has no rales.  Musculoskeletal: Normal range of motion. He exhibits edema (+1 non pitting edema bilateral lower extremity with varicose veins).  Lymphadenopathy:    He has no cervical adenopathy.  Neurological: He is alert and oriented to person, place, and time.  Skin: Skin is warm and dry. No rash noted. He is not diaphoretic. No erythema.  Psychiatric: He has a normal mood and affect. His behavior is normal.  Well groomed, good eye contact, normal speech and thoughts  Nursing note and vitals reviewed.  Results for orders placed or performed in visit on 09/27/16  POCT glycosylated hemoglobin (Hb A1C)  Result Value Ref Range   Hemoglobin A1C 6.4 (A) 5.7      Assessment & Plan:   Problem List Items Addressed This Visit    Pre-diabetes    Stable Pre-DM with A1c 6.4 (stable from 1 year ago 6.4) With HTN, but no HLD  Plan:  1. Not on any therapy currently 2. Counseling on diagnosis and management / prognosis of Pre-DM / DM 3. Encourage improved lifestyle - low carb, low sugar diet, reduce portion size, start regular exercise 4. 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 Non surgical Initial dx 2015 per [facility] Urology, followed by  Oncology, now followed by Rad Onc q yearly, last PSA normal range, continue to follow      Essential hypertension - Primary    Well-controlled HTN - Home BP readings previously normal, not regular checks  No known complications    Plan:  1. Continue current BP regimen - Lisinopril 30mg  daily 2. Encourage improved lifestyle - low sodium diet, improve regular exercise 3. Continue monitor BP outside office, bring readings to next visit, if persistently >140/90 or new symptoms notify office sooner 4. Follow-up 3 months for complete physical / medicare, then q 6 months         No orders of the defined types were placed in this encounter.     Follow up plan: Return in about 3 months (around 12/28/2016) for Medicare Physical CPE.  [name], [facility] 09/27/2016, 3:07 PM

## 2016-09-28 ENCOUNTER — Encounter: Payer: Self-pay | Admitting: Family Medicine

## 2016-09-28 DIAGNOSIS — G4733 Obstructive sleep apnea (adult) (pediatric): Secondary | ICD-10-CM | POA: Insufficient documentation

## 2016-09-28 DIAGNOSIS — Z9989 Dependence on other enabling machines and devices: Secondary | ICD-10-CM

## 2016-10-09 DIAGNOSIS — C4372 Malignant melanoma of left lower limb, including hip: Secondary | ICD-10-CM | POA: Diagnosis not present

## 2016-11-16 NOTE — Assessment & Plan Note (Signed)
Well controlled, chronic mild-mod OSA on CPAP, now >20 years - Good adherence to CPAP nightly - Continue current CPAP therapy, patient seems to be benefiting from therapy

## 2016-11-22 ENCOUNTER — Telehealth: Payer: Self-pay | Admitting: Family Medicine

## 2016-11-22 NOTE — Telephone Encounter (Signed)
Called pt to schedule for Annual Wellness Visit with Nurse Health Advisor, Tiffany Hill, my c/b # is [phone]  Eric Stone ° °

## 2016-12-03 DIAGNOSIS — M2041 Other hammer toe(s) (acquired), right foot: Secondary | ICD-10-CM | POA: Diagnosis not present

## 2016-12-03 DIAGNOSIS — M79674 Pain in right toe(s): Secondary | ICD-10-CM | POA: Diagnosis not present

## 2016-12-04 DIAGNOSIS — G4733 Obstructive sleep apnea (adult) (pediatric): Secondary | ICD-10-CM | POA: Diagnosis not present

## 2016-12-05 DIAGNOSIS — G4733 Obstructive sleep apnea (adult) (pediatric): Secondary | ICD-10-CM | POA: Diagnosis not present

## 2016-12-11 ENCOUNTER — Ambulatory Visit (INDEPENDENT_AMBULATORY_CARE_PROVIDER_SITE_OTHER): Payer: Medicare HMO

## 2016-12-11 VITALS — BP 118/68 | HR 58 | Temp 98.1°F | Resp 17 | Ht 69.0 in | Wt 242.0 lb

## 2016-12-11 DIAGNOSIS — Z0000 Encounter for general adult medical examination without abnormal findings: Secondary | ICD-10-CM

## 2016-12-11 DIAGNOSIS — Z23 Encounter for immunization: Secondary | ICD-10-CM | POA: Diagnosis not present

## 2016-12-11 NOTE — Patient Instructions (Addendum)
[name] , Thank you for taking time to come for your Medicare Wellness Visit. I appreciate your ongoing commitment to your health goals. Please review the following plan we discussed and let me know if I can assist you in the future.   Screening recommendations/referrals: Colonoscopy:completed 12/24/2012 Recommended yearly ophthalmology/optometry visit for glaucoma screening and checkup Recommended yearly dental visit for hygiene and checkup  Vaccinations: Influenza vaccine: done today  Pneumococcal vaccine: up to date Tdap vaccine: up to date Shingles vaccine: up to date  Advanced directives:Please bring a copy of your health care power of attorney and living will to the office at your convenience.  Conditions/risks identified: none   Next appointment: Follow up on 12/26/2016 at 8:00am for lab work. Follow up on 01/02/2017 at 9:00am with Dr.Karamalelegos.  Follow up in one year for your annual wellness exam.   Preventive Care 65 Years and Older, Male Preventive care refers to lifestyle choices and visits with your health care provider that can promote health and wellness. What does preventive care include?  A yearly physical exam. This is also called an annual well check.  Dental exams once or twice a year.  Routine eye exams. Ask your health care provider how often you should have your eyes checked.  Personal lifestyle choices, including:  Daily care of your teeth and gums.  Regular physical activity.  Eating a healthy diet.  Avoiding tobacco and drug use.  Limiting alcohol use.  Practicing safe sex.  Taking low doses of aspirin every day.  Taking vitamin and mineral supplements as recommended by your health care provider. What happens during an annual well check? The services and screenings done by your health care provider during your annual well check will depend on your age, overall health, lifestyle risk factors, and family history of disease. Counseling    Your health care provider may ask you questions about your:  Alcohol use.  Tobacco use.  Drug use.  Emotional well-being.  Home and relationship well-being.  Sexual activity.  Eating habits.  History of falls.  Memory and ability to understand (cognition).  Work and work Statistician. Screening  You may have the following tests or measurements:  Height, weight, and BMI.  Blood pressure.  Lipid and cholesterol levels. These may be checked every 5 years, or more frequently if you are over 2 years old.  Skin check.  Lung cancer screening. You may have this screening every year starting at age 59 if you have a 30-pack-year history of smoking and currently smoke or have quit within the past 15 years.  Fecal occult blood test (FOBT) of the stool. You may have this test every year starting at age 36.  Flexible sigmoidoscopy or colonoscopy. You may have a sigmoidoscopy every 5 years or a colonoscopy every 10 years starting at age 69.  Prostate cancer screening. Recommendations will vary depending on your family history and other risks.  Hepatitis C blood test.  Hepatitis B blood test.  Sexually transmitted disease (STD) testing.  Diabetes screening. This is done by checking your blood sugar (glucose) after you have not eaten for a while (fasting). You may have this done every 1-3 years.  Abdominal aortic aneurysm (AAA) screening. You may need this if you are a current or former smoker.  Osteoporosis. You may be screened starting at age 26 if you are at high risk. Talk with your health care provider about your test results, treatment options, and if necessary, the need for more tests. Vaccines  Your health care provider may recommend certain vaccines, such as:  Influenza vaccine. This is recommended every year.  Tetanus, diphtheria, and acellular pertussis (Tdap, Td) vaccine. You may need a Td booster every 10 years.  Zoster vaccine. You may need this after age  74.  Pneumococcal 13-valent conjugate (PCV13) vaccine. One dose is recommended after age 19.  Pneumococcal polysaccharide (PPSV23) vaccine. One dose is recommended after age 2. Talk to your health care provider about which screenings and vaccines you need and how often you need them. This information is not intended to replace advice given to you by your health care provider. Make sure you discuss any questions you have with your health care provider. Document Released: 04/08/2015 Document Revised: 11/30/2015 Document Reviewed: 01/11/2015 Elsevier Interactive Patient Education  2017 [place] Prevention in the Home Falls can cause injuries. They can happen to people of all ages. There are many things you can do to make your home safe and to help prevent falls. What can I do on the outside of my home?  Regularly fix the edges of walkways and driveways and fix any cracks.  Remove anything that might make you trip as you walk through a door, such as a raised step or threshold.  Trim any bushes or trees on the path to your home.  Use bright outdoor lighting.  Clear any walking paths of anything that might make someone trip, such as rocks or tools.  Regularly check to see if handrails are loose or broken. Make sure that both sides of any steps have handrails.  Any raised decks and porches should have guardrails on the edges.  Have any leaves, snow, or ice cleared regularly.  Use sand or salt on walking paths during winter.  Clean up any spills in your garage right away. This includes oil or grease spills. What can I do in the bathroom?  Use night lights.  Install grab bars by the toilet and in the tub and shower. Do not use towel bars as grab bars.  Use non-skid mats or decals in the tub or shower.  If you need to sit down in the shower, use a plastic, non-slip stool.  Keep the floor dry. Clean up any water that spills on the floor as soon as it happens.  Remove  soap buildup in the tub or shower regularly.  Attach bath mats securely with double-sided non-slip rug tape.  Do not have throw rugs and other things on the floor that can make you trip. What can I do in the bedroom?  Use night lights.  Make sure that you have a light by your bed that is easy to reach.  Do not use any sheets or blankets that are too big for your bed. They should not hang down onto the floor.  Have a firm chair that has side arms. You can use this for support while you get dressed.  Do not have throw rugs and other things on the floor that can make you trip. What can I do in the kitchen?  Clean up any spills right away.  Avoid walking on wet floors.  Keep items that you use a lot in easy-to-reach places.  If you need to reach something above you, use a strong step stool that has a grab bar.  Keep electrical cords out of the way.  Do not use floor polish or wax that makes floors slippery. If you must use wax, use non-skid floor wax.  Do  not have throw rugs and other things on the floor that can make you trip. What can I do with my stairs?  Do not leave any items on the stairs.  Make sure that there are handrails on both sides of the stairs and use them. Fix handrails that are broken or loose. Make sure that handrails are as long as the stairways.  Check any carpeting to make sure that it is firmly attached to the stairs. Fix any carpet that is loose or worn.  Avoid having throw rugs at the top or bottom of the stairs. If you do have throw rugs, attach them to the floor with carpet tape.  Make sure that you have a light switch at the top of the stairs and the bottom of the stairs. If you do not have them, ask someone to add them for you. What else can I do to help prevent falls?  Wear shoes that:  Do not have high heels.  Have rubber bottoms.  Are comfortable and fit you well.  Are closed at the toe. Do not wear sandals.  If you use a  stepladder:  Make sure that it is fully opened. Do not climb a closed stepladder.  Make sure that both sides of the stepladder are locked into place.  Ask someone to hold it for you, if possible.  Clearly mark and make sure that you can see:  Any grab bars or handrails.  First and last steps.  Where the edge of each step is.  Use tools that help you move around (mobility aids) if they are needed. These include:  Canes.  Walkers.  Scooters.  Crutches.  Turn on the lights when you go into a dark area. Replace any light bulbs as soon as they burn out.  Set up your furniture so you have a clear path. Avoid moving your furniture around.  If any of your floors are uneven, fix them.  If there are any pets around you, be aware of where they are.  Review your medicines with your doctor. Some medicines can make you feel dizzy. This can increase your chance of falling. Ask your doctor what other things that you can do to help prevent falls. This information is not intended to replace advice given to you by your health care provider. Make sure you discuss any questions you have with your health care provider. Document Released: 01/06/2009 Document Revised: 08/18/2015 Document Reviewed: 04/16/2014 Elsevier Interactive Patient Education  2017 [place].  Influenza (Flu) Vaccine (Live, Intranasal): What You Need to Know 1. Why get vaccinated? Influenza ("flu") is a contagious disease that spreads around the [place] every year, usually between October and May. Flu is caused by influenza viruses, and is spread mainly by coughing, sneezing, and close contact. Anyone can get flu. Flu strikes suddenly and can last several days. Symptoms vary by age, but can include:  fever/chills  sore throat  muscle aches  fatigue  cough  headache  runny or stuffy nose  Flu can also lead to pneumonia and blood infections, and cause diarrhea and seizures in children. If you have a  medical condition, such as heart or lung disease, flu can make it worse. Flu is more dangerous for some people. Infants and young children, people 69 years of age and older, pregnant women, and people with certain health conditions or a weakened immune system are at greatest risk. Each year thousands of people in the [place] die from flu, and many  more are hospitalized. Flu vaccine can:  keep you from getting flu,  make flu less severe if you do get it, and  keep you from spreading flu to your family and other people.  2. Live, attenuated flu vaccine-LAIV, Nasal Spray A dose of flu vaccine is recommended every flu season. Children younger than 12 years of age may need two doses during the same flu season. Everyone else needs only one dose each flu season. The live, attenuated influenza vaccine (called LAIV) may be given to healthy, non-pregnant people 2 through 69 years of age. It may safely be given at the same time as other vaccines. LAIV is sprayed into the nose. LAIV does not contain thimerosal or other preservatives. It is made from weakened flu virus and does not cause flu. There are many flu viruses, and they are always changing. Each year LAIV is made to protect against four viruses that are likely to cause disease in the upcoming flu season. But even when the vaccine doesn't exactly match these viruses, it may still provide some protection. Flu vaccine cannot prevent:  flu that is caused by a virus not covered by the vaccine, or  illnesses that look like flu but are not.  It takes about 2 weeks for protection to develop after vaccination, and protection lasts through the flu season. 3. Some people should not get this vaccine Some people should not get LAIV because of age, health conditions, or other reasons. Most of these people should get an injected flu vaccine instead. Your healthcare provider can help you decide. Tell the provider if you or the person being  vaccinated:  have any allergies, including an allergy to eggs, or have ever had an allergic reaction to an influenza vaccine.  have ever had Guillain-Barr Syndrome (also called GBS).  have any long-term heart, breathing, kidney, liver, or nervous system problems.  have asthma or breathing problems, or are a child who has had wheezing episodes.  are pregnant.  are a child or adolescent who is receiving aspirin or aspirin-containing products.  have a weakened immune system.  will be visiting or taking care of someone, within the next 7 days, who requires a protected environment (for example, following a bone marrow transplant)  Sometimes LAIV should be delayed. Tell the provider if you or the person being vaccinated:  are not feeling well. The vaccine could be delayed until you feel better.  have gotten any other vaccines in the past 4 weeks. Live vaccines given too close together might not work as well.  have taken influenza antiviral medication in the past 48 hours.  have a very stuffy nose.  4. Risks of a vaccine reaction With any medicine, including vaccines, there is a chance of reactions. These are usually mild and go away on their own, but serious reactions are also possible. Most people who get LAIV do not have any problems with it. Reactions to LAIV may resemble a very mild case of flu. Problems that have been reported following LAIV:  Children and adolescents 91-41 years of age: ? runny nose/nasal congestion ? cough ? fever ? headache and muscle aches ? wheezing ? abdominal pain, vomiting, or diarrhea  Adults 87-20 years of age: ? runny nose/nasal congestion ? sore throat ? cough ? chills ? tiredness/weakness ? headache  Problems that could happen after any vaccine:  Any medication can cause a severe allergic reaction. Such reactions from a vaccine are very rare, estimated at about 1 in a million doses,  and would happen within a few minutes to a few hours  after the vaccination. As with any medicine, there is a very small chance of a vaccine causing a serious injury or death. The safety of vaccines is always being monitored. For more information, visit: http://www.aguilar.org/ 5. What if there is a serious reaction? What should I look for? Look for anything that concerns you, such as signs of a severe allergic reaction, very high fever, or unusual behavior. Signs of a severe allergic reaction can include hives, swelling of the face and throat, difficulty breathing, a fast heartbeat, dizziness, and weakness. These would start a few minutes to a few hours after the vaccination. What should I do?  If you think it is a severe allergic reaction or other emergency that can't wait, call 9-1-1 and get the person to the nearest hospital. Otherwise, call your doctor.  Reactions should be reported to the Vaccine Adverse Event Reporting System (VAERS). Your doctor should file this report, or you can do it yourself through the VAERS web site at www.vaers.SamedayNews.es, or by calling 9144413368. ? VAERS does not give medical advice. 6. The National Vaccine Injury Compensation Program The [employer] Vaccine Injury Compensation Program (VICP) is a federal program that was created to compensate people who may have been injured by certain vaccines. Persons who believe they may have been injured by a vaccine can learn about the program and about filing a claim by calling 2482145176 or visiting the [place] website at GoldCloset.com.ee. There is a time limit to file a claim for compensation. 7. How can I learn more?  Ask your healthcare provider. He or she can give you the vaccine package insert or suggest other sources of information.  Call your local or state health department.  Contact the Centers for Disease Control and Prevention (CDC): ? Call (424)161-3407 (1-800-CDC-INFO) or ? Visit CDC's website at https://gibson.com/ Vaccine Information  Statement, Live Attenuated Influenza Vaccine (10/30/2013) This information is not intended to replace advice given to you by your health care provider. Make sure you discuss any questions you have with your health care provider. Document Released: 04/14/2010 Document Revised: 12/01/2015 Document Reviewed: 12/01/2015 Elsevier Interactive Patient Education  2017 [employer].

## 2016-12-11 NOTE — Progress Notes (Signed)
Subjective:   Eric Stone. is a 71 y.o. male who presents for Medicare Annual/Subsequent preventive examination.  Review of Systems:  Cardiac Risk Factors include: male gender;advanced age (>66men, >66 women);obesity (BMI >30kg/m2);hypertension     Objective:    Vitals: BP 118/68 (BP Location: Right Arm, Patient Position: Sitting)   Pulse (!) 58   Temp 98.1 F (36.7 C)   Resp 17   Ht 5\' 9"  (1.753 m)   Wt 242 lb (109.8 kg)   BMI 35.74 kg/m   Body mass index is 35.74 kg/m.  Tobacco History  Smoking Status  . Former Smoker  . Packs/day: 1.00  . Years: 26.00  . Types: Cigarettes  . Quit date: 03/26/1989  Smokeless Tobacco  . Former Systems developer    Comment: less than 1 PPD     Counseling given: Not Answered   Past Medical History:  Diagnosis Date  . Arthritis   . H/O blood clots   . H/O blood clots    in left leg in 1988  . Hypertension   . Rash    occasionally and pt does see a dermatologist  . Sleep apnea    uses a CPAP;sleep study >11yrs ago   Past Surgical History:  Procedure Laterality Date  . TOE SURGERY     melanoma  . 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 VIDEO ASSISTED THORACOSCOPY (VATS)/DECORTICATION Left 05/06/2012   Procedure: VIDEO ASSISTED THORACOSCOPY (VATS) drainage loculated pleural effusion;  Surgeon: Grace Isaac, MD;  Location: [facility];  Service: Thoracic;  Laterality: Left;  Marland Kitchen VIDEO BRONCHOSCOPY N/A 05/06/2012   Procedure: VIDEO BRONCHOSCOPY;  Surgeon: Grace Isaac, MD;  Location: [facility] OR;  Service: Thoracic;  Laterality: N/A;   Family History  Problem Relation Age of Onset  . Hypertension Mother   . Stroke Mother        possible??   History  Sexual Activity  . Sexual activity: Yes    Outpatient Encounter Prescriptions as of 12/11/2016  Medication Sig  . aspirin EC 81 MG tablet Take 81 mg by mouth.  Marland Kitchen lisinopril (PRINIVIL,ZESTRIL) 30 MG tablet Take 1 tablet (30 mg total) by mouth daily. Take 1, 30 mg. Tab each day.    . pimecrolimus (ELIDEL) 1 % cream Apply topically.   No facility-administered encounter medications on file as of 12/11/2016.     Activities of Daily Living In your present state of health, do you have any difficulty performing the following activities: 12/11/2016 09/27/2016  Hearing? N N  Vision? N N  Difficulty concentrating or making decisions? N N  Walking or climbing stairs? N N  Dressing or bathing? N N  Doing errands, shopping? N N  Preparing Food and eating ? N -  Using the Toilet? N -  In the past six months, have you accidently leaked urine? N -  Do you have problems with loss of bowel control? N -  Managing your Medications? N -  Managing your Finances? N -  Housekeeping or managing your Housekeeping? N -  Some recent data might be hidden    Patient Care Team: Olin Hauser, DO as PCP - General (Family Medicine) Luan Pulling, Ronelle Nigh., MD (Unknown Physician Specialty) Grace Isaac, MD as Consulting Physician (Cardiothoracic Surgery) Clance, Armando Reichert, MD as Consulting Physician (Pulmonary Disease) Grace Isaac, MD as Consulting Physician (Cardiothoracic Surgery) Clance, Armando Reichert, MD as Consulting Physician (Pulmonary Disease)   Assessment:     Exercise  Activities and Dietary recommendations Current Exercise Habits: The patient has a physically strenous job, but has no regular exercise apart from work., Exercise limited by: None identified  Goals    . Exercise 3x per week (30 min per time)          Will increase execise and eat more veggies.       Fall Risk Fall Risk  12/11/2016 02/09/2016 07/11/2015 02/10/2015 12/31/2014  Falls in the past year? No No No No No   Depression Screen PHQ 2/9 Scores 12/11/2016 02/09/2016 07/11/2015 02/10/2015  PHQ - 2 Score 0 0 0 0    Cognitive Function MMSE - Mini Mental State Exam 12/31/2014  Orientation to time 5  Orientation to Place 5  Registration 3  Attention/ Calculation 5  Recall 3  Language- name 2  objects 2  Language- repeat 1  Language- follow 3 step command 3  Language- read & follow direction 1  Write a sentence 1  Copy design 1  Total score 30     6CIT Screen 12/11/2016  What Year? 0 points  What month? 0 points  What time? 0 points  Count back from 20 0 points  Months in reverse 0 points  Repeat phrase 0 points  Total Score 0    Immunization History  Administered Date(s) Administered  . Influenza, High Dose Seasonal PF 12/31/2014, 12/11/2016  . Influenza-Unspecified 02/21/2011  . Pneumococcal Conjugate-13 01/07/2014  . Pneumococcal Polysaccharide-23 03/26/2012  . Tdap 06/07/2014  . Zoster 03/26/2010   Screening Tests Health Maintenance  Topic Date Due  . Hepatitis C Screening  12/26/2016 (Originally 10-01-1945)  . COLONOSCOPY  12/25/2022  . TETANUS/TDAP  06/06/2024  . INFLUENZA VACCINE  Completed  . PNA vac Low Risk Adult  Completed      Plan:    I have personally reviewed and addressed the Medicare Annual Wellness questionnaire and have noted the following in the patient's chart:  A. Medical and social history B. Use of alcohol, tobacco or illicit drugs  C. Current medications and supplements D. Functional ability and status E.  Nutritional status F.  Physical activity G. Advance directives H. List of other physicians I.  Hospitalizations, surgeries, and ER visits in previous 12 months J.  [place] such as hearing and vision if needed, cognitive and depression L. Referrals and appointments   In addition, I have reviewed and discussed with patient certain preventive protocols, quality metrics, and best practice recommendations. A written personalized care plan for preventive services as well as general preventive health recommendations were provided to patient.   Signed,  Tyler Aas, LPN Nurse Health Advisor   MD Recommendations:

## 2016-12-25 ENCOUNTER — Other Ambulatory Visit: Payer: Medicare HMO

## 2016-12-26 ENCOUNTER — Other Ambulatory Visit: Payer: Medicare HMO

## 2016-12-26 DIAGNOSIS — Z8546 Personal history of malignant neoplasm of prostate: Secondary | ICD-10-CM

## 2016-12-26 DIAGNOSIS — I7090 Unspecified atherosclerosis: Secondary | ICD-10-CM | POA: Diagnosis not present

## 2016-12-26 DIAGNOSIS — L812 Freckles: Secondary | ICD-10-CM | POA: Diagnosis not present

## 2016-12-26 DIAGNOSIS — Z85820 Personal history of malignant melanoma of skin: Secondary | ICD-10-CM | POA: Diagnosis not present

## 2016-12-26 DIAGNOSIS — D229 Melanocytic nevi, unspecified: Secondary | ICD-10-CM | POA: Diagnosis not present

## 2016-12-26 DIAGNOSIS — I8393 Asymptomatic varicose veins of bilateral lower extremities: Secondary | ICD-10-CM | POA: Diagnosis not present

## 2016-12-26 DIAGNOSIS — C61 Malignant neoplasm of prostate: Secondary | ICD-10-CM

## 2016-12-26 DIAGNOSIS — D485 Neoplasm of uncertain behavior of skin: Secondary | ICD-10-CM | POA: Diagnosis not present

## 2016-12-26 DIAGNOSIS — Z1159 Encounter for screening for other viral diseases: Secondary | ICD-10-CM

## 2016-12-26 DIAGNOSIS — L821 Other seborrheic keratosis: Secondary | ICD-10-CM | POA: Diagnosis not present

## 2016-12-26 DIAGNOSIS — Z85828 Personal history of other malignant neoplasm of skin: Secondary | ICD-10-CM | POA: Diagnosis not present

## 2016-12-26 DIAGNOSIS — L299 Pruritus, unspecified: Secondary | ICD-10-CM | POA: Diagnosis not present

## 2016-12-26 DIAGNOSIS — R7303 Prediabetes: Secondary | ICD-10-CM

## 2016-12-26 DIAGNOSIS — I10 Essential (primary) hypertension: Secondary | ICD-10-CM

## 2016-12-26 DIAGNOSIS — L578 Other skin changes due to chronic exposure to nonionizing radiation: Secondary | ICD-10-CM | POA: Diagnosis not present

## 2016-12-26 DIAGNOSIS — L290 Pruritus ani: Secondary | ICD-10-CM | POA: Diagnosis not present

## 2016-12-27 LAB — COMPLETE METABOLIC PANEL WITH GFR
AG Ratio: 2 (calc) (ref 1.0–2.5)
ALT: 21 U/L (ref 9–46)
AST: 19 U/L (ref 10–35)
Albumin: 4.3 g/dL (ref 3.6–5.1)
Alkaline phosphatase (APISO): 62 U/L (ref 40–115)
BUN: 18 mg/dL (ref 7–25)
CALCIUM: 9 mg/dL (ref 8.6–10.3)
CO2: 27 mmol/L (ref 20–32)
Chloride: 105 mmol/L (ref 98–110)
Creat: 1.05 mg/dL (ref 0.70–1.25)
GFR, EST AFRICAN AMERICAN: 84 mL/min/{1.73_m2} (ref >=60)
GFR, EST NON AFRICAN AMERICAN: 72 mL/min/{1.73_m2} (ref >=60)
GLUCOSE: 108 mg/dL — AB (ref 65–99)
Globulin: 2.2 g/dL (calc) (ref 1.9–3.7)
POTASSIUM: 3.9 mmol/L (ref 3.5–5.3)
Sodium: 142 mmol/L (ref 135–146)
TOTAL PROTEIN: 6.5 g/dL (ref 6.1–8.1)
Total Bilirubin: 0.7 mg/dL (ref 0.2–1.2)

## 2016-12-27 LAB — CBC WITH DIFFERENTIAL/PLATELET
Basophils Absolute: 39 cells/uL (ref 0–200)
Basophils Relative: 0.7 %
EOS ABS: 330 {cells}/uL (ref 15–500)
Eosinophils Relative: 6 %
HCT: 42.7 % (ref 38.5–50.0)
Hemoglobin: 14.8 g/dL (ref 13.2–17.1)
Lymphs Abs: 1084 cells/uL (ref 850–3900)
MCH: 30.6 pg (ref 27.0–33.0)
MCHC: 34.7 g/dL (ref 32.0–36.0)
MCV: 88.2 fL (ref 80.0–100.0)
MONOS PCT: 8.8 %
MPV: 10.8 fL (ref 7.5–12.5)
Neutro Abs: 3564 cells/uL (ref 1500–7800)
Neutrophils Relative %: 64.8 %
Platelets: 202 10*3/uL (ref 140–400)
RBC: 4.84 10*6/uL (ref 4.20–5.80)
RDW: 12.9 % (ref 11.0–15.0)
Total Lymphocyte: 19.7 %
WBC mixed population: 484 cells/uL (ref 200–950)
WBC: 5.5 10*3/uL (ref 3.8–10.8)

## 2016-12-27 LAB — LIPID PANEL
Cholesterol: 166 mg/dL (ref <200)
HDL: 43 mg/dL (ref >40)
LDL CHOLESTEROL (CALC): 103 mg/dL — AB
NON-HDL CHOLESTEROL (CALC): 123 mg/dL (ref <130)
TRIGLYCERIDES: 102 mg/dL (ref <150)
Total CHOL/HDL Ratio: 3.9 (calc) (ref <5.0)

## 2016-12-27 LAB — HEPATITIS C ANTIBODY
HEP C AB: NONREACTIVE
SIGNAL TO CUT-OFF: 0.01 (ref <1.00)

## 2016-12-27 LAB — HEMOGLOBIN A1C
Hgb A1c MFr Bld: 6 % of total Hgb — ABNORMAL HIGH (ref <5.7)
MEAN PLASMA GLUCOSE: 126 (calc)
eAG (mmol/L): 7 (calc)

## 2017-01-02 ENCOUNTER — Ambulatory Visit (INDEPENDENT_AMBULATORY_CARE_PROVIDER_SITE_OTHER): Payer: Medicare HMO | Admitting: Family Medicine

## 2017-01-02 ENCOUNTER — Other Ambulatory Visit: Payer: Self-pay | Admitting: Family Medicine

## 2017-01-02 ENCOUNTER — Encounter: Payer: Self-pay | Admitting: Family Medicine

## 2017-01-02 VITALS — BP 130/65 | HR 61 | Temp 98.2°F | Resp 16 | Ht 69.0 in | Wt 246.6 lb

## 2017-01-02 DIAGNOSIS — I10 Essential (primary) hypertension: Secondary | ICD-10-CM | POA: Diagnosis not present

## 2017-01-02 DIAGNOSIS — E7800 Pure hypercholesterolemia, unspecified: Secondary | ICD-10-CM

## 2017-01-02 DIAGNOSIS — Z9989 Dependence on other enabling machines and devices: Secondary | ICD-10-CM

## 2017-01-02 DIAGNOSIS — Z8546 Personal history of malignant neoplasm of prostate: Secondary | ICD-10-CM

## 2017-01-02 DIAGNOSIS — R7303 Prediabetes: Secondary | ICD-10-CM | POA: Diagnosis not present

## 2017-01-02 DIAGNOSIS — Z23 Encounter for immunization: Secondary | ICD-10-CM

## 2017-01-02 DIAGNOSIS — Z85820 Personal history of malignant melanoma of skin: Secondary | ICD-10-CM

## 2017-01-02 DIAGNOSIS — G4733 Obstructive sleep apnea (adult) (pediatric): Secondary | ICD-10-CM | POA: Diagnosis not present

## 2017-01-02 DIAGNOSIS — E785 Hyperlipidemia, unspecified: Secondary | ICD-10-CM

## 2017-01-02 DIAGNOSIS — E1169 Type 2 diabetes mellitus with other specified complication: Secondary | ICD-10-CM | POA: Insufficient documentation

## 2017-01-02 MED ORDER — LISINOPRIL 30 MG PO TABS: 30.0000 mg | ORAL_TABLET | Freq: Every day | ORAL | 3 refills | Status: DC

## 2017-01-02 MED ORDER — ROSUVASTATIN CALCIUM 10 MG PO TABS: 10.0000 mg | ORAL_TABLET | Freq: Every day | ORAL | 11 refills | Status: DC

## 2017-01-02 MED ORDER — ZOSTER VAC RECOMB ADJUVANTED 50 MCG/0.5ML IM SUSR: INTRAMUSCULAR | 1 refills | Status: DC

## 2017-01-02 NOTE — Progress Notes (Signed)
Subjective:    Patient ID: Eric Daft., male    DOB: 04-22-1945, 71 y.o.   MRN: [id number]  Eric Stone. is a 71 y.o. male presenting on 01/02/2017 for Annual Exam   HPI  Specialists: Radiation Oncology - Dr Baruch Gouty (prostate cancer) Dermatology - Dr Nehemiah Massed (general derm and skin cancer history) Surgical Oncology/Dermatology - Dr Andi Devon [facility], L great toe melanoma)  Here for Annual Physical and Lab Review.  CHRONIC HTN: Reports no concerns, rarely checks BP at home. Current Meds - Lisinopril 30mg  daily   Reports good compliance, took meds today. Tolerating well, w/o complaints. - Admits mild varicose veins edema, but improved Denies CP, dyspnea, HA, dizziness / lightheadedness  Pre-Diabetes: Reports doing well. Pleased with A1c down to 6.0 from previous 6.4 CBGs Never checked. Meds: never on meds Currently on ACEi Lifestyle: - Diet (Improved diet, healthier food choices lower carbs, smaller portions, inc water less soda) - Exercise (No significant change in exercise since last visit, limited regular exercise, had limitation in past with foot) - No known history of Hyperlipidemia or abnormal cholesterol, last result Aug 04, 2015 was normal. Never on statin or cholesterol med Denies hypoglycemia, polyuria, visual changes, numbness or tingling.  Mild Hyperlipidemia: - Reports no concerns. Last lipid panel 12/2016, controlled on lifestyle, only mild elevated LDL - He takes ASA 81mg  daily for risk reduction - never on Statin   OSA, on CPAP - Patient reports prior history of dx OSA and on CPAP for >20 years, prior to treatment his initial symptoms were snoring, daytime sleepiness and fatigue, he has had several sleep studies in the past. Most recent 08/04/14, with AHI 6.8 per hour, and mild to moderate OSA. - He reports that his sleep apnea is well controlled. He uses the CPAP machine every night. He tolerates the machine well, and thinks that he sleeps better with it  and feels good. No new concerns or symptoms.  PMH - Melanoma, Left big toe / History of Skin Cancer - Prostate CA, s/p radiation therapy - followed closely by Rad Onc Dr Baruch Gouty for PSA monitoring, had lower PSA now q 6 month testing  Health Maintenance: - UTD on Flu Shot - Last Zostavax 2012, due for update Shingrix, patient interested, request order for pharmacy - UTD Colonoscopy 2014, reported. Do not have record or report available to me at this time. - UTD TDap 2016 - Completed routine Hep C screening, Negative 12/2016 - UTD Pneumonia vaccine, received Pneumovax-23 at age 26 (2014), and then next dose was Prvnar-13 12/2013, has received both after age 51, therefore completed  Depression screen [facility] 2/9 01/02/2017 12/11/2016 02/09/2016  Decreased Interest 0 0 0  Down, Depressed, Hopeless 0 0 0  PHQ - 2 Score 0 0 0    Past Medical History:  Diagnosis Date  . Arthritis   . H/O blood clots   . H/O blood clots    in left leg in 1988  . Hypertension   . Rash    occasionally and pt does see a dermatologist  . Sleep apnea    uses a CPAP;sleep study >47yrs ago   Past Surgical History:  Procedure Laterality Date  . TOE SURGERY     melanoma  . 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 VIDEO ASSISTED THORACOSCOPY (VATS)/DECORTICATION Left 05/06/2012   Procedure: VIDEO ASSISTED THORACOSCOPY (VATS) drainage loculated pleural effusion;  Surgeon: Grace Isaac, MD;  Location: [place];  Service: Thoracic;  Laterality:  Left;  . VIDEO BRONCHOSCOPY N/A 05/06/2012   Procedure: VIDEO BRONCHOSCOPY;  Surgeon: Grace Isaac, MD;  Location: [facility] OR;  Service: Thoracic;  Laterality: N/A;   Social History   Social History  . Marital status: Married    Spouse name: N/A  . Number of children: N/A  . Years of education: N/A   Occupational History  . retired    Social History Main Topics  . Smoking status: Former Smoker    Packs/day: 1.00    Years: 26.00    Types: Cigarettes     Quit date: 03/26/1989  . Smokeless tobacco: Former Systems developer     Comment: less than 1 PPD  . Alcohol use No  . Drug use: No  . Sexual activity: Yes   Other Topics Concern  . Not on file   Social History Narrative  . No narrative on file   Family History  Problem Relation Age of Onset  . Hypertension Mother   . Stroke Mother        possible   Current Outpatient Prescriptions on File Prior to Visit  Medication Sig  . aspirin EC 81 MG tablet Take 81 mg by mouth.  . pimecrolimus (ELIDEL) 1 % cream Apply topically.   No current facility-administered medications on file prior to visit.     Review of Systems  Constitutional: Negative for activity change, appetite change, chills, diaphoresis, fatigue and fever.  HENT: Negative for congestion, hearing loss and sinus pressure.   Eyes: Negative for visual disturbance.  Respiratory: Negative for apnea, cough, choking, chest tightness, shortness of breath and wheezing.   Cardiovascular: Negative for chest pain, palpitations and leg swelling.  Gastrointestinal: Negative for abdominal pain, anal bleeding, blood in stool, constipation, diarrhea, nausea and vomiting.  Endocrine: Negative for cold intolerance and polyuria.  Genitourinary: Negative for decreased urine volume, difficulty urinating, dysuria, frequency, hematuria, testicular pain and urgency.  Musculoskeletal: Negative for arthralgias and neck pain.  Skin: Negative for rash.  Allergic/Immunologic: Negative for environmental allergies.  Neurological: Negative for dizziness, weakness, light-headedness, numbness and headaches.  Hematological: Negative for adenopathy.  Psychiatric/Behavioral: Negative for behavioral problems, dysphoric mood and sleep disturbance. The patient is not nervous/anxious.    Per HPI unless specifically indicated above     Objective:    BP 130/65   Pulse 61   Temp 98.2 F (36.8 C) (Oral)   Resp 16   Ht 5\' 9"  (1.753 m)   Wt 246 lb 9.6 oz (111.9 kg)    BMI 36.42 kg/m   Wt Readings from Last 3 Encounters:  01/02/17 246 lb 9.6 oz (111.9 kg)  12/11/16 242 lb (109.8 kg)  09/27/16 244 lb (110.7 kg)    Physical Exam  Constitutional: He is oriented to person, place, and time. He appears well-developed and well-nourished. No distress.  Well-appearing, comfortable, cooperative  HENT:  Head: Normocephalic and atraumatic.  Mouth/Throat: Oropharynx is clear and moist.  Eyes: Pupils are equal, round, and reactive to light. Conjunctivae and EOM are normal. Right eye exhibits no discharge. Left eye exhibits no discharge.  Neck: Normal range of motion. Neck supple. No thyromegaly present.  Cardiovascular: Normal rate, regular rhythm, normal heart sounds and intact distal pulses.   No murmur heard. Pulmonary/Chest: Effort normal and breath sounds normal. No respiratory distress. He has no wheezes. He has no rales.  Abdominal: Soft. Bowel sounds are normal. He exhibits no distension and no mass. There is no tenderness.  Genitourinary:  Genitourinary Comments: Deferred DRE, followed by  other provider  Musculoskeletal: Normal range of motion. He exhibits no edema or tenderness.  Upper / Lower Extremities: - Normal muscle tone, strength bilateral upper extremities 5/5, lower extremities 5/5  Lymphadenopathy:    He has no cervical adenopathy.  Neurological: He is alert and oriented to person, place, and time.  Distal sensation intact to light touch all extremities  Skin: Skin is warm and dry. No rash noted. He is not diaphoretic. No erythema.  Psychiatric: He has a normal mood and affect. His behavior is normal.  Well groomed, good eye contact, normal speech and thoughts  Nursing note and vitals reviewed.  Results for orders placed or performed in visit on 12/26/16  COMPLETE METABOLIC PANEL WITH GFR  Result Value Ref Range   Glucose, Bld 108 (H) 65 - 99 mg/dL   BUN 18 7 - 25 mg/dL   Creat 1.05 0.70 - 1.25 mg/dL   GFR, Est Non African American 72  > OR = 60 mL/min/1.15m2   GFR, Est African American 84 > OR = 60 mL/min/1.34m2   BUN/Creatinine Ratio NOT APPLICABLE 6 - 22 (calc)   Sodium 142 135 - 146 mmol/L   Potassium 3.9 3.5 - 5.3 mmol/L   Chloride 105 98 - 110 mmol/L   CO2 27 20 - 32 mmol/L   Calcium 9.0 8.6 - 10.3 mg/dL   Total Protein 6.5 6.1 - 8.1 g/dL   Albumin 4.3 3.6 - 5.1 g/dL   Globulin 2.2 1.9 - 3.7 g/dL (calc)   AG Ratio 2.0 1.0 - 2.5 (calc)   Total Bilirubin 0.7 0.2 - 1.2 mg/dL   Alkaline phosphatase (APISO) 62 40 - 115 U/L   AST 19 10 - 35 U/L   ALT 21 9 - 46 U/L  CBC with Differential/Platelet  Result Value Ref Range   WBC 5.5 3.8 - 10.8 Thousand/uL   RBC 4.84 4.20 - 5.80 Million/uL   Hemoglobin 14.8 13.2 - 17.1 g/dL   HCT 42.7 38.5 - 50.0 %   MCV 88.2 80.0 - 100.0 fL   MCH 30.6 27.0 - 33.0 pg   MCHC 34.7 32.0 - 36.0 g/dL   RDW 12.9 11.0 - 15.0 %   Platelets 202 140 - 400 Thousand/uL   MPV 10.8 7.5 - 12.5 fL   Neutro Abs 3,564 1,500 - 7,800 cells/uL   Lymphs Abs 1,084 850 - 3,900 cells/uL   WBC mixed population 484 200 - 950 cells/uL   Eosinophils Absolute 330 15 - 500 cells/uL   Basophils Absolute 39 0 - 200 cells/uL   Neutrophils Relative % 64.8 %   Total Lymphocyte 19.7 %   Monocytes Relative 8.8 %   Eosinophils Relative 6.0 %   Basophils Relative 0.7 %  Lipid panel  Result Value Ref Range   Cholesterol 166 <200 mg/dL   HDL 43 >40 mg/dL   Triglycerides 102 <150 mg/dL   LDL Cholesterol (Calc) 103 (H) mg/dL (calc)   Total CHOL/HDL Ratio 3.9 <5.0 (calc)   Non-HDL Cholesterol (Calc) 123 <130 mg/dL (calc)  Hemoglobin A1c  Result Value Ref Range   Hgb A1c MFr Bld 6.0 (H) <5.7 % of total Hgb   Mean Plasma Glucose 126 (calc)   eAG (mmol/L) 7.0 (calc)  Hepatitis C antibody  Result Value Ref Range   Hepatitis C Ab NON-REACTIVE NON-REACTI   SIGNAL TO CUT-OFF 0.01 <1.00      Assessment & Plan:   Problem List Items Addressed This Visit    Essential hypertension  Well-controlled HTN - Home BP  readings normal, rare check No known complications    Plan:  1. Continue current BP regimen - Lisinopril 30mg  daily 2. Encourage improved lifestyle - low sodium diet, improve regular exercise 3. Continue monitor BP outside office, bring readings to next visit, if persistently >140/90 or new symptoms notify office sooner 4. Follow-up 1 year for annual phys + labs      Relevant Medications   rosuvastatin (CRESTOR) 10 MG tablet   lisinopril (PRINIVIL,ZESTRIL) 30 MG tablet   Hyperlipidemia    Controlled cholesterol on lifestyle Last lipid panel 12/2016 Calculated ASCVD 10 yr risk score elevated at 19%, due to age, HTN, among other risk factors  Plan: 1. Discussion on reducing ASCVD risk - reviewed rec to add Statin therapy - he agrees to start this, reviewed counseling potential side effects, dosing regimen adjustment if side effects - Start Rosuvastatin 10mg  nightly #30 +11 refills - adjust as needed either half tab nightly if myalgia, or can do half tab TIW if need 2. Continue ASA 81mg  for primary ASCVD risk reduction 3. Encourage improved lifestyle - low carb/cholesterol, reduce portion size, continue improving regular exercise 4. Follow-up 1 year annual + labs       Relevant Medications   rosuvastatin (CRESTOR) 10 MG tablet   lisinopril (PRINIVIL,ZESTRIL) 30 MG tablet   OSA on CPAP    Well controlled, chronic mild-mod OSA on CPAP - Good adherence to CPAP nightly - Continue current CPAP therapy, patient is benefiting from therapy      Pre-diabetes - Primary    Well-controlled Pre-DM with A1c 6.0 (improved from 6.4)  Plan:  1. Not on any therapy currently  - remain off 2. Encourage improved lifestyle - low carb, low sugar diet, reduce portion size, continue improving regular exercise 3. Follow-up yearly annual + labs       Other Visit Diagnoses    Need for shingles vaccine     Last zostavax 2012 Discussed benefits of new vaccine Printed order, patient to go to  pharmacy, repeat dose 1-6 mo     Relevant Medications   Zoster Vac Recomb Adjuvanted [facility]) injection      Meds ordered this encounter  Medications  . EUCRISA 2 % OINT  . rosuvastatin (CRESTOR) 10 MG tablet    Sig: Take 1 tablet (10 mg total) by mouth at bedtime.    Dispense:  30 tablet    Refill:  11  . lisinopril (PRINIVIL,ZESTRIL) 30 MG tablet    Sig: Take 1 tablet (30 mg total) by mouth daily. Take 1, 30 mg. Tab each day.    Dispense:  90 tablet    Refill:  3    Keep refills on file  . Zoster Vac Recomb Adjuvanted [facility]) injection    Sig: Inject 0.5mg  mL vaccine IM once then repeat dose with refill within 6 months    Dispense:  0.5 mL    Refill:  1   Follow up plan: Return in about 1 year (around 01/02/2018) for Medicare Physical.  Future labs ordered for 12/2017  [name], [facility] 01/02/2017, 11:55 AM

## 2017-01-02 NOTE — Patient Instructions (Addendum)
Thank you for coming to the clinic today.  1. Great lab results overall. A1c down to 6.0, previously 6.4, still in pre-diabetic range Keep up the good work  2. Minimally elevated cholesterol, overall as discussed your risk of developing heart attack or stroke or cardiovascular problem is slightly elevated compared to average population, due to age, blood pressure, among other risk factors - Start Rosuvastatin 10mg  nightly to reduce your risk, if you develop muscle aches and joint pains then you can stop taking for few days to week until back to normal, then try cutting tablet in HALF for 5mg  dose nightly, if still doesn't work then can try HALF tablet 3 times a week only, so skip every other day  Refilled Lisinopril for 1 year supply  DUE for FASTING BLOOD WORK (no food or drink after midnight before the lab appointment, only water or coffee without cream/sugar on the morning of)  SCHEDULE "Lab Only" visit in the morning at the clinic for lab draw in 1 YEAR  - Make sure Lab Only appointment is at about 1 week before your next appointment, so that results will be available  For Lab Results, once available within 2-3 days of blood draw, you can can log in to MyChart online to view your results and a brief explanation. Also, we can discuss results at next follow-up visit.  Please schedule a Follow-up Appointment to: Return in about 1 year (around 01/02/2018) for Medicare Physical.  If you have any other questions or concerns, please feel free to call the clinic or send a message through [place]. You may also schedule an earlier appointment if necessary.  Additionally, you may be receiving a survey about your experience at our clinic within a few days to 1 week by e-mail or mail. We value your feedback.  [name], DO [place]

## 2017-01-02 NOTE — Assessment & Plan Note (Signed)
Well-controlled Pre-DM with A1c 6.0 (improved from 6.4)  Plan:  1. Not on any therapy currently  - remain off 2. Encourage improved lifestyle - low carb, low sugar diet, reduce portion size, continue improving regular exercise 3. Follow-up yearly annual + labs

## 2017-01-02 NOTE — Assessment & Plan Note (Signed)
Controlled cholesterol on lifestyle Last lipid panel 12/2016 Calculated ASCVD 10 yr risk score elevated at 19%, due to age, HTN, among other risk factors  Plan: 1. Discussion on reducing ASCVD risk - reviewed rec to add Statin therapy - he agrees to start this, reviewed counseling potential side effects, dosing regimen adjustment if side effects - Start Rosuvastatin 10mg  nightly #30 +11 refills - adjust as needed either half tab nightly if myalgia, or can do half tab TIW if need 2. Continue ASA 81mg  for primary ASCVD risk reduction 3. Encourage improved lifestyle - low carb/cholesterol, reduce portion size, continue improving regular exercise 4. Follow-up 1 year annual + labs

## 2017-01-02 NOTE — Assessment & Plan Note (Signed)
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 

## 2017-01-02 NOTE — Assessment & Plan Note (Addendum)
Well-controlled HTN - Home BP readings normal, rare check No known complications    Plan:  1. Continue current BP regimen - Lisinopril 30mg  daily 2. Encourage improved lifestyle - low sodium diet, improve regular exercise 3. Continue monitor BP outside office, bring readings to next visit, if persistently >140/90 or new symptoms notify office sooner 4. Follow-up 1 year for annual phys + labs

## 2017-01-31 ENCOUNTER — Other Ambulatory Visit: Payer: PPO

## 2017-02-07 ENCOUNTER — Ambulatory Visit: Payer: PPO | Admitting: Radiation Oncology

## 2017-04-17 DIAGNOSIS — C4372 Malignant melanoma of left lower limb, including hip: Secondary | ICD-10-CM | POA: Diagnosis not present

## 2017-04-30 DIAGNOSIS — G4733 Obstructive sleep apnea (adult) (pediatric): Secondary | ICD-10-CM | POA: Diagnosis not present

## 2017-05-06 ENCOUNTER — Ambulatory Visit (INDEPENDENT_AMBULATORY_CARE_PROVIDER_SITE_OTHER): Payer: Medicare HMO | Admitting: Family Medicine

## 2017-05-06 ENCOUNTER — Encounter: Payer: Self-pay | Admitting: Family Medicine

## 2017-05-06 VITALS — BP 130/70 | HR 70 | Temp 98.5°F | Resp 16 | Ht 69.0 in | Wt 252.0 lb

## 2017-05-06 DIAGNOSIS — J019 Acute sinusitis, unspecified: Secondary | ICD-10-CM | POA: Diagnosis not present

## 2017-05-06 DIAGNOSIS — J069 Acute upper respiratory infection, unspecified: Secondary | ICD-10-CM | POA: Diagnosis not present

## 2017-05-06 MED ORDER — BENZONATATE 100 MG PO CAPS: 100.0000 mg | ORAL_CAPSULE | Freq: Three times a day (TID) | ORAL | 0 refills | Status: DC | PRN

## 2017-05-06 MED ORDER — IPRATROPIUM BROMIDE 0.06 % NA SOLN: 2.0000 | Freq: Four times a day (QID) | NASAL | 0 refills | Status: DC

## 2017-05-06 NOTE — Patient Instructions (Addendum)
Thank you for coming to the office today.  1. It sounds like you have persistent Sinus Congestion or "Rhinosinusitis" - I do not think that this is a Bacterial Sinus Infection. Usually these are caused by Viruses or Allergies, and will run it's course in about 7 to 10 days. - No antibiotics are needed at this time  Start Atrovent nasal spray decongestant 2 sprays in each nostril up to 4 times daily for 7 days  - Start Tessalon Perls take 1 capsule up to 3 times a day as needed for cough  Start OTC Mucinex regular without additives twice daily for 7 days to clear congestion  - Improve hydration by drinking plenty of clear fluids (water, gatorade) to reduce secretions and thin congestion  - Congestion draining down throat can cause irritation. May try warm herbal tea with honey, cough drops  - Can take Tylenol or Ibuprofen as needed for fevers  If not improved by Weds/Thurs call office consider antibiotic  If you develop persistent fever >101F for at least 3 consecutive days, headaches with sinus pain or pressure or persistent earache, please schedule a follow-up evaluation within next few days to week.   Please schedule a Follow-up Appointment to: Return if symptoms worsen or fail to improve, for sinus.    If you have any other questions or concerns, please feel free to call the office or send a message through [place]. You may also schedule an earlier appointment if necessary.  Additionally, you may be receiving a survey about your experience at our office within a few days to 1 week by e-mail or mail. We value your feedback.  [name], DO [place]

## 2017-05-06 NOTE — Progress Notes (Signed)
Subjective:    Patient ID: Eric Daft., male    DOB: 04-Jun-1945, 72 y.o.   MRN: [id number]  Eric Krakowski. is a 72 y.o. male presenting on 05/06/2017 for Nasal Congestion (gets worst in morning but ware off during the day no chills or fever onset 5 days)  Patient presents for a same day appointment.  HPI   ACUTE URI COUGH / CONGESTION / RHINOSINUSITIS Reports symptoms started about 5-6 days ago with sinus congestion and pressure onset in morning worse, and then some mild improvement during day. He states symptoms not worsening or improved, but persistent. Not tried any OTC for relief. No recent URI. Only recent sick contact with wife URI viral symptoms, now improved - Admits some sore throat, mildly productive cough at times worse in AM - Denies any fevers/chills, sweats, body aches, abdominal pain, nausea vomiting, diarrhea, ear pain or pressure  Health Maintenance: UTD Flu Shot 11/2016  Depression screen [facility] 2/9 01/02/2017 12/11/2016 02/09/2016  Decreased Interest 0 0 0  Down, Depressed, Hopeless 0 0 0  PHQ - 2 Score 0 0 0    Social History   Tobacco Use  . Smoking status: Former Smoker    Packs/day: 1.00    Years: 26.00    Pack years: 26.00    Types: Cigarettes    Last attempt to quit: 03/26/1989    Years since quitting: 28.1  . Smokeless tobacco: Former Systems developer  . Tobacco comment: less than 1 PPD  Substance Use Topics  . Alcohol use: No    Alcohol/week: 0.0 oz  . Drug use: No    Review of Systems Per HPI unless specifically indicated above     Objective:    BP 130/70 (BP Location: Left Arm, Cuff Size: Normal)   Pulse 70   Temp 98.5 F (36.9 C) (Oral)   Resp 16   Ht 5\' 9"  (1.753 m)   Wt 252 lb (114.3 kg)   SpO2 97%   BMI 37.21 kg/m   Wt Readings from Last 3 Encounters:  05/06/17 252 lb (114.3 kg)  01/02/17 246 lb 9.6 oz (111.9 kg)  12/11/16 242 lb (109.8 kg)    Physical Exam  Constitutional: He is oriented to person, place, and time. He appears  well-developed and well-nourished. No distress.  Well-appearing, comfortable, cooperative  HENT:  Head: Normocephalic and atraumatic.  Mouth/Throat: Oropharynx is clear and moist.  Frontal / maxillary sinuses non-tender. Nares with mild turbinate edema with congestion without purulence. Bilateral TMs clear without erythema, effusion or bulging. Oropharynx clear without erythema, exudates, edema or asymmetry.  Eyes: Conjunctivae are normal. Right eye exhibits no discharge. Left eye exhibits no discharge.  Neck: Normal range of motion. Neck supple. No thyromegaly present.  Cardiovascular: Normal rate, regular rhythm, normal heart sounds and intact distal pulses.  No murmur heard. Pulmonary/Chest: Effort normal. No respiratory distress. He has no wheezes. He has no rales.  Mild lower left rhonchi clear with cough, otherwise good air movement. No coughing spells  Musculoskeletal: Normal range of motion. He exhibits no edema.  Lymphadenopathy:    He has no cervical adenopathy.  Neurological: He is alert and oriented to person, place, and time.  Skin: Skin is warm and dry. No rash noted. He is not diaphoretic. No erythema.  Psychiatric: His behavior is normal.  Nursing note and vitals reviewed.  Results for orders placed or performed in visit on 12/26/16  COMPLETE METABOLIC PANEL WITH GFR  Result Value Ref Range  Glucose, Bld 108 (H) 65 - 99 mg/dL   BUN 18 7 - 25 mg/dL   Creat 1.05 0.70 - 1.25 mg/dL   GFR, Est Non African American 72 > OR = 60 mL/min/1.40m2   GFR, Est African American 84 > OR = 60 mL/min/1.86m2   BUN/Creatinine Ratio NOT APPLICABLE 6 - 22 (calc)   Sodium 142 135 - 146 mmol/L   Potassium 3.9 3.5 - 5.3 mmol/L   Chloride 105 98 - 110 mmol/L   CO2 27 20 - 32 mmol/L   Calcium 9.0 8.6 - 10.3 mg/dL   Total Protein 6.5 6.1 - 8.1 g/dL   Albumin 4.3 3.6 - 5.1 g/dL   Globulin 2.2 1.9 - 3.7 g/dL (calc)   AG Ratio 2.0 1.0 - 2.5 (calc)   Total Bilirubin 0.7 0.2 - 1.2 mg/dL    Alkaline phosphatase (APISO) 62 40 - 115 U/L   AST 19 10 - 35 U/L   ALT 21 9 - 46 U/L  CBC with Differential/Platelet  Result Value Ref Range   WBC 5.5 3.8 - 10.8 Thousand/uL   RBC 4.84 4.20 - 5.80 Million/uL   Hemoglobin 14.8 13.2 - 17.1 g/dL   HCT 42.7 38.5 - 50.0 %   MCV 88.2 80.0 - 100.0 fL   MCH 30.6 27.0 - 33.0 pg   MCHC 34.7 32.0 - 36.0 g/dL   RDW 12.9 11.0 - 15.0 %   Platelets 202 140 - 400 Thousand/uL   MPV 10.8 7.5 - 12.5 fL   Neutro Abs 3,564 1,500 - 7,800 cells/uL   Lymphs Abs 1,084 850 - 3,900 cells/uL   WBC mixed population 484 200 - 950 cells/uL   Eosinophils Absolute 330 15 - 500 cells/uL   Basophils Absolute 39 0 - 200 cells/uL   Neutrophils Relative % 64.8 %   Total Lymphocyte 19.7 %   Monocytes Relative 8.8 %   Eosinophils Relative 6.0 %   Basophils Relative 0.7 %  Lipid panel  Result Value Ref Range   Cholesterol 166 <200 mg/dL   HDL 43 >40 mg/dL   Triglycerides 102 <150 mg/dL   LDL Cholesterol (Calc) 103 (H) mg/dL (calc)   Total CHOL/HDL Ratio 3.9 <5.0 (calc)   Non-HDL Cholesterol (Calc) 123 <130 mg/dL (calc)  Hemoglobin A1c  Result Value Ref Range   Hgb A1c MFr Bld 6.0 (H) <5.7 % of total Hgb   Mean Plasma Glucose 126 (calc)   eAG (mmol/L) 7.0 (calc)  Hepatitis C antibody  Result Value Ref Range   Hepatitis C Ab NON-REACTIVE NON-REACTI   SIGNAL TO CUT-OFF 0.01 <1.00      Assessment & Plan:   Problem List Items Addressed This Visit    None    Visit Diagnoses    Acute rhinosinusitis    -  Primary   Relevant Medications   benzonatate (TESSALON) 100 MG capsule   ipratropium (ATROVENT) 0.06 % nasal spray   URI with cough and congestion       Relevant Medications   benzonatate (TESSALON) 100 MG capsule   ipratropium (ATROVENT) 0.06 % nasal spray      Consistent with acute rhinosinusitis, likely initially viral URI with similar sick contact at home, without evidence or current concern for bacterial infection.  - Not consistent with  influenza, no testing done today. No other focal sign of infection ears, throat, or lungs - although some mild rhonchi clear with cough not entirely consistent with bronchitis by history but could be progressing  Plan: 1.  Reassurance, likely self-limited - no indication for antibiotics at this time - Start Atrovent nasal spray decongestant 2 sprays in each nostril up to 4 times daily for 7 days - Start [employer] take 1 capsule up to 3 times a day as needed for cough - Start OTC Mucinex regular, given samples / coupon - BID x 5-7 days - Supportive care with nasal saline OTC, hydration Return criteria reviewed if not improved - can call office 48-72 hours if persistent symptoms, review may consider antibiotic for sinus vs bronchitis if changed   Meds ordered this encounter  Medications  . benzonatate (TESSALON) 100 MG capsule    Sig: Take 1 capsule (100 mg total) by mouth 3 (three) times daily as needed for cough.    Dispense:  30 capsule    Refill:  0  . ipratropium (ATROVENT) 0.06 % nasal spray    Sig: Place 2 sprays into both nostrils 4 (four) times daily. For up to 5-7 days then stop.    Dispense:  15 mL    Refill:  0    Follow up plan: Return if symptoms worsen or fail to improve, for sinus.  [name], [facility] 05/06/2017, 1:48 PM

## 2017-05-13 ENCOUNTER — Ambulatory Visit (INDEPENDENT_AMBULATORY_CARE_PROVIDER_SITE_OTHER): Payer: Medicare HMO | Admitting: Family Medicine

## 2017-05-13 ENCOUNTER — Encounter: Payer: Self-pay | Admitting: Family Medicine

## 2017-05-13 VITALS — BP 141/78 | HR 75 | Temp 98.5°F | Resp 16 | Ht 69.0 in | Wt 251.6 lb

## 2017-05-13 DIAGNOSIS — J0110 Acute frontal sinusitis, unspecified: Secondary | ICD-10-CM | POA: Diagnosis not present

## 2017-05-13 MED ORDER — AZITHROMYCIN 250 MG PO TABS: ORAL_TABLET | ORAL | 0 refills | Status: DC

## 2017-05-13 NOTE — Progress Notes (Signed)
Subjective:    Patient ID: Eric Stone., male    DOB: 1946/02/24, 72 y.o.   MRN: [id number]  Eric Stone. is a 72 y.o. male presenting on 05/13/2017 for Sore Throat (same Sx like last visit)   HPI   ACUTE URI COUGH / CONGESTION / RHINOSINUSITIS - Last visit with me 05/06/17, for initial visit for same problem with URI sore throat, treated for rhinosinusitis with atrovent nasal and tessalon perls for cough, see prior notes for background information. - Interval update with limited improvement and without worsening, he finished cough and decongestant medicine, tried mucinex x 1 dose only limited relief - Today patient reports symptoms are still present. No new concerns. - Still has worse sinus pressure and congestion in morning, gradually improves. Some coughing in AM - Admits sore throat worse in AM - Denies any fevers/chills, sweats, body aches, abdominal pain, nausea vomiting, diarrhea, ear pain or pressure  Depression screen [facility] 2/9 01/02/2017 12/11/2016 02/09/2016  Decreased Interest 0 0 0  Down, Depressed, Hopeless 0 0 0  PHQ - 2 Score 0 0 0    Social History   Tobacco Use  . Smoking status: Former Smoker    Packs/day: 1.00    Years: 26.00    Pack years: 26.00    Types: Cigarettes    Last attempt to quit: 03/26/1989    Years since quitting: 28.1  . Smokeless tobacco: Former Systems developer  . Tobacco comment: less than 1 PPD  Substance Use Topics  . Alcohol use: No    Alcohol/week: 0.0 oz  . Drug use: No    Review of Systems Per HPI unless specifically indicated above     Objective:    BP (!) 141/78   Pulse 75   Temp 98.5 F (36.9 C) (Oral)   Resp 16   Ht 5\' 9"  (1.753 m)   Wt 251 lb 9.6 oz (114.1 kg)   SpO2 96%   BMI 37.15 kg/m   Wt Readings from Last 3 Encounters:  05/13/17 251 lb 9.6 oz (114.1 kg)  05/06/17 252 lb (114.3 kg)  01/02/17 246 lb 9.6 oz (111.9 kg)    Physical Exam  Constitutional: He is oriented to person, place, and time. He appears  well-developed and well-nourished. No distress.  Well-appearing, comfortable, cooperative  HENT:  Head: Normocephalic and atraumatic.  Mouth/Throat: Oropharynx is clear and moist.  Frontal / maxillary sinuses non-tender but feels full subjectively in this area. Nares with still mild turbinate edema with congestion without purulence. Bilateral TMs clear without erythema, effusion or bulging. Oropharynx clear without erythema, exudates, edema or asymmetry.  Eyes: Conjunctivae are normal. Right eye exhibits no discharge. Left eye exhibits no discharge.  Neck: Normal range of motion. Neck supple. No thyromegaly present.  Cardiovascular: Normal rate, regular rhythm, normal heart sounds and intact distal pulses.  No murmur heard. Pulmonary/Chest: Effort normal. No respiratory distress. He has no wheezes. He has no rales.  Mild lower left rhonchi clear with cough, otherwise good air movement. No coughing spells  Musculoskeletal: Normal range of motion. He exhibits no edema.  Lymphadenopathy:    He has no cervical adenopathy.  Neurological: He is alert and oriented to person, place, and time.  Skin: Skin is warm and dry. No rash noted. He is not diaphoretic. No erythema.  Psychiatric: His behavior is normal.  Nursing note and vitals reviewed.  Results for orders placed or performed in visit on 12/26/16  COMPLETE METABOLIC PANEL WITH GFR  Result Value Ref Range   Glucose, Bld 108 (H) 65 - 99 mg/dL   BUN 18 7 - 25 mg/dL   Creat 1.05 0.70 - 1.25 mg/dL   GFR, Est Non African American 72 > OR = 60 mL/min/1.20m2   GFR, Est African American 84 > OR = 60 mL/min/1.60m2   BUN/Creatinine Ratio NOT APPLICABLE 6 - 22 (calc)   Sodium 142 135 - 146 mmol/L   Potassium 3.9 3.5 - 5.3 mmol/L   Chloride 105 98 - 110 mmol/L   CO2 27 20 - 32 mmol/L   Calcium 9.0 8.6 - 10.3 mg/dL   Total Protein 6.5 6.1 - 8.1 g/dL   Albumin 4.3 3.6 - 5.1 g/dL   Globulin 2.2 1.9 - 3.7 g/dL (calc)   AG Ratio 2.0 1.0 - 2.5 (calc)    Total Bilirubin 0.7 0.2 - 1.2 mg/dL   Alkaline phosphatase (APISO) 62 40 - 115 U/L   AST 19 10 - 35 U/L   ALT 21 9 - 46 U/L  CBC with Differential/Platelet  Result Value Ref Range   WBC 5.5 3.8 - 10.8 Thousand/uL   RBC 4.84 4.20 - 5.80 Million/uL   Hemoglobin 14.8 13.2 - 17.1 g/dL   HCT 42.7 38.5 - 50.0 %   MCV 88.2 80.0 - 100.0 fL   MCH 30.6 27.0 - 33.0 pg   MCHC 34.7 32.0 - 36.0 g/dL   RDW 12.9 11.0 - 15.0 %   Platelets 202 140 - 400 Thousand/uL   MPV 10.8 7.5 - 12.5 fL   Neutro Abs 3,564 1,500 - 7,800 cells/uL   Lymphs Abs 1,084 850 - 3,900 cells/uL   WBC mixed population 484 200 - 950 cells/uL   Eosinophils Absolute 330 15 - 500 cells/uL   Basophils Absolute 39 0 - 200 cells/uL   Neutrophils Relative % 64.8 %   Total Lymphocyte 19.7 %   Monocytes Relative 8.8 %   Eosinophils Relative 6.0 %   Basophils Relative 0.7 %  Lipid panel  Result Value Ref Range   Cholesterol 166 <200 mg/dL   HDL 43 >40 mg/dL   Triglycerides 102 <150 mg/dL   LDL Cholesterol (Calc) 103 (H) mg/dL (calc)   Total CHOL/HDL Ratio 3.9 <5.0 (calc)   Non-HDL Cholesterol (Calc) 123 <130 mg/dL (calc)  Hemoglobin A1c  Result Value Ref Range   Hgb A1c MFr Bld 6.0 (H) <5.7 % of total Hgb   Mean Plasma Glucose 126 (calc)   eAG (mmol/L) 7.0 (calc)  Hepatitis C antibody  Result Value Ref Range   Hepatitis C Ab NON-REACTIVE NON-REACTI   SIGNAL TO CUT-OFF 0.01 <1.00      Assessment & Plan:   Problem List Items Addressed This Visit    None    Visit Diagnoses    Acute non-recurrent frontal sinusitis    -  Primary   Relevant Medications   azithromycin (ZITHROMAX Z-PAK) 250 MG tablet      Consistent with persistent acute sinusitis symptoms now >1 week, concern lingering symptoms may represent sinus infection vs other causes can still be viral vs allergic, since not worsening, w/o documented fever. - No evidence of AOM or pneumonia, lungs clear, vitals stable - Reassurance  Plan: 1. Discussion on  potential treatment options - agree to try empiric coverage Azithromycin antibiotic x 5 days - since PCN allergy - Start OTC Mucinex-D x 1 week - Start Loratadine (Claritin) 10mg  daily x 4 weeks - for likely allergy component - STOP Tessalon, Atrovent -  If not improved by 1-2 weeks can call office or start OTC would rx - Flonase 2 sprays in each nostril daily for next 4-6 weeks, then may stop and use seasonally or as needed - Return criteria only if worsening, otherwise likely needs to run course viral/allergic   Meds ordered this encounter  Medications  . azithromycin (ZITHROMAX Z-PAK) 250 MG tablet    Sig: Take 2 tabs (500mg  total) on Day 1. Take 1 tab (250mg ) daily for next 4 days.    Dispense:  6 tablet    Refill:  0      Follow up plan: Return if symptoms worsen or fail to improve, for sinusitis.  [name], [facility] 05/13/2017, 2:07 PM

## 2017-05-13 NOTE — Patient Instructions (Addendum)
Thank you for coming to the office today.  1.  Uncertain why it is not improving, could have lingering symptoms or could have developed more sinus infection  Start with Azithromycin Z-pak antibiotic for 5 days  Start Mucinex decongestant D twice daily for 1 week then stop  Start Loratadine (Claritin) 10mg  OTC daily for next 4 weeks for possible allergy  If feeling better but still some drainage in sinus or throat - we will switch to Flonase nasal spray - call office 1-2 weeks if still need this and we can send rx in.  If antibiotic does not resolve it then likely more Viral or Allergy will need to take time  Please schedule a Follow-up Appointment to: Return if symptoms worsen or fail to improve, for sinusitis.    If you have any other questions or concerns, please feel free to call the office or send a message through [place]. You may also schedule an earlier appointment if necessary.  Additionally, you may be receiving a survey about your experience at our office within a few days to 1 week by e-mail or mail. We value your feedback.  [name], DO [place]

## 2017-07-01 DIAGNOSIS — D229 Melanocytic nevi, unspecified: Secondary | ICD-10-CM | POA: Diagnosis not present

## 2017-07-01 DIAGNOSIS — D485 Neoplasm of uncertain behavior of skin: Secondary | ICD-10-CM | POA: Diagnosis not present

## 2017-07-01 DIAGNOSIS — Z85820 Personal history of malignant melanoma of skin: Secondary | ICD-10-CM | POA: Diagnosis not present

## 2017-07-01 DIAGNOSIS — I8393 Asymptomatic varicose veins of bilateral lower extremities: Secondary | ICD-10-CM | POA: Diagnosis not present

## 2017-07-01 DIAGNOSIS — D1801 Hemangioma of skin and subcutaneous tissue: Secondary | ICD-10-CM | POA: Diagnosis not present

## 2017-07-01 DIAGNOSIS — L918 Other hypertrophic disorders of the skin: Secondary | ICD-10-CM | POA: Diagnosis not present

## 2017-07-01 DIAGNOSIS — Z1283 Encounter for screening for malignant neoplasm of skin: Secondary | ICD-10-CM | POA: Diagnosis not present

## 2017-07-01 DIAGNOSIS — L821 Other seborrheic keratosis: Secondary | ICD-10-CM | POA: Diagnosis not present

## 2017-07-01 DIAGNOSIS — Z85828 Personal history of other malignant neoplasm of skin: Secondary | ICD-10-CM | POA: Diagnosis not present

## 2017-08-01 ENCOUNTER — Inpatient Hospital Stay: Payer: Medicare HMO | Attending: Radiation Oncology

## 2017-08-01 DIAGNOSIS — Z5111 Encounter for antineoplastic chemotherapy: Secondary | ICD-10-CM | POA: Insufficient documentation

## 2017-08-01 DIAGNOSIS — C61 Malignant neoplasm of prostate: Secondary | ICD-10-CM | POA: Diagnosis present

## 2017-08-01 LAB — PSA: PROSTATIC SPECIFIC ANTIGEN: 2.68 ng/mL (ref 0.00–4.00)

## 2017-08-05 DIAGNOSIS — G4733 Obstructive sleep apnea (adult) (pediatric): Secondary | ICD-10-CM | POA: Diagnosis not present

## 2017-08-08 ENCOUNTER — Other Ambulatory Visit: Payer: Self-pay | Admitting: *Deleted

## 2017-08-08 ENCOUNTER — Other Ambulatory Visit: Payer: Self-pay

## 2017-08-08 ENCOUNTER — Ambulatory Visit
Admission: RE | Admit: 2017-08-08 | Discharge: 2017-08-08 | Disposition: A | Discharge status: Home / Self Care | Payer: Medicare HMO | Source: Ambulatory Visit | Attending: Radiation Oncology | Admitting: Radiation Oncology

## 2017-08-08 ENCOUNTER — Encounter: Payer: Self-pay | Admitting: Radiation Oncology

## 2017-08-08 VITALS — BP 148/93 | HR 69 | Temp 97.2°F | Resp 20 | Wt 250.3 lb

## 2017-08-08 DIAGNOSIS — Z923 Personal history of irradiation: Secondary | ICD-10-CM | POA: Diagnosis not present

## 2017-08-08 DIAGNOSIS — C61 Malignant neoplasm of prostate: Secondary | ICD-10-CM

## 2017-08-08 NOTE — Progress Notes (Signed)
Radiation Oncology Follow up Note  Name: Eric Stone.   Date:   08/08/2017 MRN:  [id number] DOB: 10-31-1945    This 72 y.o. 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  REFERRING PROVIDER: Arlis Porta., MD  HPI: patient is a 72 year old male now out 3.5 years having completed IM RT radiation therapy for a Gleason 6 adenocarcinoma the prostate presenting the PSA of 4.8.His PSAs have jumped around a bit although recently.had jumped to 2.6 from 0.981 year prior. He continues to have no increased lower urinary tract symptoms no diarrhea and no bone pain.  COMPLICATIONS OF TREATMENT: none  FOLLOW UP COMPLIANCE: keeps appointments   PHYSICAL EXAM:  BP (!) 148/93   Pulse 69   Temp (!) 97.2 F (36.2 C)   Resp 20   Wt 250 lb 5.3 oz (113.6 kg)   BMI 36.97 kg/m  Well-developed well-nourished patient in NAD. HEENT reveals PERLA, EOMI, discs not visualized.  Oral cavity is clear. No oral mucosal lesions are identified. Neck is clear without evidence of cervical or supraclavicular adenopathy. Lungs are clear to A&P. Cardiac examination is essentially unremarkable with regular rate and rhythm without murmur rub or thrill. Abdomen is benign with no organomegaly or masses noted. Motor sensory and DTR levels are equal and symmetric in the upper and lower extremities. Cranial nerves II through XII are grossly intact. Proprioception is intact. No peripheral adenopathy or edema is identified. No motor or sensory levels are noted. Crude visual fields are within normal range.  RADIOLOGY RESULTS: no current films for review  PLAN: at this point with a significant jump in his PSA I've ordered ACE 4 month Lupron injection. I've discussed the risks and benefits of a DVT therapy with the patient and he comprehends well. I've also asked to see him back in 6 months with a repeated his PSA at that time. Should there be significant  change again in his PSA may refer him to medical oncology for initial consultation. Patient Plans our treatment plan well.  I would like to take this opportunity to thank you for allowing me to participate in the care of your patient.Noreene Filbert, MD

## 2017-08-12 ENCOUNTER — Other Ambulatory Visit: Payer: Self-pay | Admitting: *Deleted

## 2017-08-12 DIAGNOSIS — C61 Malignant neoplasm of prostate: Secondary | ICD-10-CM

## 2017-08-13 ENCOUNTER — Inpatient Hospital Stay: Payer: Medicare HMO

## 2017-08-13 DIAGNOSIS — Z5111 Encounter for antineoplastic chemotherapy: Secondary | ICD-10-CM | POA: Diagnosis not present

## 2017-08-13 DIAGNOSIS — C61 Malignant neoplasm of prostate: Secondary | ICD-10-CM

## 2017-08-13 MED ORDER — LEUPROLIDE ACETATE (4 MONTH) 30 MG IM KIT
30.0000 mg | PACK | Freq: Once | INTRAMUSCULAR | Status: AC
  Administered 2017-08-13: 30 mg via INTRAMUSCULAR

## 2017-10-15 DIAGNOSIS — C4372 Malignant melanoma of left lower limb, including hip: Secondary | ICD-10-CM | POA: Diagnosis not present

## 2017-10-21 ENCOUNTER — Encounter: Payer: Self-pay | Admitting: Family Medicine

## 2017-10-21 ENCOUNTER — Ambulatory Visit (INDEPENDENT_AMBULATORY_CARE_PROVIDER_SITE_OTHER): Payer: Medicare HMO | Admitting: Family Medicine

## 2017-10-21 VITALS — BP 151/79 | HR 71 | Temp 98.4°F | Resp 16 | Ht 69.0 in | Wt 250.6 lb

## 2017-10-21 DIAGNOSIS — I890 Lymphedema, not elsewhere classified: Secondary | ICD-10-CM

## 2017-10-21 DIAGNOSIS — R600 Localized edema: Secondary | ICD-10-CM

## 2017-10-21 MED ORDER — FUROSEMIDE 20 MG PO TABS: 20.0000 mg | ORAL_TABLET | Freq: Every day | ORAL | 0 refills | Status: DC | PRN

## 2017-10-21 NOTE — Patient Instructions (Addendum)
Thank you for coming to the office today.  Recommend contacting back [facility] Surgical Oncology doctors - and follow-up with their recommendation for a referral to [facility] Vascular Surgery for Left Lower extremity Lymphedema  They may have called you already and you can call them back - I would prefer you to stay with [facility] instead of new referral local  Use RICE therapy: - R - Rest / relative rest with activity modification avoid overuse of joint - I - Ice packs (make sure you use a towel or sock / something to protect skin) - C - Compression with ACE wrap to apply pressure and reduce swelling allowing more support - E - Elevation - if significant swelling, lift leg above heart level (toes above your nose) to help reduce swelling, most helpful at night after day of being on your feet  Does not look infected at this time.   Due to swelling we can try Lasix (Furosemide) 20mg  fluid pill - this is as needed only - may only use few days in a row, goal to avoid long term use - can ask Vascular Surgery doctor about this med.  Stay well hydrated on med.  Please schedule a Follow-up Appointment to: Return in about 2 weeks (around 11/04/2017), or if symptoms worsen or fail to improve, for left leg/foot swelling lymphedema.  If you have any other questions or concerns, please feel free to call the office or send a message through [place]. You may also schedule an earlier appointment if necessary.  Additionally, you may be receiving a survey about your experience at our office within a few days to 1 week by e-mail or mail. We value your feedback.  [name], DO [place]

## 2017-10-21 NOTE — Progress Notes (Signed)
Subjective:    Patient ID: Eric Stone., male    DOB: 21-Feb-1946, 72 y.o.   MRN: [id number]  Eric Stone. is a 72 y.o. male presenting on 10/21/2017 for Foot Pain (left) and Edema (Left foot/ankle)   HPI   Lymphedema, Left Foot Reports symptoms of left lower extremity swelling for past few weeks to month. This is aecent concern previously he was not having significant issue with swelling, despite known varicose veins and venous insufficiency. Last visit with [facility] Surgical Oncology 10/15/17, surgeon who performed removal of L great toe. History of melanoma Stage IIA, s/p wide local excision of left great toe and sentinel lymph node biopsy of L eft groin 02/2016. At last visit 7/23 they diagnosed him with lymphedema and recommended referral to [facility] Vascular Surgery, this has not been arranged yet, he has tried elevating leg some, but not consistently, not on diuretic, not using compression therapy. - Admits to some localized discomfort in foot - Denies fevers chills redness other area of swelling  Depression screen [facility] 2/9 10/21/2017 08/08/2017 01/02/2017  Decreased Interest 0 0 0  Down, Depressed, Hopeless 0 0 0  PHQ - 2 Score 0 0 0    Social History   Tobacco Use  . Smoking status: Former Smoker    Packs/day: 1.00    Years: 26.00    Pack years: 26.00    Types: Cigarettes    Last attempt to quit: 03/26/1989    Years since quitting: 28.5  . Smokeless tobacco: Former Systems developer  . Tobacco comment: less than 1 PPD  Substance Use Topics  . Alcohol use: No    Alcohol/week: 0.0 oz  . Drug use: No    Review of Systems Per HPI unless specifically indicated above     Objective:    BP (!) 151/79   Pulse 71   Temp 98.4 F (36.9 C) (Oral)   Resp 16   Ht 5\' 9"  (1.753 m)   Wt 250 lb 9.6 oz (113.7 kg)   BMI 37.01 kg/m   Wt Readings from Last 3 Encounters:  10/21/17 250 lb 9.6 oz (113.7 kg)  08/08/17 250 lb 5.3 oz (113.6 kg)  05/13/17 251 lb 9.6 oz (114.1 kg)    Physical Exam    Constitutional: He is oriented to person, place, and time. He appears well-developed and well-nourished. No distress.  Well-appearing, comfortable, cooperative  HENT:  Head: Normocephalic and atraumatic.  Mouth/Throat: Oropharynx is clear and moist.  Eyes: Conjunctivae are normal. Right eye exhibits no discharge. Left eye exhibits no discharge.  Cardiovascular: Normal rate, regular rhythm, normal heart sounds and intact distal pulses.  No murmur heard. Pulmonary/Chest: Effort normal and breath sounds normal. No respiratory distress. He has no wheezes. He has no rales.  Musculoskeletal: Normal range of motion. He exhibits edema (bilateral lower extremity edema - with left lower ext / ankle and foot significant edema inc compared to right - both pitting edema).  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  Skin: Skin is warm and dry. No rash noted. He is not diaphoretic. No erythema.  Bilateral LE with L>R varicose veins  Psychiatric: He has a normal mood and affect. His behavior is normal.  Well groomed, good eye contact, normal speech and thoughts  Nursing note and vitals reviewed.  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 - Uncertain recent trigger for worse swelling, possibly with warm weather, otherwise no obvious  provoking factor. No significant erythema or tenderness within calf or rest of leg to suggest DVT - considered this etiology  Plan Advised that I agree with [facility] with recommendation to refer to Vascular Surgery - advised him to call and check status of this referral - prefer to not refer locally to [facility] since he is already established with specialty care at [facility] for this issue - he may need advanced compression stockings or future lymphedema pump - would warrant further imaging work up - For now temporary measures - RICE therapy and also rx temporary Lasix (furosemide) 20mg  daily PRN 3-5 days at a time, advised precautions  Meds ordered this encounter  Medications  . furosemide (LASIX) 20 MG tablet    Sig: Take 1 tablet (20 mg total) by mouth daily as needed for edema. For 3-5 days at a time as needed only    Dispense:  20 tablet    Refill:  0    Follow up plan: Return in about 2 weeks (around 11/04/2017), or if symptoms worsen or fail to improve, for left leg/foot swelling lymphedema.  [name], [facility] 10/21/2017, 10:51 PM

## 2017-11-18 DIAGNOSIS — I890 Lymphedema, not elsewhere classified: Secondary | ICD-10-CM | POA: Diagnosis not present

## 2017-11-26 ENCOUNTER — Telehealth: Payer: Self-pay | Admitting: Family Medicine

## 2017-11-26 NOTE — Telephone Encounter (Signed)
Called to schedule Medicare Annual Wellness Visit with the Nurse Health Advisor.   If patient returns call, please note: their last AWV was on 12/11/16 please schedule AWV with NHA any date after 9/18 2019  Thank you! For any questions please contact: Jill Alexanders [phone] or Skype at: [email or web address]

## 2017-11-27 NOTE — Telephone Encounter (Signed)
Could he stay after appt for labs on 10/15. Left msg to c/b knb

## 2018-01-01 DIAGNOSIS — L578 Other skin changes due to chronic exposure to nonionizing radiation: Secondary | ICD-10-CM | POA: Diagnosis not present

## 2018-01-01 DIAGNOSIS — Z85828 Personal history of other malignant neoplasm of skin: Secondary | ICD-10-CM | POA: Diagnosis not present

## 2018-01-01 DIAGNOSIS — D485 Neoplasm of uncertain behavior of skin: Secondary | ICD-10-CM | POA: Diagnosis not present

## 2018-01-01 DIAGNOSIS — L570 Actinic keratosis: Secondary | ICD-10-CM | POA: Diagnosis not present

## 2018-01-01 DIAGNOSIS — I8393 Asymptomatic varicose veins of bilateral lower extremities: Secondary | ICD-10-CM | POA: Diagnosis not present

## 2018-01-01 DIAGNOSIS — L299 Pruritus, unspecified: Secondary | ICD-10-CM | POA: Diagnosis not present

## 2018-01-01 DIAGNOSIS — Z1283 Encounter for screening for malignant neoplasm of skin: Secondary | ICD-10-CM | POA: Diagnosis not present

## 2018-01-01 DIAGNOSIS — D225 Melanocytic nevi of trunk: Secondary | ICD-10-CM | POA: Diagnosis not present

## 2018-01-01 DIAGNOSIS — Z85820 Personal history of malignant melanoma of skin: Secondary | ICD-10-CM | POA: Diagnosis not present

## 2018-01-01 DIAGNOSIS — L290 Pruritus ani: Secondary | ICD-10-CM | POA: Diagnosis not present

## 2018-01-07 ENCOUNTER — Other Ambulatory Visit: Payer: Medicare HMO

## 2018-01-07 ENCOUNTER — Ambulatory Visit (INDEPENDENT_AMBULATORY_CARE_PROVIDER_SITE_OTHER): Payer: Medicare HMO

## 2018-01-07 VITALS — BP 142/86 | HR 60 | Temp 97.9°F | Resp 16 | Ht 69.0 in | Wt 249.8 lb

## 2018-01-07 DIAGNOSIS — Z85820 Personal history of malignant melanoma of skin: Secondary | ICD-10-CM

## 2018-01-07 DIAGNOSIS — R7303 Prediabetes: Secondary | ICD-10-CM

## 2018-01-07 DIAGNOSIS — I10 Essential (primary) hypertension: Secondary | ICD-10-CM

## 2018-01-07 DIAGNOSIS — Z23 Encounter for immunization: Secondary | ICD-10-CM | POA: Diagnosis not present

## 2018-01-07 DIAGNOSIS — E7800 Pure hypercholesterolemia, unspecified: Secondary | ICD-10-CM | POA: Diagnosis not present

## 2018-01-07 DIAGNOSIS — Z0000 Encounter for general adult medical examination without abnormal findings: Secondary | ICD-10-CM

## 2018-01-07 DIAGNOSIS — Z8546 Personal history of malignant neoplasm of prostate: Secondary | ICD-10-CM

## 2018-01-07 NOTE — Patient Instructions (Addendum)
[name] , Thank you for taking time to come for your Medicare Wellness Visit. I appreciate your ongoing commitment to your health goals. Please review the following plan we discussed and let me know if I can assist you in the future.   Screening recommendations/referrals: Colonoscopy: completed 12/24/2012 Recommended yearly ophthalmology/optometry visit for glaucoma screening and checkup Recommended yearly dental visit for hygiene and checkup  Vaccinations: Influenza vaccine: done today  Pneumococcal vaccine: completed series Tdap vaccine: up to date Shingles vaccine: shingrix series completed    Advanced directives: Please bring a copy of your health care power of attorney and living will to the office at your convenience.  Conditions/risks identified: Recommend drinking at least 6-8 glasses of water a day   Next appointment: Follow up on 01/14/2018 at 10:00am with Dr.Karamalegos. Follow up in one year for your annual wellness exam.  Preventive Care 65 Years and Older, Male Preventive care refers to lifestyle choices and visits with your health care provider that can promote health and wellness. What does preventive care include?  A yearly physical exam. This is also called an annual well check.  Dental exams once or twice a year.  Routine eye exams. Ask your health care provider how often you should have your eyes checked.  Personal lifestyle choices, including:  Daily care of your teeth and gums.  Regular physical activity.  Eating a healthy diet.  Avoiding tobacco and drug use.  Limiting alcohol use.  Practicing safe sex.  Taking low doses of aspirin every day.  Taking vitamin and mineral supplements as recommended by your health care provider. What happens during an annual well check? The services and screenings done by your health care provider during your annual well check will depend on your age, overall health, lifestyle risk factors, and family history of  disease. Counseling  Your health care provider may ask you questions about your:  Alcohol use.  Tobacco use.  Drug use.  Emotional well-being.  Home and relationship well-being.  Sexual activity.  Eating habits.  History of falls.  Memory and ability to understand (cognition).  Work and work Statistician. Screening  You may have the following tests or measurements:  Height, weight, and BMI.  Blood pressure.  Lipid and cholesterol levels. These may be checked every 5 years, or more frequently if you are over 59 years old.  Skin check.  Lung cancer screening. You may have this screening every year starting at age 34 if you have a 30-pack-year history of smoking and currently smoke or have quit within the past 15 years.  Fecal occult blood test (FOBT) of the stool. You may have this test every year starting at age 42.  Flexible sigmoidoscopy or colonoscopy. You may have a sigmoidoscopy every 5 years or a colonoscopy every 10 years starting at age 43.  Prostate cancer screening. Recommendations will vary depending on your family history and other risks.  Hepatitis C blood test.  Hepatitis B blood test.  Sexually transmitted disease (STD) testing.  Diabetes screening. This is done by checking your blood sugar (glucose) after you have not eaten for a while (fasting). You may have this done every 1-3 years.  Abdominal aortic aneurysm (AAA) screening. You may need this if you are a current or former smoker.  Osteoporosis. You may be screened starting at age 64 if you are at high risk. Talk with your health care provider about your test results, treatment options, and if necessary, the need for more tests. Vaccines  Your health care provider may recommend certain vaccines, such as:  Influenza vaccine. This is recommended every year.  Tetanus, diphtheria, and acellular pertussis (Tdap, Td) vaccine. You may need a Td booster every 10 years.  Zoster vaccine. You may  need this after age 54.  Pneumococcal 13-valent conjugate (PCV13) vaccine. One dose is recommended after age 45.  Pneumococcal polysaccharide (PPSV23) vaccine. One dose is recommended after age 101. Talk to your health care provider about which screenings and vaccines you need and how often you need them. This information is not intended to replace advice given to you by your health care provider. Make sure you discuss any questions you have with your health care provider. Document Released: 04/08/2015 Document Revised: 11/30/2015 Document Reviewed: 01/11/2015 Elsevier Interactive Patient Education  2017 [place] Prevention in the Home Falls can cause injuries. They can happen to people of all ages. There are many things you can do to make your home safe and to help prevent falls. What can I do on the outside of my home?  Regularly fix the edges of walkways and driveways and fix any cracks.  Remove anything that might make you trip as you walk through a door, such as a raised step or threshold.  Trim any bushes or trees on the path to your home.  Use bright outdoor lighting.  Clear any walking paths of anything that might make someone trip, such as rocks or tools.  Regularly check to see if handrails are loose or broken. Make sure that both sides of any steps have handrails.  Any raised decks and porches should have guardrails on the edges.  Have any leaves, snow, or ice cleared regularly.  Use sand or salt on walking paths during winter.  Clean up any spills in your garage right away. This includes oil or grease spills. What can I do in the bathroom?  Use night lights.  Install grab bars by the toilet and in the tub and shower. Do not use towel bars as grab bars.  Use non-skid mats or decals in the tub or shower.  If you need to sit down in the shower, use a plastic, non-slip stool.  Keep the floor dry. Clean up any water that spills on the floor as soon as it  happens.  Remove soap buildup in the tub or shower regularly.  Attach bath mats securely with double-sided non-slip rug tape.  Do not have throw rugs and other things on the floor that can make you trip. What can I do in the bedroom?  Use night lights.  Make sure that you have a light by your bed that is easy to reach.  Do not use any sheets or blankets that are too big for your bed. They should not hang down onto the floor.  Have a firm chair that has side arms. You can use this for support while you get dressed.  Do not have throw rugs and other things on the floor that can make you trip. What can I do in the kitchen?  Clean up any spills right away.  Avoid walking on wet floors.  Keep items that you use a lot in easy-to-reach places.  If you need to reach something above you, use a strong step stool that has a grab bar.  Keep electrical cords out of the way.  Do not use floor polish or wax that makes floors slippery. If you must use wax, use non-skid floor wax.  Do  not have throw rugs and other things on the floor that can make you trip. What can I do with my stairs?  Do not leave any items on the stairs.  Make sure that there are handrails on both sides of the stairs and use them. Fix handrails that are broken or loose. Make sure that handrails are as long as the stairways.  Check any carpeting to make sure that it is firmly attached to the stairs. Fix any carpet that is loose or worn.  Avoid having throw rugs at the top or bottom of the stairs. If you do have throw rugs, attach them to the floor with carpet tape.  Make sure that you have a light switch at the top of the stairs and the bottom of the stairs. If you do not have them, ask someone to add them for you. What else can I do to help prevent falls?  Wear shoes that:  Do not have high heels.  Have rubber bottoms.  Are comfortable and fit you well.  Are closed at the toe. Do not wear sandals.  If you  use a stepladder:  Make sure that it is fully opened. Do not climb a closed stepladder.  Make sure that both sides of the stepladder are locked into place.  Ask someone to hold it for you, if possible.  Clearly mark and make sure that you can see:  Any grab bars or handrails.  First and last steps.  Where the edge of each step is.  Use tools that help you move around (mobility aids) if they are needed. These include:  Canes.  Walkers.  Scooters.  Crutches.  Turn on the lights when you go into a dark area. Replace any light bulbs as soon as they burn out.  Set up your furniture so you have a clear path. Avoid moving your furniture around.  If any of your floors are uneven, fix them.  If there are any pets around you, be aware of where they are.  Review your medicines with your doctor. Some medicines can make you feel dizzy. This can increase your chance of falling. Ask your doctor what other things that you can do to help prevent falls. This information is not intended to replace advice given to you by your health care provider. Make sure you discuss any questions you have with your health care provider. Document Released: 01/06/2009 Document Revised: 08/18/2015 Document Reviewed: 04/16/2014 Elsevier Interactive Patient Education  2017 [employer].

## 2018-01-07 NOTE — Progress Notes (Signed)
Subjective:   Eric Stone. is a 72 y.o. male who presents for Medicare Annual/Subsequent preventive examination.  Review of Systems:  Cardiac Risk Factors include: advanced age (>72men, >65 women);dyslipidemia;hypertension;male gender;obesity (BMI >30kg/m2);smoking/ tobacco exposure     Objective:    Vitals: BP (!) 142/86 (BP Location: Right Arm, Patient Position: Sitting)   Pulse 60   Temp 97.9 F (36.6 C) (Oral)   Resp 16   Ht 5\' 9"  (1.753 m)   Wt 249 lb 12.8 oz (113.3 kg)   BMI 36.89 kg/m   Body mass index is 36.89 kg/m.  Advanced Directives 01/07/2018 08/08/2017 12/11/2016 08/09/2016 02/09/2016 07/11/2015 02/10/2015  Does Patient Have a Medical Advance Directive? Yes No Yes No No Yes No  Type of Advance Directive Living will;Healthcare Power of [place];Living will - - - -  Copy of [place] in Chart? No - copy requested - No - copy requested - - - -  Would patient like information on creating a medical advance directive? - No - Patient declined - No - Patient declined No - patient declined information - Yes - Educational materials given  Pre-existing out of facility DNR order (yellow form or pink MOST form) - - - - - - -    Tobacco Social History   Tobacco Use  Smoking Status Former Smoker  . Packs/day: 1.00  . Years: 26.00  . Pack years: 26.00  . Types: Cigarettes  . Last attempt to quit: 03/26/1989  . Years since quitting: 28.8  Smokeless Tobacco Former Systems developer  Tobacco Comment   less than 1 PPD     Counseling given: Not Answered Comment: less than 1 PPD   Clinical Intake:  Pre-visit preparation completed: Yes  Pain : No/denies pain     Nutritional Status: BMI > 30  Obese Nutritional Risks: None Diabetes: No  How often do you need to have someone help you when you read instructions, pamphlets, or other written materials from your doctor or pharmacy?: 1 - Never What is the last grade level you  completed in school?: tech school  Interpreter Needed?: No  Information entered by :: Tiffany Hill,LPN   Past Medical History:  Diagnosis Date  . Arthritis   . H/O blood clots   . H/O blood clots    in left leg in 1988  . Hypertension   . Rash    occasionally and pt does see a dermatologist  . Sleep apnea    uses a CPAP;sleep study >53yrs ago   Past Surgical History:  Procedure Laterality Date  . TOE SURGERY     melanoma  . 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 VIDEO ASSISTED THORACOSCOPY (VATS)/DECORTICATION Left 05/06/2012   Procedure: VIDEO ASSISTED THORACOSCOPY (VATS) drainage loculated pleural effusion;  Surgeon: Grace Isaac, MD;  Location: [facility];  Service: Thoracic;  Laterality: Left;  Marland Kitchen VIDEO BRONCHOSCOPY N/A 05/06/2012   Procedure: VIDEO BRONCHOSCOPY;  Surgeon: Grace Isaac, MD;  Location[facility];  Service: Thoracic;  Laterality: N/A;   Family History  Problem Relation Age of Onset  . Hypertension Mother   . Stroke Mother        possible   Social History   Socioeconomic History  . Marital status: Married    Spouse name: Not on file  . Number of children: Not on file  . Years of education: tech school  . Highest education level: High school graduate  Occupational  History  . Occupation: retired  Scientific laboratory technician  . Financial resource strain: Not hard at all  . Food insecurity:    Worry: Never true    Inability: Never true  . Transportation needs:    Medical: No    Non-medical: No  Tobacco Use  . Smoking status: Former Smoker    Packs/day: 1.00    Years: 26.00    Pack years: 26.00    Types: Cigarettes    Last attempt to quit: 03/26/1989    Years since quitting: 28.8  . Smokeless tobacco: Former Systems developer  . Tobacco comment: less than 1 PPD  Substance and Sexual Activity  . Alcohol use: No    Alcohol/week: 0.0 standard drinks  . Drug use: No  . Sexual activity: Yes  Lifestyle  . Physical activity:    Days per week: 0 days    Minutes  per session: 0 min  . Stress: Not at all  Relationships  . Social connections:    Talks on phone: More than three times a week    Gets together: More than three times a week    Attends religious service: More than 4 times per year    Active member of club or organization: No    Attends meetings of clubs or organizations: Never    Relationship status: Married  Other Topics Concern  . Not on file  Social History Narrative  . Not on file    Outpatient Encounter Medications as of 01/07/2018  Medication Sig  . aspirin EC 81 MG tablet Take 81 mg by mouth.  . Crisaborole 2 % OINT   . furosemide (LASIX) 20 MG tablet Take 1 tablet (20 mg total) by mouth daily as needed for edema. For 3-5 days at a time as needed only  . lisinopril (PRINIVIL,ZESTRIL) 30 MG tablet Take 1 tablet (30 mg total) by mouth daily. Take 1, 30 mg. Tab each day.  . rosuvastatin (CRESTOR) 10 MG tablet Take 1 tablet (10 mg total) by mouth at bedtime.  Marland Kitchen Zoster Vac Recomb Adjuvanted [employer]) injection Inject 0.5mg  mL vaccine IM once then repeat dose with refill within 6 months   No facility-administered encounter medications on file as of 01/07/2018.     Activities of Daily Living In your present state of health, do you have any difficulty performing the following activities: 01/07/2018  Hearing? N  Comment declines hearing aids  Vision? N  Comment Sees Dr.Cheek, reading glasses  Difficulty concentrating or making decisions? N  Walking or climbing stairs? N  Dressing or bathing? N  Doing errands, shopping? N  Preparing Food and eating ? N  Comment declines dentures   Using the Toilet? N  In the past six months, have you accidently leaked urine? N  Do you have problems with loss of bowel control? N  Managing your Medications? N  Managing your Finances? N  Housekeeping or managing your Housekeeping? N  Some recent data might be hidden    Patient Care Team: Olin Hauser, DO as PCP - General  (Family Medicine) Luan Pulling, Ronelle Nigh., MD (Unknown Physician Specialty) Grace Isaac, MD as Consulting Physician (Cardiothoracic Surgery) Clance, Armando Reichert, MD as Consulting Physician (Pulmonary Disease) Grace Isaac, MD as Consulting Physician (Cardiothoracic Surgery) Clance, Armando Reichert, MD as Consulting Physician (Pulmonary Disease)   Assessment:   This is a routine wellness examination for Marcella.  Exercise Activities and Dietary recommendations Current Exercise Habits: The patient does not participate in regular exercise at  present, Exercise limited by: None identified  Goals    . DIET - INCREASE WATER INTAKE     Recommend drinking at least 6-8 glasses of water a day     . Exercise 3x per week (30 min per time)     Will increase execise and eat more veggies.        Fall Risk Fall Risk  01/07/2018 10/21/2017 08/08/2017 01/02/2017 12/11/2016  Falls in the past year? No No No No No   FALL RISK PREVENTION PERTAINING TO THE HOME:  Any stairs in or around the home WITH handrails? Yes  Home free of loose throw rugs in walkways, pet beds, electrical cords, etc? Yes  Adequate lighting in your home to reduce risk of falls? Yes   ASSISTIVE DEVICES UTILIZED TO PREVENT FALLS:  Life alert? No  Use of a cane, walker or w/c? No  Grab bars in the bathroom? Yes  Shower chair or bench in shower? No  Elevated toilet seat or a handicapped toilet? No   DME ORDERS:  DME order needed?  No   TIMED UP AND GO:  Was the test performed? Yes .  Length of time to ambulate 10 feet: 8 sec.   GAIT:  Appearance of gait: Gait stead-fast without the use of an assistive device. Education: Fall risk prevention has been discussed.  Intervention(s) required? No    Depression Screen PHQ 2/9 Scores 01/07/2018 10/21/2017 08/08/2017 01/02/2017  PHQ - 2 Score 0 0 0 0    Cognitive Function MMSE - Mini Mental State Exam 12/31/2014  Orientation to time 5  Orientation to Place 5  Registration 3    Attention/ Calculation 5  Recall 3  Language- name 2 objects 2  Language- repeat 1  Language- follow 3 step command 3  Language- read & follow direction 1  Write a sentence 1  Copy design 1  Total score 30     6CIT Screen 01/07/2018 12/11/2016  What Year? 0 points 0 points  What month? 0 points 0 points  What time? 0 points 0 points  Count back from 20 0 points 0 points  Months in reverse 0 points 0 points  Repeat phrase 0 points 0 points  Total Score 0 0    Immunization History  Administered Date(s) Administered  . Influenza, High Dose Seasonal PF 12/31/2014, 12/11/2016, 01/07/2018  . Influenza-Unspecified 02/21/2011  . Pneumococcal Conjugate-13 01/07/2014  . Pneumococcal Polysaccharide-23 03/26/2012  . Tdap 06/07/2014  . Zoster 03/26/2010  . Zoster Recombinat (Shingrix) 08/12/2017, 10/31/2017    Qualifies for Shingles Vaccine? Yes  shingrix series completed   Tdap: completed 06/07/2014  Flu Vaccine: Due for Flu vaccine. Does the patient want to receive this vaccine today?  Yes .   Pneumococcal Vaccine: completed series   Screening Tests Health Maintenance  Topic Date Due  . INFLUENZA VACCINE  10/24/2017  . COLONOSCOPY  12/25/2022  . TETANUS/TDAP  06/06/2024  . Hepatitis C Screening  Completed  . PNA vac Low Risk Adult  Completed   Cancer Screenings:  Colorectal Screening: Completed 12/24/2012. Repeat every 10 years  Lung Cancer Screening: (Low Dose CT Chest recommended if Age 53-80 years, 30 pack-year currently smoking OR have quit w/in 15years.) does not qualify.   Additional Screening:  Hepatitis C Screening: does qualify; Completed 12/26/2016  Vision Screening: Recommended annual ophthalmology exams for early detection of glaucoma and other disorders of the eye. Is the patient up to date with their annual eye exam?  Yes  Who  is the provider or what is the name of the office in which the pt attends annual eye exams? Dr.Cheek   Dental Screening:  Recommended annual dental exams for proper oral hygiene  Community Resource Referral:  CRR required this visit?  No       Plan:    I have personally reviewed and addressed the Medicare Annual Wellness questionnaire and have noted the following in the patient's chart:  A. Medical and social history B. Use of alcohol, tobacco or illicit drugs  C. Current medications and supplements D. Functional ability and status E.  Nutritional status F.  Physical activity G. Advance directives H. List of other physicians I.  Hospitalizations, surgeries, and ER visits in previous 12 months J.  [place] such as hearing and vision if needed, cognitive and depression L. Referrals and appointments   In addition, I have reviewed and discussed with patient certain preventive protocols, quality metrics, and best practice recommendations. A written personalized care plan for preventive services as well as general preventive health recommendations were provided to patient.   Signed,  Tyler Aas, LPN Nurse Health Advisor   Nurse Notes:none

## 2018-01-08 LAB — COMPLETE METABOLIC PANEL WITH GFR
AG Ratio: 1.5 (calc) (ref 1.0–2.5)
ALBUMIN MSPROF: 4 g/dL (ref 3.6–5.1)
ALKALINE PHOSPHATASE (APISO): 60 U/L (ref 40–115)
ALT: 25 U/L (ref 9–46)
AST: 23 U/L (ref 10–35)
BILIRUBIN TOTAL: 0.6 mg/dL (ref 0.2–1.2)
BUN: 18 mg/dL (ref 7–25)
CO2: 29 mmol/L (ref 20–32)
CREATININE: 0.93 mg/dL (ref 0.70–1.18)
Calcium: 9.1 mg/dL (ref 8.6–10.3)
Chloride: 106 mmol/L (ref 98–110)
GFR, EST NON AFRICAN AMERICAN: 83 mL/min/{1.73_m2} (ref >=60)
GFR, Est African American: 96 mL/min/{1.73_m2} (ref >=60)
GLOBULIN: 2.6 g/dL (ref 1.9–3.7)
GLUCOSE: 118 mg/dL — AB (ref 65–99)
Potassium: 4.3 mmol/L (ref 3.5–5.3)
SODIUM: 141 mmol/L (ref 135–146)
TOTAL PROTEIN: 6.6 g/dL (ref 6.1–8.1)

## 2018-01-08 LAB — CBC WITH DIFFERENTIAL/PLATELET
BASOS PCT: 0.9 %
Basophils Absolute: 49 cells/uL (ref 0–200)
EOS PCT: 6.5 %
Eosinophils Absolute: 351 cells/uL (ref 15–500)
HEMATOCRIT: 39.4 % (ref 38.5–50.0)
Hemoglobin: 13.3 g/dL (ref 13.2–17.1)
LYMPHS ABS: 1269 {cells}/uL (ref 850–3900)
MCH: 29.4 pg (ref 27.0–33.0)
MCHC: 33.8 g/dL (ref 32.0–36.0)
MCV: 87.2 fL (ref 80.0–100.0)
MPV: 10.5 fL (ref 7.5–12.5)
Monocytes Relative: 10.4 %
NEUTROS ABS: 3170 {cells}/uL (ref 1500–7800)
NEUTROS PCT: 58.7 %
Platelets: 200 10*3/uL (ref 140–400)
RBC: 4.52 10*6/uL (ref 4.20–5.80)
RDW: 13.2 % (ref 11.0–15.0)
Total Lymphocyte: 23.5 %
WBC mixed population: 562 cells/uL (ref 200–950)
WBC: 5.4 10*3/uL (ref 3.8–10.8)

## 2018-01-08 LAB — HEMOGLOBIN A1C
HEMOGLOBIN A1C: 6.6 %{Hb} — AB (ref <5.7)
Mean Plasma Glucose: 143 (calc)
eAG (mmol/L): 7.9 (calc)

## 2018-01-08 LAB — LIPID PANEL
Cholesterol: 167 mg/dL (ref <200)
HDL: 49 mg/dL (ref >40)
LDL CHOLESTEROL (CALC): 99 mg/dL
Non-HDL Cholesterol (Calc): 118 mg/dL (calc) (ref <130)
TRIGLYCERIDES: 98 mg/dL (ref <150)
Total CHOL/HDL Ratio: 3.4 (calc) (ref <5.0)

## 2018-01-14 ENCOUNTER — Ambulatory Visit (INDEPENDENT_AMBULATORY_CARE_PROVIDER_SITE_OTHER): Payer: Medicare HMO | Admitting: Family Medicine

## 2018-01-14 ENCOUNTER — Encounter: Payer: Self-pay | Admitting: Family Medicine

## 2018-01-14 VITALS — BP 146/75 | HR 65 | Temp 98.6°F | Resp 16 | Ht 69.0 in | Wt 249.0 lb

## 2018-01-14 DIAGNOSIS — R7303 Prediabetes: Secondary | ICD-10-CM | POA: Diagnosis not present

## 2018-01-14 DIAGNOSIS — E7800 Pure hypercholesterolemia, unspecified: Secondary | ICD-10-CM | POA: Diagnosis not present

## 2018-01-14 DIAGNOSIS — R972 Elevated prostate specific antigen [PSA]: Secondary | ICD-10-CM | POA: Diagnosis not present

## 2018-01-14 DIAGNOSIS — Z0000 Encounter for general adult medical examination without abnormal findings: Secondary | ICD-10-CM

## 2018-01-14 DIAGNOSIS — Z8546 Personal history of malignant neoplasm of prostate: Secondary | ICD-10-CM

## 2018-01-14 DIAGNOSIS — Z9989 Dependence on other enabling machines and devices: Secondary | ICD-10-CM | POA: Diagnosis not present

## 2018-01-14 DIAGNOSIS — I10 Essential (primary) hypertension: Secondary | ICD-10-CM

## 2018-01-14 DIAGNOSIS — Z89412 Acquired absence of left great toe: Secondary | ICD-10-CM | POA: Insufficient documentation

## 2018-01-14 DIAGNOSIS — G4733 Obstructive sleep apnea (adult) (pediatric): Secondary | ICD-10-CM | POA: Diagnosis not present

## 2018-01-14 MED ORDER — LISINOPRIL 30 MG PO TABS: 30.0000 mg | ORAL_TABLET | Freq: Every day | ORAL | 3 refills | Status: DC

## 2018-01-14 MED ORDER — ROSUVASTATIN CALCIUM 10 MG PO TABS: 10.0000 mg | ORAL_TABLET | Freq: Every day | ORAL | 3 refills | Status: DC

## 2018-01-14 NOTE — Patient Instructions (Addendum)
Thank you for coming to the office today.  Concerned with elevated blood sugar A1c 6.6 - may be at risk for future Diabetes.  Try to follow low carb diet as instructed - follow-up if not improving sooner  Discuss PSA with Dr Baruch Gouty - ask about levels and treatments  Follow-up as planned.  Please schedule a Follow-up Appointment to: Return in about 4 months (around 05/17/2018) for PreDM A1c, HTN.  If you have any other questions or concerns, please feel free to call the office or send a message through [place]. You may also schedule an earlier appointment if necessary.  Additionally, you may be receiving a survey about your experience at our office within a few days to 1 week by e-mail or mail. We value your feedback.  [name], DO [place]

## 2018-01-14 NOTE — Progress Notes (Signed)
Subjective:    Patient ID: Eric Daft., male    DOB: 02-04-1946, 72 y.o.   MRN: [id number]  Eric Stone. is a 72 y.o. male presenting on 01/14/2018 for Annual Exam   HPI   Here for Annual Physical and Lab Review  Specialists: Radiation Oncology - Dr Baruch Gouty (prostate cancer) Dermatology - Dr Nehemiah Massed (general derm and skin cancer history) Surgical Oncology/Dermatology - Dr Andi Devon [facility], L great toe melanoma)  CHRONIC HTN: Reports no concerns, rarely checks BP at home. Current Meds - Lisinopril 30mg  daily Reports good compliance, took meds today. Tolerating well, w/o complaints. - Admits mild varicose veins edema, but improved  Pre-Diabetes: Elevated A1c from 6 up to 6.6 CBGs Never checked. Meds: never on meds Currently on ACEi Lifestyle: - Diet (Still Improved diet, healthier food choices lower carbs, smaller portions, inc water less soda) - Exercise (Improved activity and exercise) Denies hypoglycemia  Mild Hyperlipidemia: - Reports no concerns. Last lipid panel 12/2017, controlled on lifestyle - He takes ASA 81mg  daily for risk reduction - never on Statin before, concern with ASCVD risk  OSA, on CPAP - Patient reports prior history of dx OSA and on CPAP for >20 years, prior to treatment his initial symptoms were snoring, daytime sleepiness and fatigue, he has had several sleep studies in the past. Most recent 29-Jul-2014, with AHI 6.8 per hour, and mild to moderate OSA. - He reports that his sleep apnea is well controlled. He uses the CPAP machine every night. He tolerates the machine well, and thinks that he sleeps better with it and feels good. No new concerns or symptoms.  PMH - Melanoma, Left big toe / History of Skin Cancer S/p amputation - followed by [facility] - Prostate CA, s/p radiation therapy - followed closely by Rad Onc Dr Baruch Gouty for PSA monitoring, had lower PSA now q 6 month testing > he is asking what goal PSA is. Last checked  07/2017  Health Maintenance: - UTD on Flu Shot - Shingrix UTD x 2 doses at pharmacy - UTD Colonoscopy 2014, reported. Do not have record or report available to me at this time. - UTD TDap 2016 - Completed routine Hep C screening, Negative 12/2016 - UTD Pneumonia vaccine, received Pneumovax-23 at age 10 (2014), and then next dose was Prvnar-13 12/2013, has received both after age 22, therefore completed  Depression screen [facility] 2/9 01/14/2018 01/07/2018 10/21/2017  Decreased Interest 0 0 0  Down, Depressed, Hopeless 0 0 0  PHQ - 2 Score 0 0 0    Past Medical History:  Diagnosis Date  . Arthritis   . H/O blood clots   . H/O blood clots    in left leg in 1988  . Rash    occasionally and pt does see a dermatologist  . Sleep apnea    uses a CPAP;sleep study >36yrs ago   Past Surgical History:  Procedure Laterality Date  . TOE SURGERY     melanoma  . 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 VIDEO ASSISTED THORACOSCOPY (VATS)/DECORTICATION Left 05/06/2012   Procedure: VIDEO ASSISTED THORACOSCOPY (VATS) drainage loculated pleural effusion;  Surgeon: Grace Isaac, MD;  Location: [place];  Service: Thoracic;  Laterality: Left;  Marland Kitchen VIDEO BRONCHOSCOPY N/A 05/06/2012   Procedure: VIDEO BRONCHOSCOPY;  Surgeon: Grace Isaac, MD;  Location[facility] OR;  Service: Thoracic;  Laterality: N/A;   Social History   Socioeconomic History  . Marital status: Married  Spouse name: Not on file  . Number of children: Not on file  . Years of education: tech school  . Highest education level: High school graduate  Occupational History  . Occupation: retired  Scientific laboratory technician  . Financial resource strain: Not hard at all  . Food insecurity:    Worry: Never true    Inability: Never true  . Transportation needs:    Medical: No    Non-medical: No  Tobacco Use  . Smoking status: Former Smoker    Packs/day: 1.00    Years: 26.00    Pack years: 26.00    Types: Cigarettes    Last attempt to  quit: 03/26/1989    Years since quitting: 28.8  . Smokeless tobacco: Former Systems developer  . Tobacco comment: less than 1 PPD  Substance and Sexual Activity  . Alcohol use: No    Alcohol/week: 0.0 standard drinks  . Drug use: No  . Sexual activity: Yes  Lifestyle  . Physical activity:    Days per week: 0 days    Minutes per session: 0 min  . Stress: Not at all  Relationships  . Social connections:    Talks on phone: More than three times a week    Gets together: More than three times a week    Attends religious service: More than 4 times per year    Active member of club or organization: No    Attends meetings of clubs or organizations: Never    Relationship status: Married  . Intimate partner violence:    Fear of current or ex partner: No    Emotionally abused: No    Physically abused: No    Forced sexual activity: No  Other Topics Concern  . Not on file  Social History Narrative  . Not on file   Family History  Problem Relation Age of Onset  . Hypertension Mother   . Stroke Mother        possible   Current Outpatient Medications on File Prior to Visit  Medication Sig  . aspirin EC 81 MG tablet Take 81 mg by mouth.  . Crisaborole 2 % OINT   . furosemide (LASIX) 20 MG tablet Take 1 tablet (20 mg total) by mouth daily as needed for edema. For 3-5 days at a time as needed only   No current facility-administered medications on file prior to visit.     Review of Systems  Constitutional: Negative for activity change, appetite change, chills, diaphoresis, fatigue and fever.  HENT: Negative for congestion and hearing loss.   Eyes: Negative for visual disturbance.  Respiratory: Negative for apnea, cough, chest tightness, shortness of breath and wheezing.   Cardiovascular: Negative for chest pain, palpitations and leg swelling.  Gastrointestinal: Negative for abdominal pain, anal bleeding, constipation, diarrhea, nausea and vomiting.  Endocrine: Negative for cold intolerance.   Genitourinary: Negative for decreased urine volume, difficulty urinating, dysuria, frequency, hematuria and testicular pain.  Musculoskeletal: Negative for arthralgias, back pain and neck pain.  Skin: Negative for rash.  Allergic/Immunologic: Negative for environmental allergies.  Neurological: Negative for dizziness, weakness, light-headedness, numbness and headaches.  Hematological: Negative for adenopathy.  Psychiatric/Behavioral: Negative for behavioral problems, dysphoric mood and sleep disturbance. The patient is not nervous/anxious.    Per HPI unless specifically indicated above     Objective:    BP (!) 146/75   Pulse 65   Temp 98.6 F (37 C) (Oral)   Resp 16   Ht 5\' 9"  (1.753 m)  Wt 249 lb (112.9 kg)   BMI 36.77 kg/m   Wt Readings from Last 3 Encounters:  01/14/18 249 lb (112.9 kg)  01/07/18 249 lb 12.8 oz (113.3 kg)  10/21/17 250 lb 9.6 oz (113.7 kg)    Physical Exam  Constitutional: He is oriented to person, place, and time. He appears well-developed and well-nourished. No distress.  Well-appearing, comfortable, cooperative  HENT:  Head: Normocephalic and atraumatic.  Mouth/Throat: Oropharynx is clear and moist.  Frontal / maxillary sinuses non-tender. Nares patent without purulence or edema. Bilateral TMs clear without erythema, effusion or bulging. Oropharynx clear without erythema, exudates, edema or asymmetry.  Eyes: Pupils are equal, round, and reactive to light. Conjunctivae and EOM are normal. Right eye exhibits no discharge. Left eye exhibits no discharge.  Neck: Normal range of motion. Neck supple. No thyromegaly present.  Cardiovascular: Normal rate, regular rhythm, normal heart sounds and intact distal pulses.  No murmur heard. Pulmonary/Chest: Effort normal and breath sounds normal. No respiratory distress. He has no wheezes. He has no rales.  Abdominal: Soft. Bowel sounds are normal. He exhibits no distension and no mass. There is no tenderness.   Musculoskeletal: Normal range of motion. He exhibits no edema or tenderness.  Upper / Lower Extremities: - Normal muscle tone, strength bilateral upper extremities 5/5, lower extremities 5/5  S/p L great toe amputation  Lymphadenopathy:    He has no cervical adenopathy.  Neurological: He is alert and oriented to person, place, and time.  Distal sensation intact to light touch all extremities  Skin: Skin is warm and dry. No rash noted. He is not diaphoretic. No erythema.  Psychiatric: He has a normal mood and affect. His behavior is normal.  Well groomed, good eye contact, normal speech and thoughts  Nursing note and vitals reviewed.  Results for orders placed or performed in visit on 01/07/18  CBC with Differential/Platelet  Result Value Ref Range   WBC 5.4 3.8 - 10.8 Thousand/uL   RBC 4.52 4.20 - 5.80 Million/uL   Hemoglobin 13.3 13.2 - 17.1 g/dL   HCT 39.4 38.5 - 50.0 %   MCV 87.2 80.0 - 100.0 fL   MCH 29.4 27.0 - 33.0 pg   MCHC 33.8 32.0 - 36.0 g/dL   RDW 13.2 11.0 - 15.0 %   Platelets 200 140 - 400 Thousand/uL   MPV 10.5 7.5 - 12.5 fL   Neutro Abs 3,170 1,500 - 7,800 cells/uL   Lymphs Abs 1,269 850 - 3,900 cells/uL   WBC mixed population 562 200 - 950 cells/uL   Eosinophils Absolute 351 15 - 500 cells/uL   Basophils Absolute 49 0 - 200 cells/uL   Neutrophils Relative % 58.7 %   Total Lymphocyte 23.5 %   Monocytes Relative 10.4 %   Eosinophils Relative 6.5 %   Basophils Relative 0.9 %  Lipid panel  Result Value Ref Range   Cholesterol 167 <200 mg/dL   HDL 49 >40 mg/dL   Triglycerides 98 <150 mg/dL   LDL Cholesterol (Calc) 99 mg/dL (calc)   Total CHOL/HDL Ratio 3.4 <5.0 (calc)   Non-HDL Cholesterol (Calc) 118 <130 mg/dL (calc)  Hemoglobin A1c  Result Value Ref Range   Hgb A1c MFr Bld 6.6 (H) <5.7 % of total Hgb   Mean Plasma Glucose 143 (calc)   eAG (mmol/L) 7.9 (calc)  COMPLETE METABOLIC PANEL WITH GFR  Result Value Ref Range   Glucose, Bld 118 (H) 65 - 99  mg/dL   BUN 18 7 - 25  mg/dL   Creat 0.93 0.70 - 1.18 mg/dL   GFR, Est Non African American 83 > OR = 60 mL/min/1.70m2   GFR, Est African American 96 > OR = 60 mL/min/1.64m2   BUN/Creatinine Ratio NOT APPLICABLE 6 - 22 (calc)   Sodium 141 135 - 146 mmol/L   Potassium 4.3 3.5 - 5.3 mmol/L   Chloride 106 98 - 110 mmol/L   CO2 29 20 - 32 mmol/L   Calcium 9.1 8.6 - 10.3 mg/dL   Total Protein 6.6 6.1 - 8.1 g/dL   Albumin 4.0 3.6 - 5.1 g/dL   Globulin 2.6 1.9 - 3.7 g/dL (calc)   AG Ratio 1.5 1.0 - 2.5 (calc)   Total Bilirubin 0.6 0.2 - 1.2 mg/dL   Alkaline phosphatase (APISO) 60 40 - 115 U/L   AST 23 10 - 35 U/L   ALT 25 9 - 46 U/L      Assessment & Plan:   Problem List Items Addressed This Visit    Essential hypertension    Mild elevated BP now - Home BP readings normal, rare check No known complications     Plan:  1. Continue current BP regimen - Lisinopril 30mg  daily 2. Encourage improved lifestyle - low sodium diet, improve regular exercise 3. Continue monitor BP outside office, bring readings to next visit, if persistently >140/90 or new symptoms notify office sooner      Relevant Medications   rosuvastatin (CRESTOR) 10 MG tablet   lisinopril (PRINIVIL,ZESTRIL) 30 MG tablet   H/O prostate cancer    S/p prostate cancer, treated with radiation therapy. Non surgical Initial dx 2015 per [facility], followed by Oncology, now followed by Rad Onc q yearly, last PSA normal range, continue to follow - he will discuss with Onc what goal PSA is      History of amputation of left great toe (HCC)    S/p L great toe amp due to melanoma      Hyperlipidemia    Controlled cholesterol on lifestyle Last lipid panel 12/2017 Calculated ASCVD 10 yr risk score elevated at 19%, due to age, HTN, among other risk factors  Plan: 1. Continue Rosuvastatin 10mg  nightly 2. Continue ASA 81mg  for primary ASCVD risk reduction 3. Encourage improved lifestyle - low carb/cholesterol, reduce portion  size, continue improving regular exercise       Relevant Medications   rosuvastatin (CRESTOR) 10 MG tablet   lisinopril (PRINIVIL,ZESTRIL) 30 MG tablet   OSA on CPAP    Well controlled, chronic mild-mod OSA on CPAP - Good adherence to CPAP nightly - Continue current CPAP therapy, patient is benefiting from therapy      Pre-diabetes    Elevated A1c Encourage improve diet / lifestyle Follow-up q 6 mo       Other Visit Diagnoses    Annual physical exam    -  Primary   Elevated prostate specific antigen (PSA)          Updated Health Maintenance information Reviewed recent lab results with patient Encouraged improvement to lifestyle with diet and exercise - Goal of weight loss  Meds ordered this encounter  Medications  . rosuvastatin (CRESTOR) 10 MG tablet    Sig: Take 1 tablet (10 mg total) by mouth at bedtime.    Dispense:  90 tablet    Refill:  3  . lisinopril (PRINIVIL,ZESTRIL) 30 MG tablet    Sig: Take 1 tablet (30 mg total) by mouth daily. Take 1, 30 mg. Tab each day.  Dispense:  90 tablet    Refill:  3    Follow up plan: Return in about 4 months (around 05/17/2018) for PreDM A1c, HTN.  [name], DO [facility] 01/14/2018, 10:58 AM

## 2018-01-15 NOTE — Assessment & Plan Note (Signed)
S/p prostate cancer, treated with radiation therapy. Non surgical Initial dx 2015 per [facility], followed by Oncology, now followed by Rad Onc q yearly, last PSA normal range, continue to follow - he will discuss with Onc what goal PSA is

## 2018-01-15 NOTE — Assessment & Plan Note (Signed)
Controlled cholesterol on lifestyle Last lipid panel 12/2017 Calculated ASCVD 10 yr risk score elevated at 19%, due to age, HTN, among other risk factors  Plan: 1. Continue Rosuvastatin 10mg  nightly 2. Continue ASA 81mg  for primary ASCVD risk reduction 3. Encourage improved lifestyle - low carb/cholesterol, reduce portion size, continue improving regular exercise

## 2018-01-15 NOTE — Assessment & Plan Note (Signed)
Elevated A1c Encourage improve diet / lifestyle Follow-up q 6 mo

## 2018-01-15 NOTE — Assessment & Plan Note (Signed)
S/p L great toe amp due to melanoma

## 2018-01-15 NOTE — Assessment & Plan Note (Signed)
Mild elevated BP now - Home BP readings normal, rare check No known complications     Plan:  1. Continue current BP regimen - Lisinopril 30mg  daily 2. Encourage improved lifestyle - low sodium diet, improve regular exercise 3. Continue monitor BP outside office, bring readings to next visit, if persistently >140/90 or new symptoms notify office sooner

## 2018-01-15 NOTE — Assessment & Plan Note (Signed)
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 

## 2018-01-29 ENCOUNTER — Inpatient Hospital Stay: Payer: Medicare HMO | Attending: Radiation Oncology

## 2018-01-29 DIAGNOSIS — C61 Malignant neoplasm of prostate: Secondary | ICD-10-CM | POA: Insufficient documentation

## 2018-01-29 LAB — PSA: Prostatic Specific Antigen: 1.53 ng/mL (ref 0.00–4.00)

## 2018-02-05 ENCOUNTER — Other Ambulatory Visit: Payer: Self-pay

## 2018-02-05 ENCOUNTER — Ambulatory Visit
Admission: RE | Admit: 2018-02-05 | Discharge: 2018-02-05 | Disposition: A | Discharge status: Home / Self Care | Payer: Medicare HMO | Source: Ambulatory Visit | Attending: Radiation Oncology | Admitting: Radiation Oncology

## 2018-02-05 ENCOUNTER — Other Ambulatory Visit: Payer: Self-pay | Admitting: *Deleted

## 2018-02-05 ENCOUNTER — Encounter: Payer: Self-pay | Admitting: Radiation Oncology

## 2018-02-05 VITALS — BP 165/84 | HR 78 | Temp 97.5°F | Resp 18 | Wt 251.0 lb

## 2018-02-05 DIAGNOSIS — C61 Malignant neoplasm of prostate: Secondary | ICD-10-CM

## 2018-02-05 DIAGNOSIS — Z923 Personal history of irradiation: Secondary | ICD-10-CM | POA: Insufficient documentation

## 2018-02-05 DIAGNOSIS — R351 Nocturia: Secondary | ICD-10-CM | POA: Diagnosis not present

## 2018-02-05 DIAGNOSIS — R3915 Urgency of urination: Secondary | ICD-10-CM | POA: Insufficient documentation

## 2018-02-05 NOTE — Progress Notes (Signed)
Radiation Oncology Follow up Note  Name: Eric Stone.   Date:   02/05/2018 MRN:  [id number] DOB: Jun 29, 1945    This 72 y.o. male presents to the clinic today for for your follow-up status post I MRT ration therapy for Gleason 6 adenocarcinoma the prostate.  REFERRING PROVIDER: [name] *  HPI: patient is a 72 year old male now out 4 years having completed IM RT radiation therapy to his prostate for Gleason 6 adenocarcinoma presenting the PSA of 4.8. His PSA has jumped around somewhat was.2.68 back in 6 months ago. He had a Lupron injection back in May 2019 his most recent PSA is 1.5. He does complain of some frequency and urgency of urination all only has nocturia 1.  COMPLICATIONS OF TREATMENT: none  FOLLOW UP COMPLIANCE: keeps appointments   PHYSICAL EXAM:  BP (!) 165/84 (BP Location: Left Arm)   Pulse 78   Temp (!) 97.5 F (36.4 C) (Tympanic)   Resp 18   Wt 250 lb 15.9 oz (113.9 kg)   BMI 37.07 kg/m  Well-developed well-nourished patient in NAD. HEENT reveals PERLA, EOMI, discs not visualized.  Oral cavity is clear. No oral mucosal lesions are identified. Neck is clear without evidence of cervical or supraclavicular adenopathy. Lungs are clear to A&P. Cardiac examination is essentially unremarkable with regular rate and rhythm without murmur rub or thrill. Abdomen is benign with no organomegaly or masses noted. Motor sensory and DTR levels are equal and symmetric in the upper and lower extremities. Cranial nerves II through XII are grossly intact. Proprioception is intact. No peripheral adenopathy or edema is identified. No motor or sensory levels are noted. Crude visual fields are within normal range.  RADIOLOGY RESULTS: no current films for review  PLAN: at the present time patient's PSA is declined I will continue to observe at this point and repeat a PSA in 6 months and make further decisions on Lupron therapy at that time. I've also suggested cranberry juice  for some of his frequency and urgency. Patient knows to call with any concerns at any time.  I would like to take this opportunity to thank you for allowing me to participate in the care of your patient.Noreene Filbert, MD

## 2018-02-05 NOTE — Addendum Note (Signed)
Encounter addended by: Noreene Filbert, MD on: 02/05/2018 3:26 PM  Actions taken: LOS modified

## 2018-04-22 DIAGNOSIS — C4372 Malignant melanoma of left lower limb, including hip: Secondary | ICD-10-CM | POA: Diagnosis not present

## 2018-05-16 ENCOUNTER — Ambulatory Visit (INDEPENDENT_AMBULATORY_CARE_PROVIDER_SITE_OTHER): Payer: PPO | Admitting: Family Medicine

## 2018-05-16 ENCOUNTER — Other Ambulatory Visit: Payer: Self-pay

## 2018-05-16 ENCOUNTER — Encounter: Payer: Self-pay | Admitting: Family Medicine

## 2018-05-16 VITALS — BP 150/70 | HR 58 | Temp 98.9°F | Resp 16 | Ht 69.0 in | Wt 256.6 lb

## 2018-05-16 DIAGNOSIS — E1169 Type 2 diabetes mellitus with other specified complication: Secondary | ICD-10-CM

## 2018-05-16 DIAGNOSIS — C61 Malignant neoplasm of prostate: Secondary | ICD-10-CM | POA: Insufficient documentation

## 2018-05-16 DIAGNOSIS — I10 Essential (primary) hypertension: Secondary | ICD-10-CM

## 2018-05-16 DIAGNOSIS — Z89412 Acquired absence of left great toe: Secondary | ICD-10-CM

## 2018-05-16 LAB — POCT GLYCOSYLATED HEMOGLOBIN (HGB A1C): HEMOGLOBIN A1C: 6.6 % — AB (ref 4.0–5.6)

## 2018-05-16 MED ORDER — HYDROCHLOROTHIAZIDE 12.5 MG PO TABS: 12.5000 mg | ORAL_TABLET | Freq: Every day | ORAL | 1 refills | Status: DC

## 2018-05-16 NOTE — Assessment & Plan Note (Signed)
Followed by Dr Esperanza Heir Onc S/p rad therapy, no surgical removal Stable monitoring PSA

## 2018-05-16 NOTE — Progress Notes (Signed)
Subjective:    Patient ID: Eric Daft., male    DOB: 05/30/1945, 73 y.o.   MRN: [id number]  Eric Biondolillo. is a 73 y.o. male presenting on 05/16/2018 for Hypertension   HPI  Specialists: Radiation Oncology - Dr Baruch Gouty (prostate cancer) Dermatology - Dr Nehemiah Massed (general derm and skin cancer history) Surgical Oncology/Dermatology - Dr Andi Devon [facility], L great toe melanoma)  CHRONIC HTN: Reports concern did have high BP at recent dermatology apt SBP 170, rarely checks BP at home.  Current Meds - Lisinopril 30mg  daily Reports good compliance, took meds today. Tolerating well, w/o complaints. - Admits mild varicose veins edema, but improved on compression stocking Denies CP, dyspnea, HA, dizziness / lightheadedness  Type 2 Diabetes, new diagnosis Elevated A1c 6.6 in past, due today, result again 6.6 CBGs Never checked. Meds: neveron meds Currently on ACEi Lifestyle: - Diet (Trying to improve diet, healthier food choices lower carbs, smaller portions, inc water less soda) - Exercise (Improved activity and exercise) - S/p L Great Toe Amputation Denies hypoglycemia, polyuria, visual changes, numbness or tingling.  Melanoma, Left big toe / History of Skin Cancer S/p amputation - followed by [facility]  Prostate CA, s/p radiation therapy - followed closely by Rad Onc Dr Baruch Gouty for PSA monitoring, last result 1.53 (01/2018)   Depression screen [facility] 2/9 05/16/2018 01/14/2018 01/07/2018  Decreased Interest 0 0 0  Down, Depressed, Hopeless 0 0 0  PHQ - 2 Score 0 0 0    Social History   Tobacco Use  . Smoking status: Former Smoker    Packs/day: 1.00    Years: 26.00    Pack years: 26.00    Types: Cigarettes    Last attempt to quit: 03/26/1989    Years since quitting: 29.1  . Smokeless tobacco: Former Systems developer  . Tobacco comment: less than 1 PPD  Substance Use Topics  . Alcohol use: No    Alcohol/week: 0.0 standard drinks  . Drug use: No    Review of Systems Per HPI  unless specifically indicated above     Objective:    BP (!) 150/70 (BP Location: Left Arm, Cuff Size: Normal)   Pulse (!) 58   Temp 98.9 F (37.2 C) (Oral)   Resp 16   Ht 5\' 9"  (1.753 m)   Wt 256 lb 9.6 oz (116.4 kg)   BMI 37.89 kg/m   Wt Readings from Last 3 Encounters:  05/16/18 256 lb 9.6 oz (116.4 kg)  02/05/18 250 lb 15.9 oz (113.9 kg)  01/14/18 249 lb (112.9 kg)    Physical Exam Vitals signs and nursing note reviewed.  Constitutional:      General: He is not in acute distress.    Appearance: He is well-developed. He is not diaphoretic.     Comments: Well-appearing, comfortable, cooperative  HENT:     Head: Normocephalic and atraumatic.  Eyes:     General:        Right eye: No discharge.        Left eye: No discharge.     Conjunctiva/sclera: Conjunctivae normal.  Neck:     Musculoskeletal: Normal range of motion and neck supple.     Thyroid: No thyromegaly.  Cardiovascular:     Rate and Rhythm: Normal rate and regular rhythm.     Heart sounds: Normal heart sounds. No murmur.  Pulmonary:     Effort: Pulmonary effort is normal. No respiratory distress.     Breath sounds: Normal breath sounds.  No wheezing or rales.  Musculoskeletal: Normal range of motion.     Comments: S/p L great toe amputation  Lymphadenopathy:     Cervical: No cervical adenopathy.  Skin:    General: Skin is warm and dry.     Findings: No erythema or rash.  Neurological:     Mental Status: He is alert and oriented to person, place, and time.  Psychiatric:        Behavior: Behavior normal.     Comments: Well groomed, good eye contact, normal speech and thoughts      Recent Labs    01/07/18 0813 05/16/18 1521  HGBA1C 6.6* 6.6*    Results for orders placed or performed in visit on 05/16/18  POCT HgB A1C  Result Value Ref Range   Hemoglobin A1C 6.6 (A) 4.0 - 5.6 %      Assessment & Plan:   Problem List Items Addressed This Visit    Essential hypertension - Primary    Mild  elevated BP again, still elevated despite re-check manual - Home BP readings not available No known complications     Plan:  1. START HCTZ 12.5mg  daily for HTN and also likely for edema, continue Lisinopril 30mg  daily, in future consider dose adjust up to 40mg  2. Encourage improved lifestyle - low sodium diet, improve regular exercise 3. Continue monitor BP outside office, bring readings to next visit, if persistently >140/90 or new symptoms notify office sooner  Discontinued rare furosemide PRN      Relevant Medications   hydrochlorothiazide (HYDRODIURIL) 12.5 MG tablet   History of amputation of left great toe (HCC)    S/p L great toe amputation - due to melanoma      Malignant neoplasm of prostate ([place])    Followed by Dr Esperanza Heir Onc S/p rad therapy, no surgical removal Stable monitoring PSA      Type 2 diabetes mellitus with other specified complication (HCC)    Elevated A1c again >6.5 x 2 readings, consistent with new dx Type 2 Diabetes. However currently well controlled A1c 6.6 No hypoglycemia or hyperglycemia Not on therapy Complications - hyperlipidemia, OSA - increases risk of future cardiovascular complications   Plan:  Discussion on PreDM vs DM new diagnosis, treatment, diet lifestyle intervention, future risk / complications - Encourage improved lifestyle - low carb, low sugar diet, reduce portion size, continue improving regular exercise - Offered future chronic case management referral for comprehensive nursing education on diabetes among other benefits - Continue ASA, ACEi, Statin - FUTURE will do DM Foot exam done today / Advised to schedule DM ophtho exam, send record - Dr Atilano Median in [place] Follow-up 3 months DM A1c       Relevant Orders   POCT HgB A1C (Completed)      Meds ordered this encounter  Medications  . hydrochlorothiazide (HYDRODIURIL) 12.5 MG tablet    Sig: Take 1 tablet (12.5 mg total) by mouth daily.    Dispense:  90 tablet    Refill:   1     Follow up plan: Return in about 3 months (around 08/14/2018) for DM A1c, HTN / also we can schedule CCM.   [name], [facility] 05/16/2018, 3:01 PM

## 2018-05-16 NOTE — Assessment & Plan Note (Addendum)
Elevated A1c again >6.5 x 2 readings, consistent with new dx Type 2 Diabetes. However currently well controlled A1c 6.6 No hypoglycemia or hyperglycemia Not on therapy Complications - hyperlipidemia, OSA - increases risk of future cardiovascular complications   Plan:  Discussion on PreDM vs DM new diagnosis, treatment, diet lifestyle intervention, future risk / complications - Encourage improved lifestyle - low carb, low sugar diet, reduce portion size, continue improving regular exercise - Offered future chronic case management referral for comprehensive nursing education on diabetes among other benefits - Continue ASA, ACEi, Statin - FUTURE will do DM Foot exam done today / Advised to schedule DM ophtho exam, send record - Dr Atilano Median in [facility] Follow-up 3 months DM A1c

## 2018-05-16 NOTE — Assessment & Plan Note (Addendum)
Mild elevated BP again, still elevated despite re-check manual - Home BP readings not available No known complications     Plan:  1. START HCTZ 12.5mg  daily for HTN and also likely for edema, continue Lisinopril 30mg  daily, in future consider dose adjust up to 40mg  2. Encourage improved lifestyle - low sodium diet, improve regular exercise 3. Continue monitor BP outside office, bring readings to next visit, if persistently >140/90 or new symptoms notify office sooner  Discontinued rare furosemide PRN

## 2018-05-16 NOTE — Patient Instructions (Addendum)
Thank you for coming to the office today.  You do have a new diagnosis of Type 2 Diabetes  Recent Labs    01/07/18 0813 05/16/18 1521  HGBA1C 6.6* 6.6*    Next time you see Dr Kathyrn Lass - let him know that you were diagnosed with Type2  Diabetes and that if he can check this for your eye exam and send [place] a copy of the result by fax.  Need this diabetic eye exam once a year.  We will check your feet at next visit as well.  Continue Lisinopril 30mg   New BP medication Hydrochlorothiazide 12.5mg  daliy - this has a fluid pill component too it as well can help your swelling and BP  Try to check BP if you can  If you agree - we will refer you to our Care Management team through [facility] / [place] - they will call you in about 1 month to arrange future follow-up these are Clinical Nurses who can see you in our office for FREE and Call you as well to discuss, health education and help your blood pressure and blood sugar.  Please schedule a Follow-up Appointment to: Return in about 3 months (around 08/14/2018) for DM A1c, HTN / also we can schedule CCM.  If you have any other questions or concerns, please feel free to call the office or send a message through [place]. You may also schedule an earlier appointment if necessary.  Additionally, you may be receiving a survey about your experience at our office within a few days to 1 week by e-mail or mail. We value your feedback.  [name], DO [place]

## 2018-05-16 NOTE — Assessment & Plan Note (Signed)
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 

## 2018-06-19 ENCOUNTER — Ambulatory Visit: Payer: Self-pay | Admitting: Pharmacist

## 2018-06-19 DIAGNOSIS — E1169 Type 2 diabetes mellitus with other specified complication: Secondary | ICD-10-CM

## 2018-06-19 DIAGNOSIS — I10 Essential (primary) hypertension: Secondary | ICD-10-CM

## 2018-06-19 NOTE — Progress Notes (Signed)
  Care Management Note   Eric Stone. is a 73 y.o. year old male who is a primary care patient of Olin Hauser, DO. The CM team was consulted for assistance with chronic disease management and care coordination.   I reached out to [employer]. by phone today. Outreach attempt #1. Left a HIPAA compliant message on the patient's voicemail.   Follow Up Plan: The CM team will reach out to the patient again over the next 4-5 business days days.    Harlow Asa, PharmD, [place] [employer] [phone]

## 2018-06-23 ENCOUNTER — Ambulatory Visit: Payer: Self-pay | Admitting: Pharmacist

## 2018-06-23 DIAGNOSIS — E1169 Type 2 diabetes mellitus with other specified complication: Secondary | ICD-10-CM

## 2018-06-23 DIAGNOSIS — I10 Essential (primary) hypertension: Secondary | ICD-10-CM

## 2018-06-23 NOTE — Progress Notes (Signed)
  Care Management Note   Eric Stone. is a 73 y.o. year old male who is a primary care patient of Olin Hauser, DO. The CM team was consulted for assistance with chronic disease management and care coordination.   I reached out to [employer]. by phone today. Outreach attempt # 2. Woman answering the phone lets me know that [name] is outdoors. Left a HIPAA compliant message for the patient and request a call back.   Follow Up Plan: [place] will reach out to the patient again over the next 7 days.    Harlow Asa, PharmD, [place] [employer] [phone]

## 2018-06-30 ENCOUNTER — Ambulatory Visit: Payer: Self-pay | Admitting: *Deleted

## 2018-06-30 NOTE — Chronic Care Management (AMB) (Addendum)
  Care Management   Note  06/30/2018 Name: Rip Hawes. MRN: [id number] DOB: 09/27/1945  Placed a call to Mr. Murice Barbar. patient of Olin Hauser, DO today to discuss goals for managing their chronic condition of Diabetes. Unable to reach patient at this time, was able to leave a HIPPA compliant voice mail and requested a return call.   Follow up plan: The CM team will reach out to the patient again over the next 7 days.    Merlene Morse Qianna Clagett RN, BSN Nurse Case Editor, [facility] Care Management  [phone]) Business [employer]

## 2018-07-07 ENCOUNTER — Ambulatory Visit: Payer: Self-pay | Admitting: *Deleted

## 2018-07-07 DIAGNOSIS — I10 Essential (primary) hypertension: Secondary | ICD-10-CM

## 2018-07-07 DIAGNOSIS — E1169 Type 2 diabetes mellitus with other specified complication: Secondary | ICD-10-CM

## 2018-07-08 NOTE — Chronic Care Management (AMB) (Signed)
  Care Management   Initial Visit Note  07/08/2018 Name: Eric Stone. MRN: [id number] DOB: Feb 17, 1946  Subjective: Spoke to patient on the phone today. HIPAA identifiers confirmed. Patient stated he would like to learn more about controlling his diabetes.   Objective:  Lab Results  Component Value Date   HGBA1C 6.6 (A) 05/16/2018    Assessment: Skylor Schnapp. is a 73 y.o. year old male who sees [place], Devonne Doughty, DO for primary care. The CCM team was consulted for assistance with chronic disease management related to Disease Management related to Diabetes.    Review of patient status, including review of consultants reports, relevant laboratory and other test results, and collaboration with appropriate care team members and the patient's provider was performed as part of comprehensive patient evaluation and provision of chronic care management services.    SDOH (Social Determinants of Health) screening performed today. See Care Plan Entry related to challenges with: None  Goals Addressed            This Visit's Progress   . "I want to work on my diabetes"  (pt-stated)       Current Barriers:  Marland Kitchen Knowledge Deficits related to basic Diabetes pathophysiology and self care/management  Case Manager Clinical Goal(s):  Over the next 60  days, patient will demonstrate improved adherence to prescribed treatment plan for diabetes self care/management as evidenced by:  . adherence to ADA/ carb modified diet  Interventions:  . Provided education to patient about basic DM disease process . Reviewed scheduled/upcoming provider appointments including: [place] 08/19/18  . Plan to mail ADA/Carb modified diet patient education to patient.  Patient Self Care Activities:  . UNABLE to independently verbalize understanding of diabetic diet . Attends all scheduled provider appointments  Initial goal documentation      . I want to stay safe with this visus going around  (pt-stated)       Current Barriers:  Marland Kitchen Knowledge Deficits related to COVID-19 and impact on patient self health management  Clinical Goal(s):  Marland Kitchen Over the next 30 days, patient will verbalize basic understanding of COVID-19 impact on individual health and self health management as evidenced by verbalization of basic understanding of COVID-19 as a viral disease, measures to prevent exposure, signs and symptoms, when to contact provider  Interventions: . Provided education to patient re: [id number]  Patient Self Care Activities:  . Performs ADL's independently . Performs IADL's independently . Calls provider office for new concerns or questions  Initial goal documentation                       Follow up plan:  The CM team will reach out to the patient again over the next 14 days.   Mr. Dawson was given information about Care Management services today including:  1. Case Management services include personalized support from designated clinical staff supervised by a physician, including individualized plan of care and coordination with other care providers 2. 24/7 contact phone numbers for assistance for urgent and routine care needs. 3. The patient may stop CCM services at any time (effective at the end of the month) by phone call to the office staff.  Patient agreed to services and verbal consent obtained.  Denice Cardon RN, BSN Nurse Case Manager [facility] Care Management  [phone]) Business [employer]

## 2018-07-08 NOTE — Patient Instructions (Addendum)
Thank you allowing the Chronic Care Management Team to be a part of your care! It was a pleasure speaking with you today!  1. Review diabetic diet information sent to in the mail.   CCM (Chronic Care Management) Team   Clayborn Milnes RN, BSN Nurse Care Coordinator  [phone]  Harlow Asa PharmD  Clinical Pharmacist  [phone]  Eula Fried LCSW Clinical Social Worker [phone]  Goals Addressed            This Visit's Progress   . "I want to work on my diabetes"  (pt-stated)       Current Barriers:  Marland Kitchen Knowledge Deficits related to basic Diabetes pathophysiology and self care/management  Case Manager Clinical Goal(s):  Over the next 60  days, patient will demonstrate improved adherence to prescribed treatment plan for diabetes self care/management as evidenced by:  . adherence to ADA/ carb modified diet  Interventions:  . Provided education to patient about basic DM disease process . Reviewed scheduled/upcoming provider appointments including: [place] 08/19/18  . Plan to mail ADA/Carb modified diet patient education to patient.  Patient Self Care Activities:  . UNABLE to independently verbalize understanding of diabetic diet . Attends all scheduled provider appointments  Initial goal documentation      . I want to stay safe with this visus going around (pt-stated)       Current Barriers:  Marland Kitchen Knowledge Deficits related to COVID-19 and impact on patient self health management  Clinical Goal(s):  Marland Kitchen Over the next 30 days, patient will verbalize basic understanding of COVID-19 impact on individual health and self health management as evidenced by verbalization of basic understanding of COVID-19 as a viral disease, measures to prevent exposure, signs and symptoms, when to contact provider  Interventions: . Provided education to patient re: [id number]  Patient Self Care Activities:  . Performs ADL's independently . Performs IADL's independently . Calls  provider office for new concerns or questions  Initial goal documentation                      The patient verbalized understanding of instructions provided today and declined a print copy of patient instruction materials.   The CM team will reach out to the patient again over the next 14 days.   Carbohydrate Counting for Diabetes Mellitus, Adult  Carbohydrate counting is a method of keeping track of how many carbohydrates you eat. Eating carbohydrates naturally increases the amount of sugar (glucose) in the blood. Counting how many carbohydrates you eat helps keep your blood glucose within normal limits, which helps you manage your diabetes (diabetes mellitus). It is important to know how many carbohydrates you can safely have in each meal. This is different for every person. A diet and nutrition specialist (registered dietitian) can help you make a meal plan and calculate how many carbohydrates you should have at each meal and snack. Carbohydrates are found in the following foods:  Grains, such as breads and cereals.  Dried beans and soy products.  Starchy vegetables, such as potatoes, peas, and corn.  Fruit and fruit juices.  Milk and yogurt.  Sweets and snack foods, such as cake, cookies, candy, chips, and soft drinks. How do I count carbohydrates? There are two ways to count carbohydrates in food. You can use either of the methods or a combination of both. Reading "Nutrition Facts" on packaged food The "Nutrition Facts" list is included on the labels of almost all packaged foods and beverages in  the [place] It includes:  The serving size.  Information about nutrients in each serving, including the grams (g) of carbohydrate per serving. To use the "Nutrition Facts":  Decide how many servings you will have.  Multiply the number of servings by the number of carbohydrates per serving.  The resulting number is the total amount of carbohydrates that you will be  having. Learning standard serving sizes of other foods When you eat carbohydrate foods that are not packaged or do not include "Nutrition Facts" on the label, you need to measure the servings in order to count the amount of carbohydrates:  Measure the foods that you will eat with a food scale or measuring cup, if needed.  Decide how many standard-size servings you will eat.  Multiply the number of servings by 15. Most carbohydrate-rich foods have about 15 g of carbohydrates per serving. ? For example, if you eat 8 oz (170 g) of strawberries, you will have eaten 2 servings and 30 g of carbohydrates (2 servings x 15 g = 30 g).  For foods that have more than one food mixed, such as soups and casseroles, you must count the carbohydrates in each food that is included. The following list contains standard serving sizes of common carbohydrate-rich foods. Each of these servings has about 15 g of carbohydrates:   hamburger bun or  English muffin.   oz (15 mL) syrup.   oz (14 g) jelly.  1 slice of bread.  1 six-inch tortilla.  3 oz (85 g) cooked rice or pasta.  4 oz (113 g) cooked dried beans.  4 oz (113 g) starchy vegetable, such as peas, corn, or potatoes.  4 oz (113 g) hot cereal.  4 oz (113 g) mashed potatoes or  of a large baked potato.  4 oz (113 g) canned or frozen fruit.  4 oz (120 mL) fruit juice.  4-6 crackers.  6 chicken nuggets.  6 oz (170 g) unsweetened dry cereal.  6 oz (170 g) plain fat-free yogurt or yogurt sweetened with artificial sweeteners.  8 oz (240 mL) milk.  8 oz (170 g) fresh fruit or one small piece of fruit.  24 oz (680 g) popped popcorn. Example of carbohydrate counting Sample meal  3 oz (85 g) chicken breast.  6 oz (170 g) brown rice.  4 oz (113 g) corn.  8 oz (240 mL) milk.  8 oz (170 g) strawberries with sugar-free whipped topping. Carbohydrate calculation 1. Identify the foods that contain carbohydrates: ? Rice. ? Corn. ?  Milk. ? Strawberries. 2. Calculate how many servings you have of each food: ? 2 servings rice. ? 1 serving corn. ? 1 serving milk. ? 1 serving strawberries. 3. Multiply each number of servings by 15 g: ? 2 servings rice x 15 g = 30 g. ? 1 serving corn x 15 g = 15 g. ? 1 serving milk x 15 g = 15 g. ? 1 serving strawberries x 15 g = 15 g. 4. Add together all of the amounts to find the total grams of carbohydrates eaten: ? 30 g + 15 g + 15 g + 15 g = 75 g of carbohydrates total. Summary  Carbohydrate counting is a method of keeping track of how many carbohydrates you eat.  Eating carbohydrates naturally increases the amount of sugar (glucose) in the blood.  Counting how many carbohydrates you eat helps keep your blood glucose within normal limits, which helps you manage your diabetes.  A diet and  nutrition specialist (registered dietitian) can help you make a meal plan and calculate how many carbohydrates you should have at each meal and snack. This information is not intended to replace advice given to you by your health care provider. Make sure you discuss any questions you have with your health care provider. Document Released: 03/12/2005 Document Revised: 09/19/2016 Document Reviewed: 08/24/2015 Elsevier Interactive Patient Education  2019 [employer].

## 2018-07-16 ENCOUNTER — Encounter: Payer: Medicare HMO | Admitting: Family Medicine

## 2018-07-21 ENCOUNTER — Encounter: Payer: Self-pay | Admitting: *Deleted

## 2018-07-21 ENCOUNTER — Ambulatory Visit (INDEPENDENT_AMBULATORY_CARE_PROVIDER_SITE_OTHER): Payer: PPO | Admitting: *Deleted

## 2018-07-21 ENCOUNTER — Telehealth: Payer: Self-pay

## 2018-07-21 ENCOUNTER — Ambulatory Visit: Payer: Self-pay | Admitting: *Deleted

## 2018-07-21 DIAGNOSIS — Z85820 Personal history of malignant melanoma of skin: Secondary | ICD-10-CM | POA: Diagnosis not present

## 2018-07-21 DIAGNOSIS — D485 Neoplasm of uncertain behavior of skin: Secondary | ICD-10-CM | POA: Diagnosis not present

## 2018-07-21 DIAGNOSIS — L820 Inflamed seborrheic keratosis: Secondary | ICD-10-CM | POA: Diagnosis not present

## 2018-07-21 DIAGNOSIS — I8310 Varicose veins of unspecified lower extremity with inflammation: Secondary | ICD-10-CM | POA: Diagnosis not present

## 2018-07-21 DIAGNOSIS — Z85828 Personal history of other malignant neoplasm of skin: Secondary | ICD-10-CM | POA: Diagnosis not present

## 2018-07-21 DIAGNOSIS — L578 Other skin changes due to chronic exposure to nonionizing radiation: Secondary | ICD-10-CM | POA: Diagnosis not present

## 2018-07-21 DIAGNOSIS — L821 Other seborrheic keratosis: Secondary | ICD-10-CM | POA: Diagnosis not present

## 2018-07-21 DIAGNOSIS — Z1283 Encounter for screening for malignant neoplasm of skin: Secondary | ICD-10-CM | POA: Diagnosis not present

## 2018-07-21 DIAGNOSIS — D1800 Hemangioma unspecified site: Secondary | ICD-10-CM | POA: Diagnosis not present

## 2018-07-21 DIAGNOSIS — E1169 Type 2 diabetes mellitus with other specified complication: Secondary | ICD-10-CM

## 2018-07-21 DIAGNOSIS — I10 Essential (primary) hypertension: Secondary | ICD-10-CM | POA: Diagnosis not present

## 2018-07-21 DIAGNOSIS — D225 Melanocytic nevi of trunk: Secondary | ICD-10-CM | POA: Diagnosis not present

## 2018-07-21 NOTE — Chronic Care Management (AMB) (Signed)
  Chronic Care Management   Follow Up Note   07/21/2018 Name: Eric Stone. MRN: [id number] DOB: Aug 21, 1945  Referred by: Olin Hauser, DO Reason for referral : Chronic Care Management (Diabetes)   Eric Stone. is a 73 y.o. year old male who is a primary care patient of Olin Hauser, DO. The CCM team was consulted for assistance with chronic disease management and care coordination needs.    Review of patient status, including review of consultants reports, relevant laboratory and other test results, and collaboration with appropriate care team members and the patient's provider was performed as part of comprehensive patient evaluation and provision of chronic care management services.    Patient called RN CM back. Patient received patient education on diabetes diet. Patient stated he recognized he was eating more potatoes than he should and plans to continue to reduce carbs in his diet. 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   Lab Results  Component Value Date   HGBA1C 6.6 (A) 05/16/2018   HGBA1C 6.6 (H) 01/07/2018   HGBA1C 6.0 (H) 12/26/2016   Lab Results  Component Value Date   LDLCALC 99 01/07/2018   CREATININE 0.93 01/07/2018    Goals Addressed            This Visit's Progress   . "I want to work on my diabetes"  (pt-stated)       Current Barriers:  Marland Kitchen Knowledge Deficits related to basic Diabetes pathophysiology and self care/management  Case Manager Clinical Goal(s):  Over the next 60  days, patient will demonstrate improved adherence to prescribed treatment plan for diabetes self care/management as evidenced by:  . adherence to ADA/ carb modified diet  Interventions:  . Provided education to patient about basic DM disease process . Reviewed scheduled/upcoming provider appointments including: [place] 08/19/18  . Plan to mail ADA/Carb modified diet patient education to  patient. . Patient received mailed information about diabetes diet. . Reviewed mailed information.-patient reports needing to reduce potatoes in his diet.  Patient Self Care Activities:  . UNABLE to independently verbalize understanding of diabetic diet . Attends all scheduled provider appointments  Please see past updates related to this goal by clicking on the "Past Updates" button in the selected goal           The CM team will reach out to the patient again over the next 30 days.   Merlene Morse Paisely Brick RN, BSN Nurse Case Pharmacist, [facility]  [phone]) [employer]

## 2018-07-21 NOTE — Chronic Care Management (AMB) (Signed)
  Care Management   Note  07/21/2018 Name: Eric Stone. MRN: [id number] DOB: 1945-09-09  Placed a call to [name]. patient of Olin Hauser, DO today to discuss goals for managing their chronic condition of Diabetes. Unable to reach patient at this time, was able to leave a HIPPA compliant voice mail and requested a return call.   Follow Up Plan: A HIPPA compliant phone message was left for the patient providing contact information and requesting a return call.  The CM team will reach out to the patient again over the next 14 days.   Merlene Morse Emmanuel Ercole RN, BSN Nurse Case Pharmacist, [facility]  [phone]) [employer]

## 2018-07-21 NOTE — Patient Instructions (Signed)
Thank you allowing the Chronic Care Management Team to be a part of your care! It was a pleasure speaking with you today!   CCM (Chronic Care Management) Team   Tauriel Scronce RN, BSN Nurse Care Coordinator  [phone]  Harlow Asa PharmD  Clinical Pharmacist  [phone]  Eula Fried LCSW Clinical Social Worker [phone]  Goals Addressed            This Visit's Progress   . "I want to work on my diabetes"  (pt-stated)       Current Barriers:  Marland Kitchen Knowledge Deficits related to basic Diabetes pathophysiology and self care/management  Case Manager Clinical Goal(s):  Over the next 60  days, patient will demonstrate improved adherence to prescribed treatment plan for diabetes self care/management as evidenced by:  . adherence to ADA/ carb modified diet  Interventions:  . Provided education to patient about basic DM disease process . Reviewed scheduled/upcoming provider appointments including: [place] 08/19/18  . Plan to mail ADA/Carb modified diet patient education to patient. . Patient received mailed information about diabetes diet. . Reviewed mailed information.-patient reports needing to reduce potatoes in his diet.  Patient Self Care Activities:  . UNABLE to independently verbalize understanding of diabetic diet . Attends all scheduled provider appointments  Please see past updates related to this goal by clicking on the "Past Updates" button in the selected goal          The patient verbalized understanding of instructions provided today and declined a print copy of patient instruction materials.   The CM team will reach out to the patient again over the next 30 days.    Diabetes Mellitus and Foot Care Foot care is an important part of your health, especially when you have diabetes. Diabetes may cause you to have problems because of poor blood flow (circulation) to your feet and legs, which can cause your skin to:  Become thinner and drier.   Break more easily.  Heal more slowly.  Peel and crack. You may also have nerve damage (neuropathy) in your legs and feet, causing decreased feeling in them. This means that you may not notice Macarthur Lorusso injuries to your feet that could lead to more serious problems. Noticing and addressing any potential problems early is the best way to prevent future foot problems. How to care for your feet Foot hygiene  Wash your feet daily with warm water and mild soap. Do not use hot water. Then, pat your feet and the areas between your toes until they are completely dry. Do not soak your feet as this can dry your skin.  Trim your toenails straight across. Do not dig under them or around the cuticle. File the edges of your nails with an emery board or nail file.  Apply a moisturizing lotion or petroleum jelly to the skin on your feet and to dry, brittle toenails. Use lotion that does not contain alcohol and is unscented. Do not apply lotion between your toes. Shoes and socks  Wear clean socks or stockings every day. Make sure they are not too tight. Do not wear knee-high stockings since they may decrease blood flow to your legs.  Wear shoes that fit properly and have enough cushioning. Always look in your shoes before you put them on to be sure there are no objects inside.  To break in new shoes, wear them for just a few hours a day. This prevents injuries on your feet. Wounds, scrapes, corns, and calluses  Check  your feet daily for blisters, cuts, bruises, sores, and redness. If you cannot see the bottom of your feet, use a mirror or ask someone for help.  Do not cut corns or calluses or try to remove them with medicine.  If you find a Eric Stone scrape, cut, or break in the skin on your feet, keep it and the skin around it clean and dry. You may clean these areas with mild soap and water. Do not clean the area with peroxide, alcohol, or iodine.  If you have a wound, scrape, corn, or callus on your foot, look  at it several times a day to make sure it is healing and not infected. Check for: ? Redness, swelling, or pain. ? Fluid or blood. ? Warmth. ? Pus or a bad smell. General instructions  Do not cross your legs. This may decrease blood flow to your feet.  Do not use heating pads or hot water bottles on your feet. They may burn your skin. If you have lost feeling in your feet or legs, you may not know this is happening until it is too late.  Protect your feet from hot and cold by wearing shoes, such as at the beach or on hot pavement.  Schedule a complete foot exam at least once a year (annually) or more often if you have foot problems. If you have foot problems, report any cuts, sores, or bruises to your health care provider immediately. Contact a health care provider if:  You have a medical condition that increases your risk of infection and you have any cuts, sores, or bruises on your feet.  You have an injury that is not healing.  You have redness on your legs or feet.  You feel burning or tingling in your legs or feet.  You have pain or cramps in your legs and feet.  Your legs or feet are numb.  Your feet always feel cold.  You have pain around a toenail. Get help right away if:  You have a wound, scrape, corn, or callus on your foot and: ? You have pain, swelling, or redness that gets worse. ? You have fluid or blood coming from the wound, scrape, corn, or callus. ? Your wound, scrape, corn, or callus feels warm to the touch. ? You have pus or a bad smell coming from the wound, scrape, corn, or callus. ? You have a fever. ? You have a red line going up your leg. Summary  Check your feet every day for cuts, sores, red spots, swelling, and blisters.  Moisturize feet and legs daily.  Wear shoes that fit properly and have enough cushioning.  If you have foot problems, report any cuts, sores, or bruises to your health care provider immediately.  Schedule a complete foot  exam at least once a year (annually) or more often if you have foot problems. This information is not intended to replace advice given to you by your health care provider. Make sure you discuss any questions you have with your health care provider. Document Released: 03/09/2000 Document Revised: 04/24/2017 Document Reviewed: 04/13/2016 Elsevier Interactive Patient Education  2019 [employer].

## 2018-07-30 ENCOUNTER — Other Ambulatory Visit: Payer: Medicare HMO

## 2018-08-06 ENCOUNTER — Ambulatory Visit: Payer: Medicare HMO | Admitting: Radiation Oncology

## 2018-08-19 ENCOUNTER — Ambulatory Visit: Payer: PPO | Admitting: Family Medicine

## 2018-08-25 ENCOUNTER — Telehealth: Payer: Self-pay

## 2018-08-28 ENCOUNTER — Telehealth: Payer: Self-pay

## 2018-09-01 ENCOUNTER — Ambulatory Visit: Payer: Self-pay | Admitting: *Deleted

## 2018-09-01 ENCOUNTER — Telehealth: Payer: Self-pay

## 2018-09-01 DIAGNOSIS — E1169 Type 2 diabetes mellitus with other specified complication: Secondary | ICD-10-CM

## 2018-09-01 NOTE — Chronic Care Management (AMB) (Signed)
  Chronic Care Management   Outreach Note  09/01/2018 Name: Eric Stone. MRN: [id number] DOB: 02-28-1946  Referred by: Olin Hauser, DO Reason for referral : Chronic Care Management (DM education)   An unsuccessful telephone outreach was attempted today. The patient was referred to the case management team by for assistance with chronic care management and care coordination.  spouse answered the phone and stated patient was unavailable at this time. She stated later in the week after 4 would be the best time to reach him.    Follow Up Plan: The care management team will reach out to the patient again over the next 7 days.    Merlene Morse Xzavior Reinig RN, BSN Nurse Case Pharmacist, [facility]  [phone]) [employer]

## 2018-09-03 ENCOUNTER — Encounter: Payer: Self-pay | Admitting: *Deleted

## 2018-09-04 ENCOUNTER — Ambulatory Visit (INDEPENDENT_AMBULATORY_CARE_PROVIDER_SITE_OTHER): Payer: PPO | Admitting: *Deleted

## 2018-09-04 DIAGNOSIS — E1169 Type 2 diabetes mellitus with other specified complication: Secondary | ICD-10-CM | POA: Diagnosis not present

## 2018-09-04 DIAGNOSIS — I10 Essential (primary) hypertension: Secondary | ICD-10-CM | POA: Diagnosis not present

## 2018-09-04 NOTE — Chronic Care Management (AMB) (Signed)
  Chronic Care Management   Follow Up Note   09/04/2018 Name: Eric Stone. MRN: [id number] DOB: [birth year]  Referred by: Olin Hauser, DO Reason for referral : No chief complaint on file.   Eric Stone. is a 73 y.o. year old male who is a primary care patient of Olin Hauser, DO. The CCM team was consulted for assistance with chronic disease management and care coordination needs.    Review of patient status, including review of consultants reports, relevant laboratory and other test results, and collaboration with appropriate care team members and the patient's provider was performed as part of comprehensive patient evaluation and provision of chronic care management services.    Goals Addressed            This Visit's Progress   . "I want to work on my diabetes"  (pt-stated)       Current Barriers:  Marland Kitchen Knowledge Deficits related to basic Diabetes pathophysiology and self care/management  Case Manager Clinical Goal(s):  Over the next 60  days, patient will demonstrate improved adherence to prescribed treatment plan for diabetes self care/management as evidenced by:  . adherence to ADA/ carb modified diet  Interventions:  . Reviewed strategies to reduce carbs in patient's diet . 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 Patient has a recheck on labs end of June.  . Will plan to f/u with patient after labs   Patient Self Care Activities:  . UNABLE to independently verbalize understanding of diabetic diet . Attends all scheduled provider appointments  Please see past updates related to this goal by clicking on the "Past Updates" button in the selected goal       . COMPLETED: I want to stay safe with this visus going around (pt-stated)       Current Barriers:  Marland Kitchen Knowledge Deficits related to COVID-19 and impact on patient self health management  Clinical Goal(s):  Marland Kitchen Over the next 30 days, patient will  verbalize basic understanding of COVID-19 impact on individual health and self health management as evidenced by verbalization of basic understanding of COVID-19 as a viral disease, measures to prevent exposure, signs and symptoms, when to contact provider  Interventions: . Provided education to patient re: [id number]  Patient Self Care Activities:  . Performs ADL's independently . Performs IADL's independently . Calls provider office for new concerns or questions  Please see past updates related to this goal by clicking on the "Past Updates" button in the selected goal                        The care management team will reach out to the patient again over the next 30 days.    Merlene Morse Trevion Hoben RN, BSN Nurse Case Pharmacist, [facility]  [phone]) [employer]

## 2018-09-04 NOTE — Patient Instructions (Signed)
Thank you allowing the Chronic Care Management Team to be a part of your care! It was a pleasure speaking with you today!   CCM (Chronic Care Management) Team   Shakeisha Horine RN, BSN Nurse Care Coordinator  [phone]  [facility] PharmD  Clinical Pharmacist  [phone]  Eula Fried LCSW Clinical Social Worker [phone]  Goals Addressed            This Visit's Progress   . "I want to work on my diabetes"  (pt-stated)       Current Barriers:  Marland Kitchen Knowledge Deficits related to basic Diabetes pathophysiology and self care/management  Case Manager Clinical Goal(s):  Over the next 60  days, patient will demonstrate improved adherence to prescribed treatment plan for diabetes self care/management as evidenced by:  . adherence to ADA/ carb modified diet  Interventions:  . Reviewed strategies to reduce carbs in patient's diet . 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 Patient has a recheck on labs end of June.  . Will plan to f/u with patient after labs   Patient Self Care Activities:  . UNABLE to independently verbalize understanding of diabetic diet . Attends all scheduled provider appointments  Please see past updates related to this goal by clicking on the "Past Updates" button in the selected goal       . COMPLETED: I want to stay safe with this visus going around (pt-stated)       Current Barriers:  Marland Kitchen Knowledge Deficits related to COVID-19 and impact on patient self health management  Clinical Goal(s):  Marland Kitchen Over the next 30 days, patient will verbalize basic understanding of COVID-19 impact on individual health and self health management as evidenced by verbalization of basic understanding of COVID-19 as a viral disease, measures to prevent exposure, signs and symptoms, when to contact provider  Interventions: . Provided education to patient re: [id number]  Patient Self Care Activities:  . Performs ADL's  independently . Performs IADL's independently . Calls provider office for new concerns or questions  Please see past updates related to this goal by clicking on the "Past Updates" button in the selected goal                       The patient verbalized understanding of instructions provided today and declined a print copy of patient instruction materials.   The care management team will reach out to the patient again over the next 30 days.   Dehydration, Adult  Dehydration is when there is not enough fluid or water in your body. This happens when you lose more fluids than you take in. Dehydration can range from mild to very bad. It should be treated right away to keep it from getting very bad. Symptoms of mild dehydration may include:  Thirst.  Dry lips.  Slightly dry mouth.  Dry, warm skin.  Dizziness. Symptoms of moderate dehydration may include:  Very dry mouth.  Muscle cramps.  Dark pee (urine). Pee may be the color of tea.  Your body making less pee.  Your eyes making fewer tears.  Heartbeat that is uneven or faster than normal (palpitations).  Headache.  Light-headedness, especially when you stand up from sitting.  Fainting (syncope). Symptoms of very bad dehydration may include:  Changes in skin, such as: ? Cold and clammy skin. ? Blotchy (mottled) or pale skin. ? Skin that does not quickly return to normal after  being lightly pinched and let go (poor skin turgor).  Changes in body fluids, such as: ? Feeling very thirsty. ? Your eyes making fewer tears. ? Not sweating when body temperature is high, such as in hot weather. ? Your body making very little pee.  Changes in vital signs, such as: ? Weak pulse. ? Pulse that is more than 100 beats a minute when you are sitting still. ? Fast breathing. ? Low blood pressure.  Other changes, such as: ? Sunken eyes. ? Cold hands and feet. ? Confusion. ? Lack of energy (lethargy). ?  Trouble waking up from sleep. ? Short-term weight loss. ? Unconsciousness. Follow these instructions at home:   If told by your doctor, drink an ORS: ? Make an ORS by using instructions on the package. ? Start by drinking small amounts, about  cup (120 mL) every 5-10 minutes. ? Slowly drink more until you have had the amount that your doctor said to have.  Drink enough clear fluid to keep your pee clear or pale yellow. If you were told to drink an ORS, finish the ORS first, then start slowly drinking clear fluids. Drink fluids such as: ? Water. Do not drink only water by itself. Doing that can make the salt (sodium) level in your body get too low (hyponatremia). ? Ice chips. ? Fruit juice that you have added water to (diluted). ? Low-calorie sports drinks.  Avoid: ? Alcohol. ? Drinks that have a lot of sugar. These include high-calorie sports drinks, fruit juice that does not have water added, and soda. ? Caffeine. ? Foods that are greasy or have a lot of fat or sugar.  Take over-the-counter and prescription medicines only as told by your doctor.  Do not take salt tablets. Doing that can make the salt level in your body get too high (hypernatremia).  Eat foods that have minerals (electrolytes). Examples include bananas, oranges, potatoes, tomatoes, and spinach.  Keep all follow-up visits as told by your doctor. This is important. Contact a doctor if:  You have belly (abdominal) pain that: ? Gets worse. ? Stays in one area (localizes).  You have a rash.  You have a stiff neck.  You get angry or annoyed more easily than normal (irritability).  You are more sleepy than normal.  You have a harder time waking up than normal.  You feel: ? Weak. ? Dizzy. ? Very thirsty.  You have peed (urinated) only a small amount of very dark pee during 6-8 hours. Get help right away if:  You have symptoms of very bad dehydration.  You cannot drink fluids without throwing up  (vomiting).  Your symptoms get worse with treatment.  You have a fever.  You have a very bad headache.  You are throwing up or having watery poop (diarrhea) and it: ? Gets worse. ? Does not go away.  You have blood or something green (bile) in your throw-up.  You have blood in your poop (stool). This may cause poop to look black and tarry.  You have not peed in 6-8 hours.  You pass out (faint).  Your heart rate when you are sitting still is more than 100 beats a minute.  You have trouble breathing. This information is not intended to replace advice given to you by your health care provider. Make sure you discuss any questions you have with your health care provider. Document Released: 01/06/2009 Document Revised: 09/30/2015 Document Reviewed: 05/06/2015 Elsevier Interactive Patient Education  2019 [employer].

## 2018-09-08 DIAGNOSIS — L821 Other seborrheic keratosis: Secondary | ICD-10-CM | POA: Diagnosis not present

## 2018-09-08 DIAGNOSIS — I8393 Asymptomatic varicose veins of bilateral lower extremities: Secondary | ICD-10-CM | POA: Diagnosis not present

## 2018-09-08 DIAGNOSIS — I781 Nevus, non-neoplastic: Secondary | ICD-10-CM | POA: Diagnosis not present

## 2018-09-08 DIAGNOSIS — I8311 Varicose veins of right lower extremity with inflammation: Secondary | ICD-10-CM | POA: Diagnosis not present

## 2018-09-08 DIAGNOSIS — L820 Inflamed seborrheic keratosis: Secondary | ICD-10-CM | POA: Diagnosis not present

## 2018-09-08 DIAGNOSIS — L578 Other skin changes due to chronic exposure to nonionizing radiation: Secondary | ICD-10-CM | POA: Diagnosis not present

## 2018-09-11 ENCOUNTER — Inpatient Hospital Stay: Payer: PPO | Attending: Radiation Oncology

## 2018-09-11 ENCOUNTER — Other Ambulatory Visit: Payer: Self-pay

## 2018-09-11 DIAGNOSIS — C61 Malignant neoplasm of prostate: Secondary | ICD-10-CM

## 2018-09-11 LAB — PSA: Prostatic Specific Antigen: 4.83 ng/mL — ABNORMAL HIGH (ref 0.00–4.00)

## 2018-09-15 ENCOUNTER — Other Ambulatory Visit: Payer: Self-pay

## 2018-09-17 ENCOUNTER — Ambulatory Visit (INDEPENDENT_AMBULATORY_CARE_PROVIDER_SITE_OTHER): Payer: PPO | Admitting: Family Medicine

## 2018-09-17 ENCOUNTER — Encounter: Payer: Self-pay | Admitting: Family Medicine

## 2018-09-17 ENCOUNTER — Other Ambulatory Visit: Payer: Self-pay

## 2018-09-17 ENCOUNTER — Other Ambulatory Visit: Payer: Self-pay | Admitting: Family Medicine

## 2018-09-17 VITALS — BP 105/59 | HR 71 | Temp 98.7°F | Ht 69.0 in | Wt 243.2 lb

## 2018-09-17 DIAGNOSIS — I10 Essential (primary) hypertension: Secondary | ICD-10-CM | POA: Diagnosis not present

## 2018-09-17 DIAGNOSIS — E1169 Type 2 diabetes mellitus with other specified complication: Secondary | ICD-10-CM | POA: Diagnosis not present

## 2018-09-17 DIAGNOSIS — G4733 Obstructive sleep apnea (adult) (pediatric): Secondary | ICD-10-CM | POA: Diagnosis not present

## 2018-09-17 DIAGNOSIS — E7800 Pure hypercholesterolemia, unspecified: Secondary | ICD-10-CM | POA: Diagnosis not present

## 2018-09-17 DIAGNOSIS — Z0000 Encounter for general adult medical examination without abnormal findings: Secondary | ICD-10-CM

## 2018-09-17 LAB — POCT GLYCOSYLATED HEMOGLOBIN (HGB A1C): Hemoglobin A1C: 6.5 % — AB (ref 4.0–5.6)

## 2018-09-17 MED ORDER — ROSUVASTATIN CALCIUM 10 MG PO TABS: 10.0000 mg | ORAL_TABLET | Freq: Every day | ORAL | 3 refills | Status: DC

## 2018-09-17 NOTE — Assessment & Plan Note (Addendum)
Improved HTN now on thiazide - Home BP readings not available No known complications  OFF Furosemide    Plan:  1. Continue HCTZ 12.5mg  daily for HTN and also likely for edema, continue Lisinopril 30mg  daily 2. Encourage improved lifestyle - low sodium diet, improve regular exercise 3. Continue monitor BP outside office, bring readings to next visit, if persistently >140/90 or new symptoms notify office sooner

## 2018-09-17 NOTE — Assessment & Plan Note (Signed)
Controlled cholesterol on lifestyle Last lipid panel 12/2017 Calculated ASCVD 10 yr risk score elevated at 19%, due to age, HTN, among other risk factors  Plan: 1. Continue Rosuvastatin 10mg  nightly - refilled 2. Continue ASA 81mg  for primary ASCVD risk reduction 3. Encourage improved lifestyle - low carb/cholesterol, reduce portion size, continue improving regular exercise

## 2018-09-17 NOTE — Progress Notes (Signed)
Subjective:    Patient ID: Eric Daft., male    DOB: 07-16-1945, 73 y.o.   MRN: [id number]  Eric Calico. is a 73 y.o. male presenting on 09/17/2018 for Diabetes and Hypertension   HPI   CHRONIC HTN: Improved BP readings. Current Meds - Lisinopril 30mg  daily Reports good compliance, took meds today. Tolerating well, w/o complaints. - Admits mild varicose veins edema, but improved on compression stocking Denies CP, dyspnea, HA, dizziness / lightheadedness  Type 2 Diabetes Previous readings 6.6. Today due for A1c. CBGs Never checked. Meds: neveron meds Currently on ACEi Lifestyle: - Diet (Trying to improve diet, healthier food choices lower carbs, smaller portions, inc water less soda) - Exercise (Improved activity and exercise) - S/p L Great Toe Amputation Due DM Eye Exam - has scheduled w/ Dr Atilano Median in [place] in Fall 2020 Denies hypoglycemia, polyuria, visual changes, numbness or tingling.  HYPERLIPIDEMIA: - Reports no concerns. Last lipid panel 12/2017, controlled  - Currently taking Rosuvastatin 10mg  daily, tolerating well without side effects or myalgias - Needs refill    Depression screen [facility] 2/9 05/16/2018 01/14/2018 01/07/2018  Decreased Interest 0 0 0  Down, Depressed, Hopeless 0 0 0  PHQ - 2 Score 0 0 0    Social History   Tobacco Use  . Smoking status: Former Smoker    Packs/day: 1.00    Years: 26.00    Pack years: 26.00    Types: Cigarettes    Quit date: 03/26/1989    Years since quitting: 29.4  . Smokeless tobacco: Former Systems developer  . Tobacco comment: less than 1 PPD  Substance Use Topics  . Alcohol use: No    Alcohol/week: 0.0 standard drinks  . Drug use: No    Review of Systems Per HPI unless specifically indicated above     Objective:    BP (!) 105/59 (BP Location: Left Arm, Patient Position: Sitting, Cuff Size: Large)   Pulse 71   Temp 98.7 F (37.1 C) (Oral)   Ht 5\' 9"  (1.753 m)   Wt 243 lb 3.2 oz (110.3 kg)   BMI 35.91 kg/m    Wt Readings from Last 3 Encounters:  09/17/18 243 lb 3.2 oz (110.3 kg)  05/16/18 256 lb 9.6 oz (116.4 kg)  02/05/18 250 lb 15.9 oz (113.9 kg)    Physical Exam Vitals signs and nursing note reviewed.  Constitutional:      General: He is not in acute distress.    Appearance: He is well-developed. He is not diaphoretic.     Comments: Well-appearing, comfortable, cooperative  HENT:     Head: Normocephalic and atraumatic.  Eyes:     General:        Right eye: No discharge.        Left eye: No discharge.     Conjunctiva/sclera: Conjunctivae normal.  Neck:     Musculoskeletal: Normal range of motion and neck supple.     Thyroid: No thyromegaly.  Cardiovascular:     Rate and Rhythm: Normal rate and regular rhythm.     Heart sounds: Normal heart sounds. No murmur.  Pulmonary:     Effort: Pulmonary effort is normal. No respiratory distress.     Breath sounds: Normal breath sounds. No wheezing or rales.  Musculoskeletal: Normal range of motion.  Lymphadenopathy:     Cervical: No cervical adenopathy.  Skin:    General: Skin is warm and dry.     Findings: No erythema or rash.  Neurological:  Mental Status: He is alert and oriented to person, place, and time.  Psychiatric:        Behavior: Behavior normal.     Comments: Well groomed, good eye contact, normal speech and thoughts      Diabetic Foot Exam - Simple   Simple Foot Form Diabetic Foot exam was performed with the following findings: Yes 09/17/2018  8:21 AM  Visual Inspection See comments: Yes Sensation Testing Intact to touch and monofilament testing bilaterally: Yes Pulse Check Posterior Tibialis and Dorsalis pulse intact bilaterally: Yes Comments Left great toe s/p amputation. No other deformity.     Results for orders placed or performed in visit on 09/17/18  POCT glycosylated hemoglobin (Hb A1C)  Result Value Ref Range   Hemoglobin A1C 6.5 (A) 4.0 - 5.6 %      Assessment & Plan:   Problem List Items  Addressed This Visit    Essential hypertension    Improved HTN now on thiazide - Home BP readings not available No known complications  OFF Furosemide    Plan:  1. Continue HCTZ 12.5mg  daily for HTN and also likely for edema, continue Lisinopril 30mg  daily 2. Encourage improved lifestyle - low sodium diet, improve regular exercise 3. Continue monitor BP outside office, bring readings to next visit, if persistently >140/90 or new symptoms notify office sooner      Relevant Medications   rosuvastatin (CRESTOR) 10 MG tablet   Hyperlipidemia    Controlled cholesterol on lifestyle Last lipid panel 12/2017 Calculated ASCVD 10 yr risk score elevated at 19%, due to age, HTN, among other risk factors  Plan: 1. Continue Rosuvastatin 10mg  nightly - refilled 2. Continue ASA 81mg  for primary ASCVD risk reduction 3. Encourage improved lifestyle - low carb/cholesterol, reduce portion size, continue improving regular exercise       Relevant Medications   rosuvastatin (CRESTOR) 10 MG tablet   Type 2 diabetes mellitus with other specified complication (HCC) - Primary    Controlled type 2 diabetes, A1c 6.5, stable at goal < 7-8 No hypoglycemia or hyperglycemia Complications - hyperlipidemia, OSA - increases risk of future cardiovascular complications   Plan:  - Not on medication for DM, it is diet controlled - Encourage improved lifestyle - low carb, low sugar diet, reduce portion size, continue improving regular exercise - May continue chronic case management for comprehensive nursing education on diabetes among other benefits - Continue ASA, ACEi, Statin - DM Foot exam done today / Advised to schedule DM ophtho exam, send record - Dr [facility] in [facility]       Relevant Medications   rosuvastatin (CRESTOR) 10 MG tablet   Other Relevant Orders   POCT glycosylated hemoglobin (Hb A1C) (Completed)      Meds ordered this encounter  Medications  . rosuvastatin (CRESTOR) 10 MG tablet    Sig:  Take 1 tablet (10 mg total) by mouth at bedtime.    Dispense:  90 tablet    Refill:  3    Follow up plan: Return in about 3 months (around 12/18/2018) for Annual Physical.   Future labs ordered for 12/2018  [name], [facility] 09/17/2018, 8:12 AM

## 2018-09-17 NOTE — Assessment & Plan Note (Signed)
Controlled type 2 diabetes, A1c 6.5, stable at goal < 7-8 No hypoglycemia or hyperglycemia Complications - hyperlipidemia, OSA - increases risk of future cardiovascular complications   Plan:  - Not on medication for DM, it is diet controlled - Encourage improved lifestyle - low carb, low sugar diet, reduce portion size, continue improving regular exercise - May continue chronic case management for comprehensive nursing education on diabetes among other benefits - Continue ASA, ACEi, Statin - DM Foot exam done today / Advised to schedule DM ophtho exam, send record - Dr Atilano Median in [facility]

## 2018-09-17 NOTE — Patient Instructions (Addendum)
Thank you for coming to the office today.  Recent Labs    01/07/18 0813 05/16/18 1521 09/17/18 0815  HGBA1C 6.6* 6.6* 6.5*   Refilled Rosuvastatin today  Check in with Eye Doctor Dr Atilano Median in [place] - have them send [place] a copy of your Diabetic Eye report  DUE for FASTING BLOOD WORK (no food or drink after midnight before the lab appointment, only water or coffee without cream/sugar on the morning of)  SCHEDULE "Lab Only" visit in the morning at the clinic for lab draw in 3 MONTHS   - Make sure Lab Only appointment is at about 1 week before your next appointment, so that results will be available  For Lab Results, once available within 2-3 days of blood draw, you can can log in to MyChart online to view your results and a brief explanation. Also, we can discuss results at next follow-up visit.   Please schedule a Follow-up Appointment to: Return in about 3 months (around 12/18/2018) for Annual Physical.  If you have any other questions or concerns, please feel free to call the office or send a message through [facility]. You may also schedule an earlier appointment if necessary.  Additionally, you may be receiving a survey about your experience at our office within a few days to 1 week by e-mail or mail. We value your feedback.  [name], DO [place]

## 2018-09-18 ENCOUNTER — Ambulatory Visit
Admission: RE | Admit: 2018-09-18 | Discharge: 2018-09-18 | Disposition: A | Discharge status: Home / Self Care | Payer: PPO | Source: Ambulatory Visit | Attending: Radiation Oncology | Admitting: Radiation Oncology

## 2018-09-18 ENCOUNTER — Other Ambulatory Visit: Payer: Self-pay | Admitting: *Deleted

## 2018-09-18 ENCOUNTER — Ambulatory Visit: Payer: Medicare HMO | Admitting: Radiation Oncology

## 2018-09-18 ENCOUNTER — Telehealth: Payer: Self-pay | Admitting: *Deleted

## 2018-09-18 ENCOUNTER — Encounter: Payer: Self-pay | Admitting: Radiation Oncology

## 2018-09-18 ENCOUNTER — Other Ambulatory Visit: Payer: Self-pay

## 2018-09-18 VITALS — BP 119/68 | HR 71 | Temp 97.4°F | Resp 18 | Wt 243.7 lb

## 2018-09-18 DIAGNOSIS — Z923 Personal history of irradiation: Secondary | ICD-10-CM | POA: Diagnosis not present

## 2018-09-18 DIAGNOSIS — C61 Malignant neoplasm of prostate: Secondary | ICD-10-CM

## 2018-09-18 MED ORDER — LEUPROLIDE ACETATE (6 MONTH) 45 MG IM KIT: 45.0000 mg | PACK | Freq: Once | INTRAMUSCULAR | Status: DC

## 2018-09-18 NOTE — Telephone Encounter (Signed)
Message left notifying patient of appointment for Lupron injection on Tuesday July 7 at 10am.

## 2018-09-18 NOTE — Progress Notes (Signed)
Radiation Oncology Follow up Note  Name: Eric Stone.   Date:   09/18/2018 MRN:  [id number] DOB: 1946-01-01    This 73 y.o. 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  REFERRING PROVIDER: [name] *  HPI: Patient is a 73 year old male now about 4 and half years having completed IMRT radiation therapy to his prostate for Gleason 6 (3+3) adenocarcinoma presenting with a PSA of 4.8.  He had a initial jump in his PSA back in 2019 and had a Lupron injection in May 2019.  His PSA in November 2019 was 1.5 has now recently jumped to 4.8.  He is doing well specifically denies any lower urinary tract symptoms diarrhea or bone pain..  COMPLICATIONS OF TREATMENT: none  FOLLOW UP COMPLIANCE: keeps appointments   PHYSICAL EXAM:  BP 119/68 (BP Location: Left Arm, Patient Position: Sitting)   Pulse 71   Temp (!) 97.4 F (36.3 C) (Tympanic)   Resp 18   Wt 243 lb 11.5 oz (110.6 kg)   BMI 35.99 kg/m  Well-developed well-nourished patient in NAD. HEENT reveals PERLA, EOMI, discs not visualized.  Oral cavity is clear. No oral mucosal lesions are identified. Neck is clear without evidence of cervical or supraclavicular adenopathy. Lungs are clear to A&P. Cardiac examination is essentially unremarkable with regular rate and rhythm without murmur rub or thrill. Abdomen is benign with no organomegaly or masses noted. Motor sensory and DTR levels are equal and symmetric in the upper and lower extremities. Cranial nerves II through XII are grossly intact. Proprioception is intact. No peripheral adenopathy or edema is identified. No motor or sensory levels are noted. Crude visual fields are within normal range.  RADIOLOGY RESULTS: No current films for review  PLAN: At this time of ordered another 44-month Lupron Depot injection and a follow-up in 6 months with a PSA.  I am planning on pulse Lupron  therapy at this time should we see a sided increase trend in his PSA will refer him to medical oncology for other opinion.  Patient is comfortable with her current treatment plan.  I would like to take this opportunity to thank you for allowing me to participate in the care of your patient.Noreene Filbert, MD

## 2018-09-22 ENCOUNTER — Telehealth: Payer: Self-pay

## 2018-09-22 ENCOUNTER — Ambulatory Visit: Payer: Self-pay | Admitting: *Deleted

## 2018-09-22 DIAGNOSIS — E1169 Type 2 diabetes mellitus with other specified complication: Secondary | ICD-10-CM

## 2018-09-22 NOTE — Chronic Care Management (AMB) (Signed)
  Chronic Care Management   Outreach Note  09/22/2018 Name: Eric Stone. MRN: [id number] DOB: 10/05/1945  Referred by: Olin Hauser, DO Reason for referral : Chronic Care Management (Unsuccessful f/u call)   An unsuccessful telephone outreach was attempted today. The patient was referred to the case management team by for assistance with chronic care management and care coordination.   Follow Up Plan: A HIPPA compliant phone message was left for the patient providing contact information and requesting a return call.  The care management team will reach out to the patient again over the next 30 days.   Merlene Morse Leeon Makar RN, BSN Nurse Case Pharmacist, [facility]  [phone]) [employer]

## 2018-09-30 ENCOUNTER — Other Ambulatory Visit: Payer: Self-pay

## 2018-09-30 ENCOUNTER — Inpatient Hospital Stay: Payer: PPO | Attending: Radiation Oncology

## 2018-09-30 DIAGNOSIS — C61 Malignant neoplasm of prostate: Secondary | ICD-10-CM | POA: Insufficient documentation

## 2018-09-30 DIAGNOSIS — Z5111 Encounter for antineoplastic chemotherapy: Secondary | ICD-10-CM | POA: Diagnosis not present

## 2018-09-30 MED ORDER — LEUPROLIDE ACETATE (6 MONTH) 45 MG IM KIT
45.0000 mg | PACK | Freq: Once | INTRAMUSCULAR | Status: AC
  Administered 2018-09-30: 45 mg via INTRAMUSCULAR
  Filled 2018-09-30: qty 45

## 2018-10-13 ENCOUNTER — Telehealth: Payer: Self-pay

## 2018-10-27 ENCOUNTER — Telehealth: Payer: Self-pay

## 2018-10-30 ENCOUNTER — Telehealth: Payer: Self-pay

## 2018-11-08 ENCOUNTER — Other Ambulatory Visit: Payer: Self-pay | Admitting: Family Medicine

## 2018-11-08 DIAGNOSIS — I10 Essential (primary) hypertension: Secondary | ICD-10-CM

## 2018-11-17 ENCOUNTER — Telehealth: Payer: Self-pay

## 2018-11-17 ENCOUNTER — Ambulatory Visit: Payer: Self-pay | Admitting: *Deleted

## 2018-11-17 DIAGNOSIS — E1169 Type 2 diabetes mellitus with other specified complication: Secondary | ICD-10-CM

## 2018-11-17 NOTE — Chronic Care Management (AMB) (Signed)
  Chronic Care Management   Outreach Note  11/17/2018 Name: Eric Stone. MRN: [id number] DOB: Dec 07, 1945  Referred by: Olin Hauser, DO Reason for referral : Chronic Care Management (Unsuccessul Outreach x2 )   A second unsuccessful telephone outreach was attempted today. The patient was referred to the case management team for assistance with chronic care management and care coordination.   Follow Up Plan: A HIPPA compliant phone message was left for the patient providing contact information and requesting a return call.  The care management team will reach out to the patient again over the next 30 days.   Merlene Morse Dietrick Barris RN, BSN Nurse Case Pharmacist, [facility]  [phone]) [employer]

## 2019-01-06 ENCOUNTER — Other Ambulatory Visit: Payer: PPO

## 2019-01-06 ENCOUNTER — Other Ambulatory Visit: Payer: Self-pay

## 2019-01-06 DIAGNOSIS — E1169 Type 2 diabetes mellitus with other specified complication: Secondary | ICD-10-CM

## 2019-01-06 DIAGNOSIS — Z0000 Encounter for general adult medical examination without abnormal findings: Secondary | ICD-10-CM | POA: Diagnosis not present

## 2019-01-06 DIAGNOSIS — I10 Essential (primary) hypertension: Secondary | ICD-10-CM | POA: Diagnosis not present

## 2019-01-06 DIAGNOSIS — E7800 Pure hypercholesterolemia, unspecified: Secondary | ICD-10-CM

## 2019-01-07 LAB — CBC WITH DIFFERENTIAL/PLATELET
Absolute Monocytes: 578 cells/uL (ref 200–950)
Basophils Absolute: 27 cells/uL (ref 0–200)
Basophils Relative: 0.4 %
Eosinophils Absolute: 320 cells/uL (ref 15–500)
Eosinophils Relative: 4.7 %
HCT: 40.3 % (ref 38.5–50.0)
Hemoglobin: 13.9 g/dL (ref 13.2–17.1)
Lymphs Abs: 1537 cells/uL (ref 850–3900)
MCH: 31 pg (ref 27.0–33.0)
MCHC: 34.5 g/dL (ref 32.0–36.0)
MCV: 89.8 fL (ref 80.0–100.0)
MPV: 10.8 fL (ref 7.5–12.5)
Monocytes Relative: 8.5 %
Neutro Abs: 4338 cells/uL (ref 1500–7800)
Neutrophils Relative %: 63.8 %
Platelets: 226 10*3/uL (ref 140–400)
RBC: 4.49 10*6/uL (ref 4.20–5.80)
RDW: 13.2 % (ref 11.0–15.0)
Total Lymphocyte: 22.6 %
WBC: 6.8 10*3/uL (ref 3.8–10.8)

## 2019-01-07 LAB — COMPLETE METABOLIC PANEL WITH GFR
AG Ratio: 1.6 (calc) (ref 1.0–2.5)
ALT: 17 U/L (ref 9–46)
AST: 17 U/L (ref 10–35)
Albumin: 4.1 g/dL (ref 3.6–5.1)
Alkaline phosphatase (APISO): 62 U/L (ref 35–144)
BUN: 15 mg/dL (ref 7–25)
CO2: 31 mmol/L (ref 20–32)
Calcium: 9.5 mg/dL (ref 8.6–10.3)
Chloride: 103 mmol/L (ref 98–110)
Creat: 1 mg/dL (ref 0.70–1.18)
GFR, Est African American: 87 mL/min/{1.73_m2} (ref >=60)
GFR, Est Non African American: 75 mL/min/{1.73_m2} (ref >=60)
Globulin: 2.5 g/dL (calc) (ref 1.9–3.7)
Glucose, Bld: 119 mg/dL — ABNORMAL HIGH (ref 65–99)
Potassium: 4.4 mmol/L (ref 3.5–5.3)
Sodium: 141 mmol/L (ref 135–146)
Total Bilirubin: 0.6 mg/dL (ref 0.2–1.2)
Total Protein: 6.6 g/dL (ref 6.1–8.1)

## 2019-01-07 LAB — LIPID PANEL
Cholesterol: 133 mg/dL (ref <200)
HDL: 45 mg/dL (ref >=40)
LDL Cholesterol (Calc): 67 mg/dL (calc)
Non-HDL Cholesterol (Calc): 88 mg/dL (calc) (ref <130)
Total CHOL/HDL Ratio: 3 (calc) (ref <5.0)
Triglycerides: 133 mg/dL (ref <150)

## 2019-01-07 LAB — HEMOGLOBIN A1C
Hgb A1c MFr Bld: 6.6 % of total Hgb — ABNORMAL HIGH (ref <5.7)
Mean Plasma Glucose: 143 (calc)
eAG (mmol/L): 7.9 (calc)

## 2019-01-12 ENCOUNTER — Telehealth: Payer: Self-pay

## 2019-01-13 ENCOUNTER — Other Ambulatory Visit: Payer: Self-pay

## 2019-01-13 ENCOUNTER — Encounter: Payer: Self-pay | Admitting: Family Medicine

## 2019-01-13 ENCOUNTER — Ambulatory Visit (INDEPENDENT_AMBULATORY_CARE_PROVIDER_SITE_OTHER): Payer: PPO | Admitting: Family Medicine

## 2019-01-13 ENCOUNTER — Ambulatory Visit (INDEPENDENT_AMBULATORY_CARE_PROVIDER_SITE_OTHER): Payer: PPO

## 2019-01-13 VITALS — BP 132/76 | HR 57 | Temp 98.6°F | Resp 18 | Ht 69.0 in | Wt 250.8 lb

## 2019-01-13 VITALS — BP 156/72 | HR 57 | Temp 98.6°F | Resp 16 | Ht 69.0 in | Wt 250.8 lb

## 2019-01-13 DIAGNOSIS — Z1211 Encounter for screening for malignant neoplasm of colon: Secondary | ICD-10-CM

## 2019-01-13 DIAGNOSIS — E785 Hyperlipidemia, unspecified: Secondary | ICD-10-CM

## 2019-01-13 DIAGNOSIS — Z23 Encounter for immunization: Secondary | ICD-10-CM | POA: Diagnosis not present

## 2019-01-13 DIAGNOSIS — E1169 Type 2 diabetes mellitus with other specified complication: Secondary | ICD-10-CM | POA: Diagnosis not present

## 2019-01-13 DIAGNOSIS — Z0000 Encounter for general adult medical examination without abnormal findings: Secondary | ICD-10-CM

## 2019-01-13 DIAGNOSIS — I10 Essential (primary) hypertension: Secondary | ICD-10-CM | POA: Diagnosis not present

## 2019-01-13 DIAGNOSIS — Z9989 Dependence on other enabling machines and devices: Secondary | ICD-10-CM | POA: Diagnosis not present

## 2019-01-13 DIAGNOSIS — G4733 Obstructive sleep apnea (adult) (pediatric): Secondary | ICD-10-CM | POA: Diagnosis not present

## 2019-01-13 DIAGNOSIS — C61 Malignant neoplasm of prostate: Secondary | ICD-10-CM

## 2019-01-13 MED ORDER — LISINOPRIL 30 MG PO TABS: 30.0000 mg | ORAL_TABLET | Freq: Every day | ORAL | 3 refills | Status: DC

## 2019-01-13 NOTE — Assessment & Plan Note (Signed)
Controlled - Home BP readings not available No known complications  OFF Furosemide    Plan:  1. Continue HCTZ 12.5mg  daily for HTN and also likely for edema, continue Lisinopril 30mg  daily 2. Encourage improved lifestyle - low sodium diet, improve regular exercise 3. Continue monitor BP outside office, bring readings to next visit, if persistently >140/90 or new symptoms notify office sooner

## 2019-01-13 NOTE — Assessment & Plan Note (Signed)
Controlled cholesterol on lifestyle Last lipid panel 12/2018 Calculated ASCVD 10 yr risk score elevated at 19%, due to age, HTN, among other risk factors  Plan: 1. Continue Rosuvastatin 10mg nightly 2. Continue ASA 81mg for primary ASCVD risk reduction 3. Encourage improved lifestyle - low carb/cholesterol, reduce portion size, continue improving regular exercise 

## 2019-01-13 NOTE — Progress Notes (Signed)
Subjective:   Eric Stone. is a 73 y.o. male who presents for Medicare Annual/Subsequent preventive examination.  Review of Systems:   Cardiac Risk Factors include: diabetes mellitus;dyslipidemia;hypertension;advanced age (>60men, >4 women);male gender;obesity (BMI >30kg/m2)     Objective:    Vitals: BP (!) 156/72 (BP Location: Left Arm, Patient Position: Sitting, Cuff Size: Normal)   Pulse (!) 57   Temp 98.6 F (37 C) (Oral)   Resp 16   Ht 5\' 9"  (1.753 m)   Wt 250 lb 12.8 oz (113.8 kg)   SpO2 99%   BMI 37.04 kg/m   Body mass index is 37.04 kg/m.  Advanced Directives 09/18/2018 02/05/2018 01/07/2018 08/08/2017 12/11/2016 08/09/2016 02/09/2016  Does Patient Have a Medical Advance Directive? No No Yes No Yes No No  Type of Advance Directive - - Living will;Healthcare Power of [place];Living will - -  Copy of Hope in Chart? - - No - copy requested - No - copy requested - -  Would patient like information on creating a medical advance directive? No - Patient declined No - Patient declined - No - Patient declined - No - Patient declined No - patient declined information  Pre-existing out of facility DNR order (yellow form or pink MOST form) - - - - - - -    Tobacco Social History   Tobacco Use  Smoking Status Former Smoker  . Packs/day: 1.00  . Years: 26.00  . Pack years: 26.00  . Types: Cigarettes  . Quit date: 03/26/1989  . Years since quitting: 29.8  Smokeless Tobacco Former Systems developer  Tobacco Comment   less than 1 PPD     Counseling given: Not Answered Comment: less than 1 PPD   Clinical Intake:  Pre-visit preparation completed: Yes  Pain : No/denies pain     Nutritional Status: BMI > 30  Obese Nutritional Risks: None Diabetes: Yes CBG done?: No Did pt. bring in CBG monitor from home?: No  How often do you need to have someone help you when you read instructions, pamphlets, or other written materials  from your doctor or pharmacy?: 1 - Never  Nutrition Risk Assessment:  Has the patient had any N/V/D within the last 2 months?  No  Does the patient have any non-healing wounds?  No  Has the patient had any unintentional weight loss or weight gain?  No   Diabetes:  Is the patient diabetic?  Yes  If diabetic, was a CBG obtained today?  No  Did the patient bring in their glucometer from home?  No  How often do you monitor your CBG's? Doesn't check at home .   Financial Strains and Diabetes Management:  Are you having any financial strains with the device, your supplies or your medication? No .  Does the patient want to be seen by Chronic Care Management for management of their diabetes?  Yes  Would the patient like to be referred to a Nutritionist or for Diabetic Management?  Yes   Already in contact with CCM team   Diabetic Exams:  Diabetic Eye Exam: Overdue for diabetic eye exam. Pt has been advised about the importance in completing this exam. Scheduled for tomorrow.   Diabetic Foot Exam: Completed 09/17/2018. Pt has been advised about the importance in completing this exam.   Interpreter Needed?: No  Information entered by ::  ,LPN  Past Medical History:  Diagnosis Date  . Arthritis   . H/O blood  clots   . H/O blood clots    in left leg in 1988  . Rash    occasionally and pt does see a dermatologist  . Sleep apnea    uses a CPAP;sleep study >38yrs ago   Past Surgical History:  Procedure Laterality Date  . TOE SURGERY     melanoma  . 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 VIDEO ASSISTED THORACOSCOPY (VATS)/DECORTICATION Left 05/06/2012   Procedure: VIDEO ASSISTED THORACOSCOPY (VATS) drainage loculated pleural effusion;  Surgeon: Grace Isaac, MD;  Location: [facility];  Service: Thoracic;  Laterality: Left;  Marland Kitchen VIDEO BRONCHOSCOPY N/A 05/06/2012   Procedure: VIDEO BRONCHOSCOPY;  Surgeon: Grace Isaac, MD;  Location: [facility] OR;  Service: Thoracic;   Laterality: N/A;   Family History  Problem Relation Age of Onset  . Hypertension Mother   . Stroke Mother        possible   Social History   Socioeconomic History  . Marital status: Married    Spouse name: Not on file  . Number of children: Not on file  . Years of education: tech school  . Highest education level: High school graduate  Occupational History  . Occupation: retired  Scientific laboratory technician  . Financial resource strain: Not hard at all  . Food insecurity    Worry: Never true    Inability: Never true  . Transportation needs    Medical: No    Non-medical: No  Tobacco Use  . Smoking status: Former Smoker    Packs/day: 1.00    Years: 26.00    Pack years: 26.00    Types: Cigarettes    Quit date: 03/26/1989    Years since quitting: 29.8  . Smokeless tobacco: Former Systems developer  . Tobacco comment: less than 1 PPD  Substance and Sexual Activity  . Alcohol use: No    Alcohol/week: 0.0 standard drinks  . Drug use: No  . Sexual activity: Yes  Lifestyle  . Physical activity    Days per week: 0 days    Minutes per session: 0 min  . Stress: Not at all  Relationships  . Social connections    Talks on phone: More than three times a week    Gets together: More than three times a week    Attends religious service: More than 4 times per year    Active member of club or organization: No    Attends meetings of clubs or organizations: Never    Relationship status: Married  Other Topics Concern  . Not on file  Social History Narrative  . Not on file    Outpatient Encounter Medications as of 01/13/2019  Medication Sig  . aspirin EC 81 MG tablet Take 81 mg by mouth.  . hydrochlorothiazide (HYDRODIURIL) 12.5 MG tablet Take 1 tablet by mouth once daily  . lisinopril (PRINIVIL,ZESTRIL) 30 MG tablet Take 1 tablet (30 mg total) by mouth daily. Take 1, 30 mg. Tab each day.  . pimecrolimus (ELIDEL) 1 % cream APPLY TO AFFECTED AREA GROIN TWICE DAILY AS DIRECTED  . rosuvastatin (CRESTOR) 10  MG tablet Take 1 tablet (10 mg total) by mouth at bedtime.   No facility-administered encounter medications on file as of 01/13/2019.     Activities of Daily Living In your present state of health, do you have any difficulty performing the following activities: 01/13/2019  Hearing? N  Comment no hearing aids  Vision? N  Comment reading glasses, goes to Dr. Atilano Median  Difficulty  concentrating or making decisions? Y  Comment comes back  Walking or climbing stairs? N  Dressing or bathing? N  Doing errands, shopping? N  Preparing Food and eating ? N  Using the Toilet? N  In the past six months, have you accidently leaked urine? N  Do you have problems with loss of bowel control? N  Managing your Medications? N  Managing your Finances? N  Housekeeping or managing your Housekeeping? N  Some recent data might be hidden    Patient Care Team: Olin Hauser, DO as PCP - General (Family Medicine) Luan Pulling, Ronelle Nigh., MD (Inactive) (Unknown Physician Specialty) Grace Isaac, MD as Consulting Physician (Cardiothoracic Surgery) Clance, Armando Reichert, MD as Consulting Physician (Pulmonary Disease) Grace Isaac, MD as Consulting Physician (Cardiothoracic Surgery) Clance, Armando Reichert, MD as Consulting Physician (Pulmonary Disease) [place], Virl Diamond, [facility] as Pharmacist Minor, Dalbert Garnet, RN as Case Manager   Assessment:   This is a routine wellness examination for Christophe.  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 "I want to work on my diabetes"  (pt-stated)     Current Barriers:  Marland Kitchen Knowledge Deficits related to basic Diabetes pathophysiology and self care/management  Case Manager Clinical Goal(s):  Over the next 60  days, patient will demonstrate improved adherence to prescribed treatment plan for diabetes self  care/management as evidenced by:  . adherence to ADA/ carb modified diet  Interventions:  . Reviewed strategies to reduce carbs in patient's diet . 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 Patient has a recheck on labs end of June.  . Will plan to f/u with patient after labs   Patient Self Care Activities:  . UNABLE to independently verbalize understanding of diabetic diet . Attends all scheduled provider appointments  Please see past updates related to this goal by clicking on the "Past Updates" button in the selected goal       . DIET - INCREASE WATER INTAKE     Recommend drinking at least 6-8 glasses of water a day     . Exercise 3x per week (30 min per time)     Will increase execise and eat more veggies.        Fall Risk Fall Risk  01/13/2019 05/16/2018 01/14/2018 01/07/2018 10/21/2017  Falls in the past year? 0 0 No No No  Number falls in past yr: 0 - - - -  Injury with Fall? 0 - - - -  Follow up - Falls evaluation completed - - -   FALL RISK PREVENTION PERTAINING TO THE HOME:  Any stairs in or around the home? Yes  If so, are there any without handrails? No   Home free of loose throw rugs in walkways, pet beds, electrical cords, etc? Yes  Adequate lighting in your home to reduce risk of falls? Yes   ASSISTIVE DEVICES UTILIZED TO PREVENT FALLS:  Life alert? No  Use of a cane, walker or w/c? No  Grab bars in the bathroom? Yes  Shower chair or bench in shower? No  Elevated toilet seat or a handicapped toilet? No    TIMED UP AND GO:  Was the test performed? Yes .  Length of time to ambulate 10 feet: 10 sec.   GAIT:  Appearance of gait: Gait steady and  fast without the use of an assistive device. Education: Fall risk prevention has been discussed.  Intervention(s) required? No   DME/home health order needed?  No    Depression Screen PHQ 2/9 Scores 01/13/2019 05/16/2018 01/14/2018 01/07/2018  PHQ - 2 Score 0 0 0  0    Cognitive Function MMSE - Mini Mental State Exam 12/31/2014  Orientation to time 5  Orientation to Place 5  Registration 3  Attention/ Calculation 5  Recall 3  Language- name 2 objects 2  Language- repeat 1  Language- follow 3 step command 3  Language- read & follow direction 1  Write a sentence 1  Copy design 1  Total score 30     6CIT Screen 01/13/2019 01/07/2018 12/11/2016  What Year? 0 points 0 points 0 points  What month? 0 points 0 points 0 points  What time? 0 points 0 points 0 points  Count back from 20 0 points 0 points 0 points  Months in reverse 0 points 0 points 0 points  Repeat phrase 0 points 0 points 0 points  Total Score 0 0 0    Immunization History  Administered Date(s) Administered  . Influenza, High Dose Seasonal PF 12/31/2014, 12/11/2016, 01/07/2018  . Influenza-Unspecified 02/21/2011  . Pneumococcal Conjugate-13 01/07/2014  . Pneumococcal Polysaccharide-23 03/26/2012  . Tdap 06/07/2014  . Zoster 03/26/2010  . Zoster Recombinat (Shingrix) 08/12/2017, 10/31/2017    Qualifies for Shingles Vaccine? Shingrix completed   Tdap: up to date   Flu Vaccine: Due for Flu vaccine. Does the patient want to receive this vaccine today?  Yes .   Pneumococcal Vaccine: up to date   Screening Tests Health Maintenance  Topic Date Due  . OPHTHALMOLOGY EXAM  05/09/1955  . INFLUENZA VACCINE  10/25/2018  . HEMOGLOBIN A1C  07/07/2019  . FOOT EXAM  09/17/2019  . COLONOSCOPY  12/25/2022  . TETANUS/TDAP  06/06/2024  . Hepatitis C Screening  Completed  . PNA vac Low Risk Adult  Completed   Cancer Screenings:  Colorectal Screening: up to date   Lung Cancer Screening: (Low Dose CT Chest recommended if Age 72-80 years, 30 pack-year currently smoking OR have quit w/in 15years.) does not qualify.     Additional Screening:  Hepatitis C Screening: does qualify; Completed 12/26/2016  Dental Screening: Recommended annual dental exams for proper oral hygiene   Community Resource Referral:  CRR required this visit?  No        Plan:  I have personally reviewed and addressed the Medicare Annual Wellness questionnaire and have noted the following in the patient's chart:  A. Medical and social history B. Use of alcohol, tobacco or illicit drugs  C. Current medications and supplements D. Functional ability and status E.  Nutritional status F.  Physical activity G. Advance directives H. List of other physicians I.  Hospitalizations, surgeries, and ER visits in previous 12 months J.  [place] such as hearing and vision if needed, cognitive and depression L. Referrals and appointments   In addition, I have reviewed and discussed with patient certain preventive protocols, quality metrics, and best practice recommendations. A written personalized care plan for preventive services as well as general preventive health recommendations were provided to patient.   Signed,   Bevelyn Ngo, LPN  [id number] Nurse Health Advisor  Nurse Notes: none

## 2019-01-13 NOTE — Patient Instructions (Addendum)
Thank you for coming to the office today.  Keep up the great work overall.  Make sure you give the card with our fax # to Dr Atilano Median for your diabetic eye exam to be sent to [place].  Flu shot was given today.  Please schedule a Follow-up Appointment to: Return in about 6 months (around 07/14/2019) for 6 month DM A1c, HTN.  If you have any other questions or concerns, please feel free to call the office or send a message through [place]. You may also schedule an earlier appointment if necessary.  Additionally, you may be receiving a survey about your experience at our office within a few days to 1 week by e-mail or mail. We value your feedback.  [name], DO [place]

## 2019-01-13 NOTE — Progress Notes (Signed)
Subjective:    Patient ID: Eric Daft., male    DOB: [birth year], 73 y.o.   MRN: [id number]  Eric Stone. is a 73 y.o. male presenting on 01/13/2019 for Annual Exam   HPI   Here for Annual Physical and Lab Review. Already seen today by Tyler Aas LPN for AMW.  CHRONIC HTN: Improved BP readings. Current Meds - Lisinopril 30mg  daily, HCTZ 12.5mg  daily Reports good compliance, took meds today. Tolerating well, w/o complaints. - Admits mild varicose veins edema, but improvedon compression stocking Denies CP, dyspnea, HA, dizziness / lightheadedness  Type 2 Diabetes Lab result A1c 6.6, stable from previous 6.5-6.6 CBGs Never checked. Meds: neveron meds Currently on ACEi Lifestyle: - Diet (Trying to improvediet, healthier food choices lower carbs, smaller portions, inc water less soda) - Exercise (Improved activity and exercise) - S/p L Great Toe Amputation Due DM Eye Exam - has scheduled w/ Dr Atilano Median in [facility] for tomorrow 01/14/19, will request report. Denies hypoglycemia, polyuria, visual changes, numbness or tingling.  HYPERLIPIDEMIA: - Reports no concerns. Last lipid panel 12/2018, controlled  - Currently taking Rosuvastatin 10mg  daily, tolerating well without side effects or myalgias - Needs refill  OSA, on CPAP - Patient reports prior history of dx OSA and on CPAP for >20 years, prior to treatment his initial symptoms were snoring, daytime sleepiness and fatigue, he has had several sleep studies in the past. Most recent 07-21-2014, with AHI 6.8 per hour, and mild to moderate OSA. - He reports that his sleep apnea is well controlled. He uses the CPAP machine every night. He tolerates the machine well, and thinks that he sleeps better with it and feels good. No new concerns or symptoms.  PMH -Melanoma, Left big toe / History of Skin Cancer S/p amputation - followed by [facility] - Prostate CA, s/p radiation therapy - followed closely by Rad Onc Dr Baruch Gouty for PSA  monitoring, had lower PSA now q 6 month testing > he is asking what goal PSA is. Last checked 07/2017  Health Maintenance: Completed FLu Vaccine today - Shingrix UTD x 2 doses at pharmacy - Last Colon CA - Colonoscopy 2014, reported. Do not have record or report available to me at this time. He would like to proceed with Cologuard now 6 years later. Asymptomatic. - UTD TDap 2016 - Completed routine Hep C screening, Negative 12/2016 - UTD Pneumonia vaccine, received Pneumovax-23 at age 77 (2014), and then next dose was Prevnar-13 12/2013, has received both after age 45, therefore completed  Depression screen [facility] 2/9 01/13/2019 01/13/2019 05/16/2018  Decreased Interest 0 0 0  Down, Depressed, Hopeless 0 0 0  PHQ - 2 Score 0 0 0  Altered sleeping 0 - -  Tired, decreased energy 0 - -  Change in appetite 0 - -  Feeling bad or failure about yourself  0 - -  Trouble concentrating 0 - -  Moving slowly or fidgety/restless 0 - -  Suicidal thoughts 0 - -  PHQ-9 Score 0 - -    Past Medical History:  Diagnosis Date  . Arthritis   . H/O blood clots   . H/O blood clots    in left leg in 1988  . Rash    occasionally and pt does see a dermatologist  . Sleep apnea    uses a CPAP;sleep study >49yrs ago   Past Surgical History:  Procedure Laterality Date  . TOE SURGERY     melanoma  . TOTAL KNEE  ARTHROPLASTY  2009-2010   bilateral  . VIDEO ASSISTED THORACOSCOPY (VATS)/DECORTICATION Left 05/06/2012   Procedure: VIDEO ASSISTED THORACOSCOPY (VATS) drainage loculated pleural effusion;  Surgeon: Grace Isaac, MD;  Location: [facility];  Service: Thoracic;  Laterality: Left;  Marland Kitchen VIDEO BRONCHOSCOPY N/A 05/06/2012   Procedure: VIDEO BRONCHOSCOPY;  Surgeon: Grace Isaac, MD;  Location: [facility] OR;  Service: Thoracic;  Laterality: N/A;   Social History   Socioeconomic History  . Marital status: Married    Spouse name: Not on file  . Number of children: Not on file  . Years of education: tech school   . Highest education level: High school graduate  Occupational History  . Occupation: retired  Scientific laboratory technician  . Financial resource strain: Not hard at all  . Food insecurity    Worry: Never true    Inability: Never true  . Transportation needs    Medical: No    Non-medical: No  Tobacco Use  . Smoking status: Former Smoker    Packs/day: 1.00    Years: 26.00    Pack years: 26.00    Types: Cigarettes    Quit date: 03/26/1989    Years since quitting: 29.8  . Smokeless tobacco: Former Systems developer  . Tobacco comment: less than 1 PPD  Substance and Sexual Activity  . Alcohol use: No    Alcohol/week: 0.0 standard drinks  . Drug use: No  . Sexual activity: Yes  Lifestyle  . Physical activity    Days per week: 0 days    Minutes per session: 0 min  . Stress: Not at all  Relationships  . Social connections    Talks on phone: More than three times a week    Gets together: More than three times a week    Attends religious service: More than 4 times per year    Active member of club or organization: No    Attends meetings of clubs or organizations: Never    Relationship status: Married  . Intimate partner violence    Fear of current or ex partner: No    Emotionally abused: No    Physically abused: No    Forced sexual activity: No  Other Topics Concern  . Not on file  Social History Narrative  . Not on file   Family History  Problem Relation Age of Onset  . Hypertension Mother   . Stroke Mother        possible   Current Outpatient Medications on File Prior to Visit  Medication Sig  . aspirin EC 81 MG tablet Take 81 mg by mouth.  . hydrochlorothiazide (HYDRODIURIL) 12.5 MG tablet Take 1 tablet by mouth once daily  . pimecrolimus (ELIDEL) 1 % cream APPLY TO AFFECTED AREA GROIN TWICE DAILY AS DIRECTED  . rosuvastatin (CRESTOR) 10 MG tablet Take 1 tablet (10 mg total) by mouth at bedtime.   No current facility-administered medications on file prior to visit.     Review of Systems   Constitutional: Negative for activity change, appetite change, chills, diaphoresis, fatigue and fever.  HENT: Negative for congestion and hearing loss.   Eyes: Negative for visual disturbance.  Respiratory: Negative for apnea (on CPAP), cough, chest tightness, shortness of breath and wheezing.   Cardiovascular: Negative for chest pain, palpitations and leg swelling.  Gastrointestinal: Negative for abdominal pain, anal bleeding, blood in stool, constipation, diarrhea, nausea and vomiting.  Endocrine: Negative for cold intolerance.  Genitourinary: Negative for difficulty urinating, dysuria, frequency and hematuria.  Musculoskeletal: Negative  for arthralgias, back pain and neck pain.  Skin: Negative for rash.  Allergic/Immunologic: Negative for environmental allergies.  Neurological: Negative for dizziness, weakness, light-headedness, numbness and headaches.  Hematological: Negative for adenopathy.  Psychiatric/Behavioral: Negative for behavioral problems, dysphoric mood and sleep disturbance. The patient is not nervous/anxious.    Per HPI unless specifically indicated above     Objective:    BP 132/76 (BP Location: Left Arm, Cuff Size: Normal)   Pulse (!) 57   Temp 98.6 F (37 C) (Oral)   Resp 18   Ht 5\' 9"  (1.753 m)   Wt 250 lb 12.8 oz (113.8 kg)   SpO2 99%   BMI 37.04 kg/m   Wt Readings from Last 3 Encounters:  01/13/19 250 lb 12.8 oz (113.8 kg)  01/13/19 250 lb 12.8 oz (113.8 kg)  09/18/18 243 lb 11.5 oz (110.6 kg)    Physical Exam Vitals signs and nursing note reviewed.  Constitutional:      General: He is not in acute distress.    Appearance: He is well-developed. He is not diaphoretic.     Comments: Well-appearing, comfortable, cooperative  HENT:     Head: Normocephalic and atraumatic.  Eyes:     General:        Right eye: No discharge.        Left eye: No discharge.     Conjunctiva/sclera: Conjunctivae normal.     Pupils: Pupils are equal, round, and reactive to  light.  Neck:     Musculoskeletal: Normal range of motion and neck supple.     Thyroid: No thyromegaly.     Comments: No carotid bruits Cardiovascular:     Rate and Rhythm: Normal rate and regular rhythm.     Heart sounds: Normal heart sounds. No murmur.  Pulmonary:     Effort: Pulmonary effort is normal. No respiratory distress.     Breath sounds: Normal breath sounds. No wheezing or rales.  Abdominal:     General: Bowel sounds are normal. There is no distension.     Palpations: Abdomen is soft. There is no mass.     Tenderness: There is no abdominal tenderness.  Musculoskeletal: Normal range of motion.        General: No tenderness.     Comments: Upper / Lower Extremities: - Normal muscle tone, strength bilateral upper extremities 5/5, lower extremities 5/5  S/p L great toe amputation  Lymphadenopathy:     Cervical: No cervical adenopathy.  Skin:    General: Skin is warm and dry.     Findings: No erythema or rash.  Neurological:     Mental Status: He is alert and oriented to person, place, and time.     Comments: Distal sensation intact to light touch all extremities  Psychiatric:        Behavior: Behavior normal.     Comments: Well groomed, good eye contact, normal speech and thoughts    Recent Labs    05/16/18 1521 09/17/18 0815 01/06/19 0801  HGBA1C 6.6* 6.5* 6.6*     Results for orders placed or performed in visit on 01/06/19  Lipid panel  Result Value Ref Range   Cholesterol 133 <200 mg/dL   HDL 45 > OR = 40 mg/dL   Triglycerides 133 <150 mg/dL   LDL Cholesterol (Calc) 67 mg/dL (calc)   Total CHOL/HDL Ratio 3.0 <5.0 (calc)   Non-HDL Cholesterol (Calc) 88 <130 mg/dL (calc)  COMPLETE METABOLIC PANEL WITH GFR  Result Value Ref Range  Glucose, Bld 119 (H) 65 - 99 mg/dL   BUN 15 7 - 25 mg/dL   Creat 1.00 0.70 - 1.18 mg/dL   GFR, Est Non African American 75 > OR = 60 mL/min/1.27m2   GFR, Est African American 87 > OR = 60 mL/min/1.27m2   BUN/Creatinine  Ratio NOT APPLICABLE 6 - 22 (calc)   Sodium 141 135 - 146 mmol/L   Potassium 4.4 3.5 - 5.3 mmol/L   Chloride 103 98 - 110 mmol/L   CO2 31 20 - 32 mmol/L   Calcium 9.5 8.6 - 10.3 mg/dL   Total Protein 6.6 6.1 - 8.1 g/dL   Albumin 4.1 3.6 - 5.1 g/dL   Globulin 2.5 1.9 - 3.7 g/dL (calc)   AG Ratio 1.6 1.0 - 2.5 (calc)   Total Bilirubin 0.6 0.2 - 1.2 mg/dL   Alkaline phosphatase (APISO) 62 35 - 144 U/L   AST 17 10 - 35 U/L   ALT 17 9 - 46 U/L  CBC with Differential/Platelet  Result Value Ref Range   WBC 6.8 3.8 - 10.8 Thousand/uL   RBC 4.49 4.20 - 5.80 Million/uL   Hemoglobin 13.9 13.2 - 17.1 g/dL   HCT 40.3 38.5 - 50.0 %   MCV 89.8 80.0 - 100.0 fL   MCH 31.0 27.0 - 33.0 pg   MCHC 34.5 32.0 - 36.0 g/dL   RDW 13.2 11.0 - 15.0 %   Platelets 226 140 - 400 Thousand/uL   MPV 10.8 7.5 - 12.5 fL   Neutro Abs 4,338 1,500 - 7,800 cells/uL   Lymphs Abs 1,537 850 - 3,900 cells/uL   Absolute Monocytes 578 200 - 950 cells/uL   Eosinophils Absolute 320 15 - 500 cells/uL   Basophils Absolute 27 0 - 200 cells/uL   Neutrophils Relative % 63.8 %   Total Lymphocyte 22.6 %   Monocytes Relative 8.5 %   Eosinophils Relative 4.7 %   Basophils Relative 0.4 %  Hemoglobin A1c  Result Value Ref Range   Hgb A1c MFr Bld 6.6 (H) <5.7 % of total Hgb   Mean Plasma Glucose 143 (calc)   eAG (mmol/L) 7.9 (calc)      Assessment & Plan:   Problem List Items Addressed This Visit    Type 2 diabetes mellitus with other specified complication (HCC)    Controlled type 2 diabetes, A1c 6.6, stable at goal < 7-8 No hypoglycemia or hyperglycemia Complications - hyperlipidemia, OSA - increases risk of future cardiovascular complications   Plan:  - Not on medication for DM, it is diet controlled - Encourage improved lifestyle - low carb, low sugar diet, reduce portion size, continue improving regular exercise - May continue chronic case management for comprehensive nursing education on diabetes among other  benefits - Continue ASA, ACEi, Statin Advised to schedule DM ophtho exam, send record - Dr Atilano Median in [place] - apt is tomorrow 10/21 - request record       Relevant Medications   lisinopril (ZESTRIL) 30 MG tablet   OSA on CPAP    Well controlled, chronic mild-mod OSA on CPAP - Good adherence to CPAP nightly - Continue current CPAP therapy, patient is benefiting from therapy      Malignant neoplasm of prostate ([place])    Followed by Dr Esperanza Heir Onc S/p rad therapy, no surgical removal Stable monitoring PSA per Oncology - already had result from 08/2018 slight elevation >4, now due in 03/2019, we did not opt to check PSA at this visit due to  it already being on q 6 month surveillance, patient agrees with this plan he will let us know if we need to add PSA      Hyperlipidemia associated with type 2 diabetes mellitus ([place])    Controlled cholesterol on lifestyle Last lipid panel 12/2018 Calculated ASCVD 10 yr risk score elevated at 19%, due to age, HTN, among other risk factors  Plan: 1. Continue Rosuvastatin 10mg  nightly 2. Continue ASA 81mg  for primary ASCVD risk reduction 3. Encourage improved lifestyle - low carb/cholesterol, reduce portion size, continue improving regular exercise      Relevant Medications   lisinopril (ZESTRIL) 30 MG tablet   Essential hypertension    Controlled - Home BP readings not available No known complications  OFF Furosemide    Plan:  1. Continue HCTZ 12.5mg  daily for HTN and also likely for edema, continue Lisinopril 30mg  daily 2. Encourage improved lifestyle - low sodium diet, improve regular exercise 3. Continue monitor BP outside office, bring readings to next visit, if persistently >140/90 or new symptoms notify office sooner      Relevant Medications   lisinopril (ZESTRIL) 30 MG tablet    Other Visit Diagnoses    Annual physical exam    -  Primary   Screening for colon cancer       Relevant Orders   Cologuard       Updated Health  Maintenance information Flu shot already today Reviewed recent lab results with patient Encouraged improvement to lifestyle with diet and exercise - Goal of weight loss  Due for routine colon cancer screening. Last colonoscopy 6 years ago. Agrees to check cologuard in between. - Discussion today about recommendations for Cologuard screening, benefits and risks of screening, interested in Cologuard, understands that if positive then recommendation is for diagnostic colonoscopy to follow-up. - Ordered Cologuard today   Meds ordered this encounter  Medications  . lisinopril (ZESTRIL) 30 MG tablet    Sig: Take 1 tablet (30 mg total) by mouth daily.    Dispense:  90 tablet    Refill:  3    Keep refills on file, if patient is not ready    Follow up plan: Return in about 6 months (around 07/14/2019) for 6 month DM A1c, HTN.  [name], [facility] 01/13/2019, 9:25 AM

## 2019-01-13 NOTE — Assessment & Plan Note (Signed)
Controlled type 2 diabetes, A1c 6.6, stable at goal < 7-8 No hypoglycemia or hyperglycemia Complications - hyperlipidemia, OSA - increases risk of future cardiovascular complications   Plan:  - Not on medication for DM, it is diet controlled - Encourage improved lifestyle - low carb, low sugar diet, reduce portion size, continue improving regular exercise - May continue chronic case management for comprehensive nursing education on diabetes among other benefits - Continue ASA, ACEi, Statin Advised to schedule DM ophtho exam, send record - Dr Atilano Median in [place] - apt is tomorrow 10/21 - request record

## 2019-01-13 NOTE — Patient Instructions (Signed)
Eric Stone , Thank you for taking time to come for your Medicare Wellness Visit. I appreciate your ongoing commitment to your health goals. Please review the following plan we discussed and let me know if I can assist you in the future.   Screening recommendations/referrals: Colonoscopy: completed 2014 Recommended yearly ophthalmology/optometry visit for glaucoma screening and checkup Recommended yearly dental visit for hygiene and checkup  Vaccinations: Influenza vaccine: done today  Pneumococcal vaccine: up to date Tdap vaccine: up to date Shingles vaccine: shingrix eligible     Advanced directives: Please bring a copy of your health care power of attorney and living will to the office at your convenience.  Conditions/risks identified: pre-diabetic-already in contact with chronic care management team   Next appointment: Follow up in one year for your annual wellness visit.   Preventive Care 71 Years and Older, Male Preventive care refers to lifestyle choices and visits with your health care provider that can promote health and wellness. What does preventive care include?  A yearly physical exam. This is also called an annual well check.  Dental exams once or twice a year.  Routine eye exams. Ask your health care provider how often you should have your eyes checked.  Personal lifestyle choices, including:  Daily care of your teeth and gums.  Regular physical activity.  Eating a healthy diet.  Avoiding tobacco and drug use.  Limiting alcohol use.  Practicing safe sex.  Taking low doses of aspirin every day.  Taking vitamin and mineral supplements as recommended by your health care provider. What happens during an annual well check? The services and screenings done by your health care provider during your annual well check will depend on your age, overall health, lifestyle risk factors, and family history of disease. Counseling  Your health care provider may ask  you questions about your:  Alcohol use.  Tobacco use.  Drug use.  Emotional well-being.  Home and relationship well-being.  Sexual activity.  Eating habits.  History of falls.  Memory and ability to understand (cognition).  Work and work Statistician. Screening  You may have the following tests or measurements:  Height, weight, and BMI.  Blood pressure.  Lipid and cholesterol levels. These may be checked every 5 years, or more frequently if you are over 5 years old.  Skin check.  Lung cancer screening. You may have this screening every year starting at age 23 if you have a 30-pack-year history of smoking and currently smoke or have quit within the past 15 years.  Fecal occult blood test (FOBT) of the stool. You may have this test every year starting at age 28.  Flexible sigmoidoscopy or colonoscopy. You may have a sigmoidoscopy every 5 years or a colonoscopy every 10 years starting at age 14.  Prostate cancer screening. Recommendations will vary depending on your family history and other risks.  Hepatitis C blood test.  Hepatitis B blood test.  Sexually transmitted disease (STD) testing.  Diabetes screening. This is done by checking your blood sugar (glucose) after you have not eaten for a while (fasting). You may have this done every 1-3 years.  Abdominal aortic aneurysm (AAA) screening. You may need this if you are a current or former smoker.  Osteoporosis. You may be screened starting at age 84 if you are at high risk. Talk with your health care provider about your test results, treatment options, and if necessary, the need for more tests. Vaccines  Your health care provider may recommend certain  vaccines, such as:  Influenza vaccine. This is recommended every year.  Tetanus, diphtheria, and acellular pertussis (Tdap, Td) vaccine. You may need a Td booster every 10 years.  Zoster vaccine. You may need this after age 3.  Pneumococcal 13-valent conjugate  (PCV13) vaccine. One dose is recommended after age 76.  Pneumococcal polysaccharide (PPSV23) vaccine. One dose is recommended after age 48. Talk to your health care provider about which screenings and vaccines you need and how often you need them. This information is not intended to replace advice given to you by your health care provider. Make sure you discuss any questions you have with your health care provider. Document Released: 04/08/2015 Document Revised: 11/30/2015 Document Reviewed: 01/11/2015 Elsevier Interactive Patient Education  2017 [place] Prevention in the Home Falls can cause injuries. They can happen to people of all ages. There are many things you can do to make your home safe and to help prevent falls. What can I do on the outside of my home?  Regularly fix the edges of walkways and driveways and fix any cracks.  Remove anything that might make you trip as you walk through a door, such as a raised step or threshold.  Trim any bushes or trees on the path to your home.  Use bright outdoor lighting.  Clear any walking paths of anything that might make someone trip, such as rocks or tools.  Regularly check to see if handrails are loose or broken. Make sure that both sides of any steps have handrails.  Any raised decks and porches should have guardrails on the edges.  Have any leaves, snow, or ice cleared regularly.  Use sand or salt on walking paths during winter.  Clean up any spills in your garage right away. This includes oil or grease spills. What can I do in the bathroom?  Use night lights.  Install grab bars by the toilet and in the tub and shower. Do not use towel bars as grab bars.  Use non-skid mats or decals in the tub or shower.  If you need to sit down in the shower, use a plastic, non-slip stool.  Keep the floor dry. Clean up any water that spills on the floor as soon as it happens.  Remove soap buildup in the tub or shower  regularly.  Attach bath mats securely with double-sided non-slip rug tape.  Do not have throw rugs and other things on the floor that can make you trip. What can I do in the bedroom?  Use night lights.  Make sure that you have a light by your bed that is easy to reach.  Do not use any sheets or blankets that are too big for your bed. They should not hang down onto the floor.  Have a firm chair that has side arms. You can use this for support while you get dressed.  Do not have throw rugs and other things on the floor that can make you trip. What can I do in the kitchen?  Clean up any spills right away.  Avoid walking on wet floors.  Keep items that you use a lot in easy-to-reach places.  If you need to reach something above you, use a strong step stool that has a grab bar.  Keep electrical cords out of the way.  Do not use floor polish or wax that makes floors slippery. If you must use wax, use non-skid floor wax.  Do not have throw rugs and other things  on the floor that can make you trip. What can I do with my stairs?  Do not leave any items on the stairs.  Make sure that there are handrails on both sides of the stairs and use them. Fix handrails that are broken or loose. Make sure that handrails are as long as the stairways.  Check any carpeting to make sure that it is firmly attached to the stairs. Fix any carpet that is loose or worn.  Avoid having throw rugs at the top or bottom of the stairs. If you do have throw rugs, attach them to the floor with carpet tape.  Make sure that you have a light switch at the top of the stairs and the bottom of the stairs. If you do not have them, ask someone to add them for you. What else can I do to help prevent falls?  Wear shoes that:  Do not have high heels.  Have rubber bottoms.  Are comfortable and fit you well.  Are closed at the toe. Do not wear sandals.  If you use a stepladder:  Make sure that it is fully  opened. Do not climb a closed stepladder.  Make sure that both sides of the stepladder are locked into place.  Ask someone to hold it for you, if possible.  Clearly mark and make sure that you can see:  Any grab bars or handrails.  First and last steps.  Where the edge of each step is.  Use tools that help you move around (mobility aids) if they are needed. These include:  Canes.  Walkers.  Scooters.  Crutches.  Turn on the lights when you go into a dark area. Replace any light bulbs as soon as they burn out.  Set up your furniture so you have a clear path. Avoid moving your furniture around.  If any of your floors are uneven, fix them.  If there are any pets around you, be aware of where they are.  Review your medicines with your doctor. Some medicines can make you feel dizzy. This can increase your chance of falling. Ask your doctor what other things that you can do to help prevent falls. This information is not intended to replace advice given to you by your health care provider. Make sure you discuss any questions you have with your health care provider. Document Released: 01/06/2009 Document Revised: 08/18/2015 Document Reviewed: 04/16/2014 Elsevier Interactive Patient Education  2017 [employer].

## 2019-01-13 NOTE — Assessment & Plan Note (Signed)
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 

## 2019-01-13 NOTE — Assessment & Plan Note (Signed)
Followed by Dr Esperanza Heir Onc S/p rad therapy, no surgical removal Stable monitoring PSA per Oncology - already had result from 08/2018 slight elevation >4, now due in 03/2019, we did not opt to check PSA at this visit due to it already being on q 6 month surveillance, patient agrees with this plan he will let us know if we need to add PSA

## 2019-01-14 LAB — HM DIABETES EYE EXAM

## 2019-01-15 DIAGNOSIS — Z85820 Personal history of malignant melanoma of skin: Secondary | ICD-10-CM | POA: Diagnosis not present

## 2019-01-15 DIAGNOSIS — L821 Other seborrheic keratosis: Secondary | ICD-10-CM | POA: Diagnosis not present

## 2019-01-15 DIAGNOSIS — L918 Other hypertrophic disorders of the skin: Secondary | ICD-10-CM | POA: Diagnosis not present

## 2019-01-15 DIAGNOSIS — I8393 Asymptomatic varicose veins of bilateral lower extremities: Secondary | ICD-10-CM | POA: Diagnosis not present

## 2019-01-15 DIAGNOSIS — L578 Other skin changes due to chronic exposure to nonionizing radiation: Secondary | ICD-10-CM | POA: Diagnosis not present

## 2019-01-15 DIAGNOSIS — D1800 Hemangioma unspecified site: Secondary | ICD-10-CM | POA: Diagnosis not present

## 2019-01-15 DIAGNOSIS — D225 Melanocytic nevi of trunk: Secondary | ICD-10-CM | POA: Diagnosis not present

## 2019-01-15 DIAGNOSIS — Z1283 Encounter for screening for malignant neoplasm of skin: Secondary | ICD-10-CM | POA: Diagnosis not present

## 2019-01-15 DIAGNOSIS — Z86018 Personal history of other benign neoplasm: Secondary | ICD-10-CM | POA: Diagnosis not present

## 2019-01-16 ENCOUNTER — Encounter: Payer: Self-pay | Admitting: Family Medicine

## 2019-01-18 DIAGNOSIS — Z1211 Encounter for screening for malignant neoplasm of colon: Secondary | ICD-10-CM | POA: Diagnosis not present

## 2019-01-24 LAB — COLOGUARD: Cologuard: NEGATIVE

## 2019-01-27 ENCOUNTER — Encounter: Payer: Self-pay | Admitting: Family Medicine

## 2019-02-05 ENCOUNTER — Telehealth: Payer: Self-pay

## 2019-03-12 ENCOUNTER — Telehealth: Payer: Self-pay

## 2019-03-23 ENCOUNTER — Inpatient Hospital Stay: Payer: PPO | Attending: Radiation Oncology

## 2019-03-23 ENCOUNTER — Other Ambulatory Visit: Payer: Self-pay

## 2019-03-23 DIAGNOSIS — C61 Malignant neoplasm of prostate: Secondary | ICD-10-CM | POA: Diagnosis not present

## 2019-03-23 LAB — PSA: Prostatic Specific Antigen: 0.87 ng/mL (ref 0.00–4.00)

## 2019-03-30 ENCOUNTER — Ambulatory Visit
Admission: RE | Admit: 2019-03-30 | Discharge: 2019-03-30 | Disposition: A | Discharge status: Home / Self Care | Payer: PPO | Source: Ambulatory Visit | Attending: Radiation Oncology | Admitting: Radiation Oncology

## 2019-03-30 ENCOUNTER — Other Ambulatory Visit: Payer: Self-pay

## 2019-03-30 VITALS — BP 140/81 | HR 79 | Temp 97.0°F | Resp 18 | Wt 250.0 lb

## 2019-03-30 DIAGNOSIS — R9721 Rising PSA following treatment for malignant neoplasm of prostate: Secondary | ICD-10-CM | POA: Diagnosis not present

## 2019-03-30 DIAGNOSIS — C61 Malignant neoplasm of prostate: Secondary | ICD-10-CM

## 2019-03-30 DIAGNOSIS — Z923 Personal history of irradiation: Secondary | ICD-10-CM | POA: Diagnosis not present

## 2019-03-30 NOTE — Progress Notes (Signed)
Radiation Oncology Follow up Note  Name: Eric Stone.   Date:   03/30/2019 MRN:  [id number] DOB: [birth year]    This 74 y.o. 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  REFERRING PROVIDER: [name] *  HPI: Patient is a 74 year old male now about 5 years having completed IMRT radiation therapy to his prostate for a Gleason 6 (3+3) presenting with a PSA of 4.8.  He was treated last back in July with androgen deprivation therapy Lupron when his PSA had climbed back to 4.8.  Most recently it was 0.87.  He specifically denies any bone pain any increased lower urinary tract symptoms or diarrhea..  COMPLICATIONS OF TREATMENT: none  FOLLOW UP COMPLIANCE: keeps appointments   PHYSICAL EXAM:  BP 140/81   Pulse 79   Temp (!) 97 F (36.1 C)   Resp 18   Wt 250 lb (113.4 kg)   BMI 36.92 kg/m  Well-developed well-nourished patient in NAD. HEENT reveals PERLA, EOMI, discs not visualized.  Oral cavity is clear. No oral mucosal lesions are identified. Neck is clear without evidence of cervical or supraclavicular adenopathy. Lungs are clear to A&P. Cardiac examination is essentially unremarkable with regular rate and rhythm without murmur rub or thrill. Abdomen is benign with no organomegaly or masses noted. Motor sensory and DTR levels are equal and symmetric in the upper and lower extremities. Cranial nerves II through XII are grossly intact. Proprioception is intact. No peripheral adenopathy or edema is identified. No motor or sensory levels are noted. Crude visual fields are within normal range.  RADIOLOGY RESULTS: No current films for review  PLAN: At this time I will see him back in 6 months repeat a PSA at that time.  Should he have again elevated PSA may opt to again pulse him with Lupron.  Should that happen I will refer him to medical oncology for other considerations at this time.   Patient knows to call with any concerns at any time.  I would like to take this opportunity to thank you for allowing me to participate in the care of your patient.Noreene Filbert, MD

## 2019-05-15 ENCOUNTER — Other Ambulatory Visit: Payer: Self-pay | Admitting: Family Medicine

## 2019-05-15 DIAGNOSIS — I10 Essential (primary) hypertension: Secondary | ICD-10-CM

## 2019-06-12 ENCOUNTER — Other Ambulatory Visit: Payer: Self-pay | Admitting: Family Medicine

## 2019-06-12 ENCOUNTER — Ambulatory Visit (INDEPENDENT_AMBULATORY_CARE_PROVIDER_SITE_OTHER): Payer: PPO | Admitting: Family Medicine

## 2019-06-12 ENCOUNTER — Other Ambulatory Visit: Payer: Self-pay

## 2019-06-12 ENCOUNTER — Encounter: Payer: Self-pay | Admitting: Family Medicine

## 2019-06-12 VITALS — BP 113/58 | HR 69 | Temp 97.5°F | Resp 16 | Ht 69.0 in | Wt 246.0 lb

## 2019-06-12 DIAGNOSIS — N400 Enlarged prostate without lower urinary tract symptoms: Secondary | ICD-10-CM

## 2019-06-12 DIAGNOSIS — G4733 Obstructive sleep apnea (adult) (pediatric): Secondary | ICD-10-CM

## 2019-06-12 DIAGNOSIS — Z0000 Encounter for general adult medical examination without abnormal findings: Secondary | ICD-10-CM

## 2019-06-12 DIAGNOSIS — I10 Essential (primary) hypertension: Secondary | ICD-10-CM

## 2019-06-12 DIAGNOSIS — E785 Hyperlipidemia, unspecified: Secondary | ICD-10-CM

## 2019-06-12 DIAGNOSIS — Z9989 Dependence on other enabling machines and devices: Secondary | ICD-10-CM

## 2019-06-12 DIAGNOSIS — E1169 Type 2 diabetes mellitus with other specified complication: Secondary | ICD-10-CM

## 2019-06-12 DIAGNOSIS — Z89412 Acquired absence of left great toe: Secondary | ICD-10-CM

## 2019-06-12 LAB — POCT GLYCOSYLATED HEMOGLOBIN (HGB A1C): Hemoglobin A1C: 6.7 % — AB (ref 4.0–5.6)

## 2019-06-12 NOTE — Assessment & Plan Note (Signed)
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 

## 2019-06-12 NOTE — Patient Instructions (Addendum)
Thank you for coming to the office today.  Recent Labs    09/17/18 0815 01/06/19 0801 06/12/19 0934  HGBA1C 6.5* 6.6* 6.7*   Keep up the great work overall.   DUE for FASTING BLOOD WORK (no food or drink after midnight before the lab appointment, only water or coffee without cream/sugar on the morning of)  SCHEDULE "Lab Only" visit in the morning at the clinic for lab draw in 7 MONTHS   - Make sure Lab Only appointment is at about 1 week before your next appointment, so that results will be available  For Lab Results, once available within 2-3 days of blood draw, you can can log in to MyChart online to view your results and a brief explanation. Also, we can discuss results at next follow-up visit.   Please schedule a Follow-up Appointment to: Return in about 7 months (around 01/12/2020) for Annual Physical.  If you have any other questions or concerns, please feel free to call the office or send a message through [facility]. You may also schedule an earlier appointment if necessary.  Additionally, you may be receiving a survey about your experience at our office within a few days to 1 week by e-mail or mail. We value your feedback.  [name], DO [place]

## 2019-06-12 NOTE — Assessment & Plan Note (Signed)
Controlled cholesterol on lifestyle Last lipid panel 12/2018 Calculated ASCVD 10 yr risk score elevated at 19%, due to age, HTN, among other risk factors  Plan: 1. Continue Rosuvastatin 10mg  nightly 2. Continue ASA 81mg  for primary ASCVD risk reduction 3. Encourage improved lifestyle - low carb/cholesterol, reduce portion size, continue improving regular exercise

## 2019-06-12 NOTE — Assessment & Plan Note (Signed)
Controlled type 2 diabetes, A1c 6.7 today, still stable at goal < 7-8 No hypoglycemia or hyperglycemia Complications - hyperlipidemia, OSA - increases risk of future cardiovascular complications   Plan:  - Not on medication for DM, it is diet controlled - Encourage improved lifestyle - low carb, low sugar diet, reduce portion size, continue improving regular exercise - May continue chronic case management for comprehensive nursing education on diabetes among other benefits - Continue ASA, ACEi, Statin Future DM Eye Dr Terrilee Files 12/2019

## 2019-06-12 NOTE — Progress Notes (Signed)
Subjective:    Patient ID: Eric Stone., male    DOB: Jan 17, 1946, 74 y.o.   MRN: [id number]  Eric Stone. is a 74 y.o. male presenting on 06/12/2019 for Hypertension   HPI   CHRONIC HTN: Controlled. Not checking regularly now. Current Meds - Lisinopril 30mg  daily, HCTZ 12.5mg  daily Reports good compliance, took meds today. Tolerating well, w/o complaints. - Admits mild varicose veins edema, but improvedon compression stocking Denies CP, dyspnea, HA, dizziness / lightheadedness  Type 2 Diabetes Lab result A1c 6.5 to 6.6 on A1c, today due for A1c CBGs Never checked. Meds: neveron meds Currently on ACEi Lifestyle: - Diet (Trying to maintain improved diet, reduce carbs portions, inc water) - Exercise (Improved activity and exercise) - S/p L Great Toe Amputation Next DM Eye Exam 12/2019 Dr Terrilee Files Denies hypoglycemia, polyuria, visual changes, numbness or tingling.  HYPERLIPIDEMIA: - Reports no concerns. Last lipid panel10/2020, controlled  - Currently takingRosuvastatin 10mg  daily, tolerating well without side effects or myalgias - Needs refill  OSA, on CPAP - Patient reports prior history of dx OSA and on CPAP for >20 years, prior to treatment his initial symptoms were snoring, daytime sleepiness and fatigue, he has had several sleep studies in the past. Most recent 07-Aug-2014, with AHI 6.8 per hour, and mild to moderate OSA. - He reports that his sleep apnea is well controlled. He uses the CPAP machine every night. He tolerates the machine well, and thinks that he sleeps better with it and feels good. No new concerns or symptoms.  PMH -Melanoma, Left big toe / History of Skin CancerS/p amputation- followed by [facility] - Prostate CA, s/p radiation therapy - followed closely by Rad Onc Dr Baruch Gouty for PSA monitoring, had lower PSA now q 6 month testing> he is asking what goal PSAis. Last checked 07/2017  Health Maintenance: - Shingrix UTD x 2 doses at  pharmacy - Last Colon CA - Colonoscopy 2014 previously. Now completed Cologuard 12/2018 next due in 12/2021 - UTD TDap 2016 - Completed routine Hep C screening, Negative 12/2016 - UTD Pneumonia vaccine, received Pneumovax-23 at age 73 (2014), and then next dose was Prevnar-13 12/2013, has received both after age 54, therefore completed  UTD on COVID19 vaccine, both doses. Doing well. Will bring card to update dates next time.  Depression screen [facility] 2/9 06/12/2019 01/13/2019 01/13/2019  Decreased Interest 0 0 0  Down, Depressed, Hopeless 0 0 0  PHQ - 2 Score 0 0 0  Altered sleeping - 0 -  Tired, decreased energy - 0 -  Change in appetite - 0 -  Feeling bad or failure about yourself  - 0 -  Trouble concentrating - 0 -  Moving slowly or fidgety/restless - 0 -  Suicidal thoughts - 0 -  PHQ-9 Score - 0 -    Social History   Tobacco Use  . Smoking status: Former Smoker    Packs/day: 1.00    Years: 26.00    Pack years: 26.00    Types: Cigarettes    Quit date: 03/26/1989    Years since quitting: 30.2  . Smokeless tobacco: Former Systems developer  . Tobacco comment: less than 1 PPD  Substance Use Topics  . Alcohol use: No    Alcohol/week: 0.0 standard drinks  . Drug use: No    Review of Systems Per HPI unless specifically indicated above     Objective:    BP (!) 113/58   Pulse 69   Temp (!)  97.5 F (36.4 C) (Temporal)   Resp 16   Ht 5\' 9"  (1.753 m)   Wt 246 lb (111.6 kg)   BMI 36.33 kg/m   Wt Readings from Last 3 Encounters:  06/12/19 246 lb (111.6 kg)  03/30/19 250 lb (113.4 kg)  01/13/19 250 lb 12.8 oz (113.8 kg)    Physical Exam Vitals and nursing note reviewed.  Constitutional:      General: He is not in acute distress.    Appearance: He is well-developed. He is not diaphoretic.     Comments: Well-appearing, comfortable, cooperative  HENT:     Head: Normocephalic and atraumatic.  Eyes:     General:        Right eye: No discharge.        Left eye: No discharge.      Conjunctiva/sclera: Conjunctivae normal.  Neck:     Thyroid: No thyromegaly.  Cardiovascular:     Rate and Rhythm: Normal rate and regular rhythm.     Heart sounds: Normal heart sounds. No murmur.  Pulmonary:     Effort: Pulmonary effort is normal. No respiratory distress.     Breath sounds: Normal breath sounds. No wheezing or rales.  Musculoskeletal:        General: Normal range of motion.     Cervical back: Normal range of motion and neck supple.     Comments: S/p L great toe amputation  Lymphadenopathy:     Cervical: No cervical adenopathy.  Skin:    General: Skin is warm and dry.     Findings: No erythema or rash.  Neurological:     Mental Status: He is alert and oriented to person, place, and time.  Psychiatric:        Behavior: Behavior normal.     Comments: Well groomed, good eye contact, normal speech and thoughts       Recent Labs    09/17/18 0815 01/06/19 0801 06/12/19 0934  HGBA1C 6.5* 6.6* 6.7*     Results for orders placed or performed in visit on 06/12/19  POCT HgB A1C  Result Value Ref Range   Hemoglobin A1C 6.7 (A) 4.0 - 5.6 %      Assessment & Plan:   Problem List Items Addressed This Visit    Type 2 diabetes mellitus with other specified complication ([place]) - Primary    Controlled type 2 diabetes, A1c 6.7 today, still stable at goal < 7-8 No hypoglycemia or hyperglycemia Complications - hyperlipidemia, OSA - increases risk of future cardiovascular complications   Plan:  - Not on medication for DM, it is diet controlled - Encourage improved lifestyle - low carb, low sugar diet, reduce portion size, continue improving regular exercise - May continue chronic case management for comprehensive nursing education on diabetes among other benefits - Continue ASA, ACEi, Statin Future DM Eye Dr Terrilee Files 12/2019       Relevant Orders   POCT HgB A1C (Completed)   OSA on CPAP    Well controlled, chronic mild-mod OSA on CPAP - Good adherence to  CPAP nightly - Continue current CPAP therapy, patient is benefiting from therapy      Hyperlipidemia associated with type 2 diabetes mellitus ([place])    Controlled cholesterol on lifestyle Last lipid panel 12/2018 Calculated ASCVD 10 yr risk score elevated at 19%, due to age, HTN, among other risk factors  Plan: 1. Continue Rosuvastatin 10mg  nightly 2. Continue ASA 81mg  for primary ASCVD risk reduction 3. 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 Continue HCTZ 12.5mg  daily for HTN and also likely for edema, continue Lisinopril 30mg  daily 2. Encourage improved lifestyle - low sodium diet, improve regular exercise 3. Continue monitor BP outside office, bring readings to next visit, if persistently >140/90 or new symptoms notify office sooner         No orders of the defined types were placed in this encounter.    Follow up plan: Return in about 7 months (around 01/12/2020) for Annual Physical.   Future labs ordered for 01/05/20   [name], [facility] 06/12/2019, 9:32 AM

## 2019-06-12 NOTE — Assessment & Plan Note (Signed)
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

## 2019-06-12 NOTE — Assessment & Plan Note (Signed)
Controlled - Home BP readings not available No known complications  OFF Furosemide    Plan:  1. Continue HCTZ 12.5mg  daily for HTN and also likely for edema, continue Lisinopril 30mg  daily 2. Encourage improved lifestyle - low sodium diet, improve regular exercise 3. Continue monitor BP outside office, bring readings to next visit, if persistently >140/90 or new symptoms notify office sooner

## 2019-06-18 DIAGNOSIS — G4733 Obstructive sleep apnea (adult) (pediatric): Secondary | ICD-10-CM | POA: Diagnosis not present

## 2019-07-16 ENCOUNTER — Ambulatory Visit: Payer: PPO | Admitting: Dermatology

## 2019-09-24 ENCOUNTER — Other Ambulatory Visit: Payer: Self-pay

## 2019-09-24 ENCOUNTER — Inpatient Hospital Stay: Payer: PPO | Attending: Radiation Oncology

## 2019-09-24 DIAGNOSIS — C61 Malignant neoplasm of prostate: Secondary | ICD-10-CM | POA: Diagnosis not present

## 2019-09-24 DIAGNOSIS — Z5111 Encounter for antineoplastic chemotherapy: Secondary | ICD-10-CM | POA: Diagnosis not present

## 2019-09-24 LAB — PSA: Prostatic Specific Antigen: 5.08 ng/mL — ABNORMAL HIGH (ref 0.00–4.00)

## 2019-10-01 ENCOUNTER — Other Ambulatory Visit: Payer: Self-pay

## 2019-10-01 ENCOUNTER — Other Ambulatory Visit: Payer: Self-pay | Admitting: *Deleted

## 2019-10-01 ENCOUNTER — Encounter: Payer: Self-pay | Admitting: Radiation Oncology

## 2019-10-01 ENCOUNTER — Ambulatory Visit
Admission: RE | Admit: 2019-10-01 | Discharge: 2019-10-01 | Disposition: A | Discharge status: Home / Self Care | Payer: PPO | Source: Ambulatory Visit | Attending: Radiation Oncology | Admitting: Radiation Oncology

## 2019-10-01 DIAGNOSIS — C61 Malignant neoplasm of prostate: Secondary | ICD-10-CM

## 2019-10-05 ENCOUNTER — Encounter: Payer: Self-pay | Admitting: Oncology

## 2019-10-05 ENCOUNTER — Inpatient Hospital Stay: Payer: PPO

## 2019-10-05 ENCOUNTER — Inpatient Hospital Stay: Payer: PPO | Admitting: Oncology

## 2019-10-05 ENCOUNTER — Other Ambulatory Visit: Payer: Self-pay

## 2019-10-05 VITALS — BP 130/70 | HR 66 | Temp 97.6°F | Resp 18 | Wt 249.0 lb

## 2019-10-05 DIAGNOSIS — Z7189 Other specified counseling: Secondary | ICD-10-CM | POA: Diagnosis not present

## 2019-10-05 DIAGNOSIS — C61 Malignant neoplasm of prostate: Secondary | ICD-10-CM

## 2019-10-05 DIAGNOSIS — Z87891 Personal history of nicotine dependence: Secondary | ICD-10-CM

## 2019-10-05 DIAGNOSIS — Z923 Personal history of irradiation: Secondary | ICD-10-CM

## 2019-10-05 DIAGNOSIS — Z5111 Encounter for antineoplastic chemotherapy: Secondary | ICD-10-CM | POA: Diagnosis not present

## 2019-10-05 LAB — COMPREHENSIVE METABOLIC PANEL
ALT: 18 U/L (ref 0–44)
AST: 20 U/L (ref 15–41)
Albumin: 4.1 g/dL (ref 3.5–5.0)
Alkaline Phosphatase: 64 U/L (ref 38–126)
Anion gap: 9 (ref 5–15)
BUN: 18 mg/dL (ref 8–23)
CO2: 26 mmol/L (ref 22–32)
Calcium: 9.1 mg/dL (ref 8.9–10.3)
Chloride: 103 mmol/L (ref 98–111)
Creatinine, Ser: 1.04 mg/dL (ref 0.61–1.24)
GFR calc Af Amer: >60 mL/min (ref >60)
GFR calc non Af Amer: >60 mL/min (ref >60)
Glucose, Bld: 127 mg/dL — ABNORMAL HIGH (ref 70–99)
Potassium: 3.9 mmol/L (ref 3.5–5.1)
Sodium: 138 mmol/L (ref 135–145)
Total Bilirubin: 0.5 mg/dL (ref 0.3–1.2)
Total Protein: 7.3 g/dL (ref 6.5–8.1)

## 2019-10-05 LAB — CBC WITH DIFFERENTIAL/PLATELET
Abs Immature Granulocytes: 0.01 10*3/uL (ref 0.00–0.07)
Basophils Absolute: 0 10*3/uL (ref 0.0–0.1)
Basophils Relative: 1 %
Eosinophils Absolute: 0.3 10*3/uL (ref 0.0–0.5)
Eosinophils Relative: 4 %
HCT: 41.7 % (ref 39.0–52.0)
Hemoglobin: 14.6 g/dL (ref 13.0–17.0)
Immature Granulocytes: 0 %
Lymphocytes Relative: 20 %
Lymphs Abs: 1.5 10*3/uL (ref 0.7–4.0)
MCH: 30.3 pg (ref 26.0–34.0)
MCHC: 35 g/dL (ref 30.0–36.0)
MCV: 86.5 fL (ref 80.0–100.0)
Monocytes Absolute: 0.5 10*3/uL (ref 0.1–1.0)
Monocytes Relative: 7 %
Neutro Abs: 5.2 10*3/uL (ref 1.7–7.7)
Neutrophils Relative %: 68 %
Platelets: 204 10*3/uL (ref 150–400)
RBC: 4.82 MIL/uL (ref 4.22–5.81)
RDW: 13.2 % (ref 11.5–15.5)
WBC: 7.5 10*3/uL (ref 4.0–10.5)
nRBC: 0 % (ref 0.0–0.2)

## 2019-10-05 LAB — PSA: Prostatic Specific Antigen: 4.92 ng/mL — ABNORMAL HIGH (ref 0.00–4.00)

## 2019-10-05 MED ORDER — LEUPROLIDE ACETATE (6 MONTH) 45 MG ~~LOC~~ KIT
45.0000 mg | PACK | Freq: Once | SUBCUTANEOUS | Status: AC
  Administered 2019-10-05: 45 mg via SUBCUTANEOUS
  Filled 2019-10-05: qty 45

## 2019-10-05 NOTE — Progress Notes (Signed)
Hematology/Oncology Consult note [facility] [phone] [phone]  Patient Care Team: Olin Hauser, DO as PCP - General (Family Medicine) Luan Pulling Ronelle Nigh., MD (Inactive) (Unknown Physician Specialty) Grace Isaac, MD as Consulting Physician (Cardiothoracic Surgery) Clance, Armando Reichert, MD as Consulting Physician (Pulmonary Disease) Grace Isaac, MD as Consulting Physician (Cardiothoracic Surgery) Clance, Armando Reichert, MD as Consulting Physician (Pulmonary Disease) [place], Virl Diamond, 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 Baruch Gouty  Date of visit: 10/05/19   History of presenting illness-patient is a 73 year old male who was diagnosed with Gleason 6 adenocarcinoma of the prostateAbout 5 years ago and received radiation treatment.  He has not undergone the surgery for his prostate but has seen Dr. Bernardo Heater in the past.  He was receiving intermittent ADT and last received 45 mg of Eligard on 09/30/2018.  His PSA following that went down to 0.87 December 2020.  He did not receive any ADT after that and his most recent PSA on 09/24/2019 was elevated at 5.08.  Patient reports having significant hot flashes and fatigue when he gets ADT.  He has been referred to me for consideration for restarting ADT given his elevated PSA.  Patient currently reports fatigue but it is not as bad when he is in ADT.  Denies any hot flashes at this time.  Appetite and weight are stable.  Denies any unintentional weight loss or aches and pains anywhere  ECOG PS- 1  Pain scale- 0   Review of systems- Review of Systems  Constitutional: Positive for malaise/fatigue. Negative for chills, fever and weight loss.  HENT: Negative for congestion, ear discharge and nosebleeds.   Eyes: Negative  for blurred vision.  Respiratory: Negative for cough, hemoptysis, sputum production, shortness of breath and wheezing.   Cardiovascular: Negative for chest pain, palpitations, orthopnea and claudication.  Gastrointestinal: Negative for abdominal pain, blood in stool, constipation, diarrhea, heartburn, melena, nausea and vomiting.  Genitourinary: Negative for dysuria, flank pain, frequency, hematuria and urgency.  Musculoskeletal: Negative for back pain, joint pain and myalgias.  Skin: Negative for rash.  Neurological: Negative for dizziness, tingling, focal weakness, seizures, weakness and headaches.  Endo/Heme/Allergies: Does not bruise/bleed easily.  Psychiatric/Behavioral: Negative for depression and suicidal ideas. The patient does not have insomnia.     Allergies  Allergen Reactions  . Penicillins Other (See Comments)    As a child    Patient Active Problem List   Diagnosis Date Noted  . Malignant neoplasm of prostate ([place]) 05/16/2018  . History of amputation of left great toe ([place]) 01/14/2018  . Lymphedema of left lower extremity 10/21/2017  . Hyperlipidemia associated with type 2 diabetes mellitus ([place]) 01/02/2017  . OSA on CPAP 09/28/2016  . History of nonmelanoma skin cancer 09/27/2016  . History of melanoma 09/27/2016  . Essential hypertension 07/11/2015  . BPH without obstruction/lower urinary tract symptoms 07/17/2013  . History of pleural effusion 04/30/2012  . Lower extremity edema 04/30/2012  . Type 2 diabetes mellitus with other specified complication ([place]) 25/85/2778     Past Medical History:  Diagnosis Date  . Arthritis   . H/O blood clots   . H/O blood clots    in left leg in 1988  . Hx of dysplastic nevus 06/18/2013   L lat ankle - mild  . Melanoma ([place])  02/21/2016   L prox med plantar great toe - Breslow's 1.52mm, Clark level early 4  . Rash    occasionally and pt does see a dermatologist  . Sleep apnea    uses a CPAP;sleep study >42yrs ago      Past Surgical History:  Procedure Laterality Date  . TOE SURGERY     melanoma  . 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 VIDEO ASSISTED THORACOSCOPY (VATS)/DECORTICATION Left 05/06/2012   Procedure: VIDEO ASSISTED THORACOSCOPY (VATS) drainage loculated pleural effusion;  Surgeon: Grace Isaac, MD;  Location: [facility];  Service: Thoracic;  Laterality: Left;  Marland Kitchen VIDEO BRONCHOSCOPY N/A 05/06/2012   Procedure: VIDEO BRONCHOSCOPY;  Surgeon: Grace Isaac, MD;  Location: [facility] OR;  Service: Thoracic;  Laterality: N/A;    Social History   Socioeconomic History  . Marital status: Married    Spouse name: Not on file  . Number of children: Not on file  . Years of education: tech school  . Highest education level: High school graduate  Occupational History  . Occupation: retired  Tobacco Use  . Smoking status: Former Smoker    Packs/day: 1.00    Years: 26.00    Pack years: 26.00    Types: Cigarettes    Quit date: 03/26/1989    Years since quitting: 30.5  . Smokeless tobacco: Former Systems developer  . Tobacco comment: less than 1 PPD  Vaping Use  . Vaping Use: Never used  Substance and Sexual Activity  . Alcohol use: No    Alcohol/week: 0.0 standard drinks  . Drug use: No  . Sexual activity: Yes  Other Topics Concern  . Not on file  Social History Narrative  . Not on file   Social Determinants of Health   Financial Resource Strain:   . Difficulty of Paying Living Expenses:   Food Insecurity:   . Worried About Charity fundraiser in the Last Year:   . Arboriculturist in the Last Year:   Transportation Needs:   . Film/video editor (Medical):   Marland Kitchen Lack of Transportation (Non-Medical):   Physical Activity:   . Days of Exercise per Week:   . Minutes of Exercise per Session:   Stress:   . Feeling of Stress :   Social Connections:   . Frequency of Communication with Friends and Family:   . Frequency of Social Gatherings with Friends and Family:   . Attends  Religious Services:   . Active Member of Clubs or Organizations:   . Attends Archivist Meetings:   Marland Kitchen Marital Status:   Intimate Partner Violence:   . Fear of Current or Ex-Partner:   . Emotionally Abused:   Marland Kitchen Physically Abused:   . Sexually Abused:      Family History  Problem Relation Age of Onset  . Hypertension Mother   . Stroke Mother        possible  . Cancer Brother      Current Outpatient Medications:  .  aspirin EC 81 MG tablet, Take 81 mg by mouth., Disp: , Rfl:  .  hydrochlorothiazide (HYDRODIURIL) 12.5 MG tablet, Take 1 tablet by mouth once daily, Disp: 90 tablet, Rfl: 1 .  lisinopril (ZESTRIL) 30 MG tablet, Take 1 tablet (30 mg total) by mouth daily., Disp: 90 tablet, Rfl: 3 .  pimecrolimus (ELIDEL) 1 % cream, APPLY TO AFFECTED AREA GROIN TWICE DAILY AS DIRECTED, Disp: , Rfl:  .  rosuvastatin (CRESTOR) 10 MG  tablet, Take 1 tablet (10 mg total) by mouth at bedtime., Disp: 90 tablet, Rfl: 3   Physical exam:  Vitals:   10/05/19 1043  BP: 130/70  Pulse: 66  Resp: 18  Temp: 97.6 F (36.4 C)  TempSrc: Tympanic  SpO2: 98%  Weight: 249 lb (112.9 kg)   Physical Exam Constitutional:      General: He is not in acute distress. Cardiovascular:     Rate and Rhythm: Normal rate and regular rhythm.     Heart sounds: Normal heart sounds.  Pulmonary:     Effort: Pulmonary effort is normal.     Breath sounds: Normal breath sounds.  Abdominal:     General: Bowel sounds are normal.     Palpations: Abdomen is soft.  Skin:    General: Skin is warm and dry.  Neurological:     Mental Status: He is alert and oriented to person, place, and time.        CMP Latest Ref Rng & Units 10/05/2019  Glucose 70 - 99 mg/dL 127(H)  BUN 8 - 23 mg/dL 18  Creatinine 0.61 - 1.24 mg/dL 1.04  Sodium 135 - 145 mmol/L 138  Potassium 3.5 - 5.1 mmol/L 3.9  Chloride 98 - 111 mmol/L 103  CO2 22 - 32 mmol/L 26  Calcium 8.9 - 10.3 mg/dL 9.1  Total Protein 6.5 - 8.1 g/dL 7.3   Total Bilirubin 0.3 - 1.2 mg/dL 0.5  Alkaline Phos 38 - 126 U/L 64  AST 15 - 41 U/L 20  ALT 0 - 44 U/L 18   CBC Latest Ref Rng & Units 10/05/2019  WBC 4.0 - 10.5 K/uL 7.5  Hemoglobin 13.0 - 17.0 g/dL 14.6  Hematocrit 39 - 52 % 41.7  Platelets 150 - 400 K/uL 204    Assessment and plan- Patient is a 74 y.o. male with castrate sensitive prostate cancer referred for further management  Most recent PSA was elevated at 5.  Patient has not received ADT since July 2020.  The only treatment he has ever received for his prostate cancer was IMRT about 5 years ago.  I will touch base with Dr. Bernardo Heater to see if there would be any role for salvage surgery to his prostate at this time.  I will restart his Eligard and give him 45 mg dose today.  Discussed risks and benefits of Eligard/ADT including all but not limited to fatigue, hot flashes, gynecomastia, anemia.  Patient understands and agrees to proceed as planned.  Treatment is being given with a palliative intent  Repeat CBC with differential CMP, testosterone and PSA in 3 months in 6 months and I will see him in 6 months.  I will plan to get a bone scan sometime in the next 2 weeks.  Patient will call us if he has any questions or concerns sooner.  Discussed natural history of prostate cancer and differences between castrate resistant and castrate sensitive disease.  Currently patient has evidence of biochemical failure after radiation treatment   Thank you for this kind referral and the opportunity to participate in the care of this patient   Visit Diagnosis 1. Prostate cancer ([place])   2. Goals of care, counseling/discussion     Dr. Randa Evens, MD, MPH [facility] [phone] 10/05/2019  4:37 PM

## 2019-10-06 DIAGNOSIS — G4733 Obstructive sleep apnea (adult) (pediatric): Secondary | ICD-10-CM | POA: Diagnosis not present

## 2019-10-06 LAB — TESTOSTERONE: Testosterone: 340 ng/dL (ref 264–916)

## 2019-10-14 ENCOUNTER — Other Ambulatory Visit: Payer: Self-pay

## 2019-10-14 ENCOUNTER — Ambulatory Visit: Payer: PPO | Admitting: Dermatology

## 2019-10-14 ENCOUNTER — Encounter: Payer: Self-pay | Admitting: Dermatology

## 2019-10-14 DIAGNOSIS — L918 Other hypertrophic disorders of the skin: Secondary | ICD-10-CM

## 2019-10-14 DIAGNOSIS — Z86018 Personal history of other benign neoplasm: Secondary | ICD-10-CM | POA: Diagnosis not present

## 2019-10-14 DIAGNOSIS — D229 Melanocytic nevi, unspecified: Secondary | ICD-10-CM | POA: Diagnosis not present

## 2019-10-14 DIAGNOSIS — I8310 Varicose veins of unspecified lower extremity with inflammation: Secondary | ICD-10-CM

## 2019-10-14 DIAGNOSIS — D1800 Hemangioma unspecified site: Secondary | ICD-10-CM

## 2019-10-14 DIAGNOSIS — Z85820 Personal history of malignant melanoma of skin: Secondary | ICD-10-CM | POA: Diagnosis not present

## 2019-10-14 DIAGNOSIS — L814 Other melanin hyperpigmentation: Secondary | ICD-10-CM | POA: Diagnosis not present

## 2019-10-14 DIAGNOSIS — L821 Other seborrheic keratosis: Secondary | ICD-10-CM | POA: Diagnosis not present

## 2019-10-14 DIAGNOSIS — Z1283 Encounter for screening for malignant neoplasm of skin: Secondary | ICD-10-CM | POA: Diagnosis not present

## 2019-10-14 DIAGNOSIS — L578 Other skin changes due to chronic exposure to nonionizing radiation: Secondary | ICD-10-CM

## 2019-10-14 NOTE — Progress Notes (Signed)
   Follow-Up Visit   Subjective  Eric Stone. is a 74 y.o. male who presents for the following: Annual Exam (h/o MM on the L prox med plantar great toe - patient hasn't noticed any new or changing moles, lesions, or spots). The patient presents for Total-Body Skin Exam (TBSE) for skin cancer screening and mole check.  The following portions of the chart were reviewed this encounter and updated as appropriate:  Tobacco  Allergies  Meds  Problems  Med Hx  Surg Hx  Fam Hx     Review of Systems:  No other skin or systemic complaints except as noted in HPI or Assessment and Plan.  Objective  Well appearing patient in no apparent distress; mood and affect are within normal limits.  A full examination was performed including scalp, head, eyes, ears, nose, lips, neck, chest, axillae, abdomen, back, buttocks, bilateral upper extremities, bilateral lower extremities, hands, feet, fingers, toes, fingernails, and toenails. All findings within normal limits unless otherwise noted below.  Objective  L lat ankle: Scar with no evidence of recurrence.   Assessment & Plan    History of Melanoma L prox med plantar great toe - Amputation Site Clear - sentinel lymph node biopsy negative - No evidence of recurrence today - No lymphadenopathy - Recommend regular full body skin exams - Recommend daily broad spectrum sunscreen SPF 30+ to sun-exposed areas, reapply every 2 hours as needed.  - Call if any new or changing lesions are noted between office visits  History of dysplastic nevus L lat ankle  Clear. Observe for recurrence. Call clinic for new or changing lesions.  Recommend regular skin exams, daily broad-spectrum spf 30+ sunscreen use, and photoprotection.      Skin cancer screening  Lentigines - Scattered tan macules - Discussed due to sun exposure - Benign, observe - Call for any changes  Seborrheic Keratoses - Stuck-on, waxy, tan-brown papules and plaques  - Discussed  benign etiology and prognosis. - Observe - Call for any changes  Melanocytic Nevi - Tan-brown and/or pink-flesh-colored symmetric macules and papules - Benign appearing on exam today - Observation - Call clinic for new or changing moles - Recommend daily use of broad spectrum spf 30+ sunscreen to sun-exposed areas.   Hemangiomas - Red papules - Discussed benign nature - Observe - Call for any changes  Actinic Damage - diffuse scaly erythematous macules with underlying dyspigmentation - Recommend daily broad spectrum sunscreen SPF 30+ to sun-exposed areas, reapply every 2 hours as needed.  - Call for new or changing lesions.  Acrochordons (Skin Tags) - Fleshy, skin-colored pedunculated papules - Benign appearing.  - Observe. - If desired, they can be removed with an in office procedure that is not covered by insurance. - Please call the clinic if you notice any new or changing lesions.  Varicose Veins - not bothersome per patient - Dilated blue, purple or red veins at the lower extremities - Reassured - These can be treated by sclerotherapy (a procedure to inject a medicine into the veins to make them disappear) if desired, but the treatment is not covered by insurance  Skin cancer screening performed today.  Return in about 6 months (around 04/15/2020) for TBSE.  [name], CMA, am acting as scribe for Sarina Ser, MD .  Documentation: I have reviewed the above documentation for accuracy and completeness, and I agree with the above.  Sarina Ser, MD

## 2019-10-14 NOTE — Patient Instructions (Signed)
Melanoma ABCDEs  Melanoma is the most dangerous type of skin cancer, and is the leading cause of death from skin disease.  You are more likely to develop melanoma if you:  Have light-colored skin, light-colored eyes, or red or blond hair  Spend a lot of time in the sun  Tan regularly, either outdoors or in a tanning bed  Have had blistering sunburns, especially during childhood  Have a close family member who has had a melanoma  Have atypical moles or large birthmarks  Early detection of melanoma is key since treatment is typically straightforward and cure rates are extremely high if we catch it early.   The first sign of melanoma is often a change in a mole or a new dark spot.  The ABCDE system is a way of remembering the signs of melanoma.  A for asymmetry:  The two halves do not match. B for border:  The edges of the growth are irregular. C for color:  A mixture of colors are present instead of an even brown color. D for diameter:  Melanomas are usually (but not always) greater than 62mm - the size of a pencil eraser. E for evolution:  The spot keeps changing in size, shape, and color.  Please check your skin once per month between visits. You can use a small mirror in front and a large mirror behind you to keep an eye on the back side or your body.   If you see any new or changing lesions before your next follow-up, please call to schedule a visit.  Please continue daily skin protection including broad spectrum sunscreen SPF 30+ to sun-exposed areas, reapplying every 2 hours as needed when you're outdoors.

## 2019-10-18 ENCOUNTER — Encounter: Payer: Self-pay | Admitting: Dermatology

## 2019-10-19 ENCOUNTER — Encounter
Admission: RE | Admit: 2019-10-19 | Discharge: 2019-10-19 | Disposition: A | Discharge status: Home / Self Care | Payer: PPO | Source: Ambulatory Visit | Attending: Oncology | Admitting: Oncology

## 2019-10-19 ENCOUNTER — Other Ambulatory Visit: Payer: Self-pay

## 2019-10-19 DIAGNOSIS — C61 Malignant neoplasm of prostate: Secondary | ICD-10-CM

## 2019-10-19 MED ORDER — TECHNETIUM TC 99M MEDRONATE IV KIT
20.0000 | PACK | Freq: Once | INTRAVENOUS | Status: AC | PRN
  Administered 2019-10-19: 22.79 via INTRAVENOUS

## 2019-11-01 ENCOUNTER — Other Ambulatory Visit: Payer: Self-pay | Admitting: Family Medicine

## 2019-11-01 DIAGNOSIS — E7800 Pure hypercholesterolemia, unspecified: Secondary | ICD-10-CM

## 2019-11-01 NOTE — Telephone Encounter (Signed)
Requested Prescriptions  Pending Prescriptions Disp Refills  . rosuvastatin (CRESTOR) 10 MG tablet [Pharmacy Med Name: Rosuvastatin Calcium 10 MG Oral Tablet] 90 tablet 0    Sig: TAKE 1 TABLET BY MOUTH AT BEDTIME     Cardiovascular:  Antilipid - Statins Passed - 11/01/2019  1:24 PM      Passed - Total Cholesterol in normal range and within 360 days    Cholesterol, Total  Date Value Ref Range Status  07/12/2015 156 100 - 199 mg/dL Final   Cholesterol  Date Value Ref Range Status  01/06/2019 133 <200 mg/dL Final         Passed - LDL in normal range and within 360 days    LDL Cholesterol (Calc)  Date Value Ref Range Status  01/06/2019 67 mg/dL (calc) Final    Comment:    Reference range: <100 . Desirable range <100 mg/dL for primary prevention;   <70 mg/dL for patients with CHD or diabetic patients  with > or = 2 CHD risk factors. Marland Kitchen LDL-C is now calculated using the Martin-Hopkins  calculation, which is a validated novel method providing  better accuracy than the Friedewald equation in the  estimation of LDL-C.  Cresenciano Genre et al. Annamaria Helling. 4920;100(71): 2061-2068  (http://education.[facility].com/faq/FAQ164)          Passed - HDL in normal range and within 360 days    HDL  Date Value Ref Range Status  01/06/2019 45 > OR = 40 mg/dL Final  07/12/2015 40 >39 mg/dL Final         Passed - Triglycerides in normal range and within 360 days    Triglycerides  Date Value Ref Range Status  01/06/2019 133 <150 mg/dL Final         Passed - Patient is not pregnant      Passed - Valid encounter within last 12 months    Recent Outpatient Visits          4 months ago Type 2 diabetes mellitus with other specified complication, without long-term current use of insulin ([place])   [facility], Devonne Doughty, DO   9 months ago Annual physical exam   [facility] [place], Devonne Doughty, DO   1 year ago Type 2 diabetes mellitus with other  specified complication, without long-term current use of insulin [facility])   [facility] Olin Hauser, DO   1 year ago Essential hypertension   [facility] Olin Hauser, DO   1 year ago Annual physical exam   [facility] Olin Hauser, DO      Future Appointments            In 2 months [facility], Devonne Doughty, [facility], [facility]

## 2019-11-12 ENCOUNTER — Other Ambulatory Visit: Payer: Self-pay | Admitting: Family Medicine

## 2019-11-12 DIAGNOSIS — I10 Essential (primary) hypertension: Secondary | ICD-10-CM

## 2019-11-12 NOTE — Telephone Encounter (Signed)
Requested Prescriptions  Pending Prescriptions Disp Refills   hydrochlorothiazide (HYDRODIURIL) 12.5 MG tablet [Pharmacy Med Name: hydroCHLOROthiazide 12.5 MG Oral Tablet] 90 tablet 0    Sig: Take 1 tablet by mouth once daily     Cardiovascular: Diuretics - Thiazide Passed - 11/12/2019  5:30 AM      Passed - Ca in normal range and within 360 days    Calcium  Date Value Ref Range Status  10/05/2019 9.1 8.9 - 10.3 mg/dL Final   Calcium, Total  Date Value Ref Range Status  04/17/2012 8.6 8.5 - 10.1 mg/dL Final         Passed - Cr in normal range and within 360 days    Creat  Date Value Ref Range Status  01/06/2019 1.00 0.70 - 1.18 mg/dL Final    Comment:    For patients >45 years of age, the reference limit for Creatinine is approximately 13% higher for people identified as African-American. .    Creatinine, Ser  Date Value Ref Range Status  10/05/2019 1.04 0.61 - 1.24 mg/dL Final         Passed - K in normal range and within 360 days    Potassium  Date Value Ref Range Status  10/05/2019 3.9 3.5 - 5.1 mmol/L Final  04/17/2012 3.6 3.5 - 5.1 mmol/L Final         Passed - Na in normal range and within 360 days    Sodium  Date Value Ref Range Status  10/05/2019 138 135 - 145 mmol/L Final  07/12/2015 143 134 - 144 mmol/L Final  04/17/2012 137 136 - 145 mmol/L Final         Passed - Last BP in normal range    BP Readings from Last 1 Encounters:  10/05/19 130/70         Passed - Valid encounter within last 6 months    Recent Outpatient Visits          5 months ago Type 2 diabetes mellitus with other specified complication, without long-term current use of insulin ([place])   [place], DO   10 months ago Annual physical exam   [place], Devonne Doughty, DO   1 year ago Type 2 diabetes mellitus with other specified complication, without long-term current use of insulin [facility])   [place], Devonne Doughty, DO   1 year ago Essential hypertension   [facility] Olin Hauser, DO   1 year ago Annual physical exam   [place], DO      Future Appointments            In 2 months [facility], Devonne Doughty, [facility], [facility]

## 2019-11-13 ENCOUNTER — Other Ambulatory Visit: Payer: Self-pay | Admitting: Family Medicine

## 2019-11-13 DIAGNOSIS — I10 Essential (primary) hypertension: Secondary | ICD-10-CM

## 2020-01-05 ENCOUNTER — Other Ambulatory Visit: Payer: PPO

## 2020-01-05 ENCOUNTER — Other Ambulatory Visit: Payer: Self-pay

## 2020-01-05 DIAGNOSIS — E1169 Type 2 diabetes mellitus with other specified complication: Secondary | ICD-10-CM

## 2020-01-05 DIAGNOSIS — Z0000 Encounter for general adult medical examination without abnormal findings: Secondary | ICD-10-CM

## 2020-01-05 DIAGNOSIS — I10 Essential (primary) hypertension: Secondary | ICD-10-CM

## 2020-01-05 DIAGNOSIS — E785 Hyperlipidemia, unspecified: Secondary | ICD-10-CM | POA: Diagnosis not present

## 2020-01-06 LAB — COMPLETE METABOLIC PANEL WITH GFR
AG Ratio: 1.6 (calc) (ref 1.0–2.5)
ALT: 18 U/L (ref 9–46)
AST: 17 U/L (ref 10–35)
Albumin: 4.1 g/dL (ref 3.6–5.1)
Alkaline phosphatase (APISO): 59 U/L (ref 35–144)
BUN: 18 mg/dL (ref 7–25)
CO2: 25 mmol/L (ref 20–32)
Calcium: 9.3 mg/dL (ref 8.6–10.3)
Chloride: 104 mmol/L (ref 98–110)
Creat: 1.02 mg/dL (ref 0.70–1.18)
GFR, Est African American: 85 mL/min/{1.73_m2} (ref >=60)
GFR, Est Non African American: 73 mL/min/{1.73_m2} (ref >=60)
Globulin: 2.5 g/dL (calc) (ref 1.9–3.7)
Glucose, Bld: 116 mg/dL — ABNORMAL HIGH (ref 65–99)
Potassium: 4.1 mmol/L (ref 3.5–5.3)
Sodium: 141 mmol/L (ref 135–146)
Total Bilirubin: 0.5 mg/dL (ref 0.2–1.2)
Total Protein: 6.6 g/dL (ref 6.1–8.1)

## 2020-01-06 LAB — LIPID PANEL
Cholesterol: 120 mg/dL (ref <200)
HDL: 46 mg/dL (ref >=40)
LDL Cholesterol (Calc): 54 mg/dL (calc)
Non-HDL Cholesterol (Calc): 74 mg/dL (calc) (ref <130)
Total CHOL/HDL Ratio: 2.6 (calc) (ref <5.0)
Triglycerides: 116 mg/dL (ref <150)

## 2020-01-08 ENCOUNTER — Inpatient Hospital Stay: Payer: PPO | Attending: Oncology

## 2020-01-08 ENCOUNTER — Other Ambulatory Visit: Payer: Self-pay

## 2020-01-08 DIAGNOSIS — C61 Malignant neoplasm of prostate: Secondary | ICD-10-CM | POA: Diagnosis not present

## 2020-01-08 LAB — COMPREHENSIVE METABOLIC PANEL
ALT: 20 U/L (ref 0–44)
AST: 22 U/L (ref 15–41)
Albumin: 4 g/dL (ref 3.5–5.0)
Alkaline Phosphatase: 56 U/L (ref 38–126)
Anion gap: 10 (ref 5–15)
BUN: 19 mg/dL (ref 8–23)
CO2: 27 mmol/L (ref 22–32)
Calcium: 8.8 mg/dL — ABNORMAL LOW (ref 8.9–10.3)
Chloride: 105 mmol/L (ref 98–111)
Creatinine, Ser: 0.91 mg/dL (ref 0.61–1.24)
GFR, Estimated: >60 mL/min (ref >60)
Glucose, Bld: 147 mg/dL — ABNORMAL HIGH (ref 70–99)
Potassium: 3.8 mmol/L (ref 3.5–5.1)
Sodium: 142 mmol/L (ref 135–145)
Total Bilirubin: 0.7 mg/dL (ref 0.3–1.2)
Total Protein: 7.2 g/dL (ref 6.5–8.1)

## 2020-01-08 LAB — CBC WITH DIFFERENTIAL/PLATELET
Abs Immature Granulocytes: 0.02 10*3/uL (ref 0.00–0.07)
Basophils Absolute: 0 10*3/uL (ref 0.0–0.1)
Basophils Relative: 1 %
Eosinophils Absolute: 0.3 10*3/uL (ref 0.0–0.5)
Eosinophils Relative: 4 %
HCT: 39.3 % (ref 39.0–52.0)
Hemoglobin: 13.6 g/dL (ref 13.0–17.0)
Immature Granulocytes: 0 %
Lymphocytes Relative: 18 %
Lymphs Abs: 1.2 10*3/uL (ref 0.7–4.0)
MCH: 30.2 pg (ref 26.0–34.0)
MCHC: 34.6 g/dL (ref 30.0–36.0)
MCV: 87.1 fL (ref 80.0–100.0)
Monocytes Absolute: 0.5 10*3/uL (ref 0.1–1.0)
Monocytes Relative: 7 %
Neutro Abs: 4.5 10*3/uL (ref 1.7–7.7)
Neutrophils Relative %: 70 %
Platelets: 206 10*3/uL (ref 150–400)
RBC: 4.51 MIL/uL (ref 4.22–5.81)
RDW: 13.1 % (ref 11.5–15.5)
WBC: 6.5 10*3/uL (ref 4.0–10.5)
nRBC: 0 % (ref 0.0–0.2)

## 2020-01-08 LAB — PSA: Prostatic Specific Antigen: 1.72 ng/mL (ref 0.00–4.00)

## 2020-01-09 LAB — TESTOSTERONE: Testosterone: 27 ng/dL — ABNORMAL LOW (ref 264–916)

## 2020-01-12 ENCOUNTER — Telehealth: Payer: Self-pay

## 2020-01-12 ENCOUNTER — Other Ambulatory Visit: Payer: Self-pay

## 2020-01-12 ENCOUNTER — Ambulatory Visit (INDEPENDENT_AMBULATORY_CARE_PROVIDER_SITE_OTHER): Payer: PPO | Admitting: Family Medicine

## 2020-01-12 ENCOUNTER — Encounter: Payer: Self-pay | Admitting: Family Medicine

## 2020-01-12 VITALS — BP 120/58 | HR 59 | Temp 97.6°F | Resp 16 | Ht 69.0 in | Wt 249.6 lb

## 2020-01-12 DIAGNOSIS — G4733 Obstructive sleep apnea (adult) (pediatric): Secondary | ICD-10-CM | POA: Diagnosis not present

## 2020-01-12 DIAGNOSIS — E7800 Pure hypercholesterolemia, unspecified: Secondary | ICD-10-CM | POA: Diagnosis not present

## 2020-01-12 DIAGNOSIS — E785 Hyperlipidemia, unspecified: Secondary | ICD-10-CM | POA: Diagnosis not present

## 2020-01-12 DIAGNOSIS — Z23 Encounter for immunization: Secondary | ICD-10-CM

## 2020-01-12 DIAGNOSIS — E1169 Type 2 diabetes mellitus with other specified complication: Secondary | ICD-10-CM | POA: Diagnosis not present

## 2020-01-12 DIAGNOSIS — Z9989 Dependence on other enabling machines and devices: Secondary | ICD-10-CM | POA: Diagnosis not present

## 2020-01-12 DIAGNOSIS — Z89412 Acquired absence of left great toe: Secondary | ICD-10-CM | POA: Diagnosis not present

## 2020-01-12 DIAGNOSIS — Z0000 Encounter for general adult medical examination without abnormal findings: Secondary | ICD-10-CM

## 2020-01-12 DIAGNOSIS — I10 Essential (primary) hypertension: Secondary | ICD-10-CM | POA: Diagnosis not present

## 2020-01-12 DIAGNOSIS — C61 Malignant neoplasm of prostate: Secondary | ICD-10-CM | POA: Diagnosis not present

## 2020-01-12 LAB — POCT GLYCOSYLATED HEMOGLOBIN (HGB A1C): Hemoglobin A1C: 6.7 % — AB (ref 4.0–5.6)

## 2020-01-12 MED ORDER — HYDROCHLOROTHIAZIDE 12.5 MG PO TABS: 12.5000 mg | ORAL_TABLET | Freq: Every day | ORAL | 3 refills | Status: DC

## 2020-01-12 MED ORDER — ROSUVASTATIN CALCIUM 10 MG PO TABS: 10.0000 mg | ORAL_TABLET | Freq: Every day | ORAL | 3 refills | Status: DC

## 2020-01-12 MED ORDER — LISINOPRIL 30 MG PO TABS: 30.0000 mg | ORAL_TABLET | Freq: Every day | ORAL | 3 refills | Status: DC

## 2020-01-12 NOTE — Progress Notes (Signed)
Subjective:    Patient ID: Eric Stone., male    DOB: [birth year], 74 y.o.   MRN: [id number]  Eric Stone. is a 74 y.o. male presenting on 01/12/2020 for Annual Exam   HPI   Here for Annual Physical and Lab Review.  CHRONIC HTN: Controlled. Not checking regularly now. Current Meds - Lisinopril 30mg  daily, HCTZ 12.5mg  daily Reports good compliance, took meds today. Tolerating well, w/o complaints. - Admits mild varicose veins edema, but improvedon compression stocking Denies CP, dyspnea, HA, dizziness / lightheadedness  Type 2 Diabetes Lab result A1c 6.5 to 6.6 on A1c, today due for A1c CBGs Never checked. Meds: neveron meds Currently on ACEi Lifestyle: - Diet (Trying to maintain improved diet, reduce carbs portions, inc water) - Exercise (Improved activity and exercise) - S/p L Great Toe Amputation Next DM Eye Exam 12/2019 Dr Terrilee Files Denies hypoglycemia, polyuria, visual changes, numbness or tingling.  HYPERLIPIDEMIA: - Reports no concerns. Last lipid panel10/2021, controlled  - Currently takingRosuvastatin 10mg  daily, tolerating well without side effects or myalgias - Needs refill  OSA, on CPAP - Patient reports prior history of dx OSA and on CPAP for >20 years, prior to treatment his initial symptoms were snoring, daytime sleepiness and fatigue, he has had several sleep studies in the past. Most recent 19-Jul-2014, with AHI 6.8 per hour, and mild to moderate OSA. - He reports that his sleep apnea is well controlled. He uses the CPAP machine every night. He tolerates the machine well, and thinks that he sleeps better with it and feels good. No new concerns or symptoms.  PMH -Melanoma, Left big toe / History of Skin CancerS/p amputation- followed by [facility]  S/p prostate cancer Followed by Dr Janese Banks and Dr Baruch Gouty for Prostate Cancer - Last lab PSA improved to 1.72 (12/2019), prior PSA 4-5 - He is on injection therapy  Health Maintenance:  - Shingrix  UTD x 2 doses at [place] -Colonoscopy 2014 previously. Now completed Cologuard 12/2018 next due in 12/2021 - UTD TDap 2016 - Completed routine Hep C screening, Negative 12/2016  - UTD Pneumonia vaccine, received Pneumovax-23 at age 71 (2014), and then next dose was Prevnar-13 12/2013, has received both after age 41, therefore completed  UTD on [place] x 2 doses, last in 05/2019, advised on booster  Due for Flu Shot, will receive today      Depression screen [facility] 2/9 01/12/2020 06/12/2019 01/13/2019  Decreased Interest 0 0 0  Down, Depressed, Hopeless 0 0 0  PHQ - 2 Score 0 0 0  Altered sleeping - - 0  Tired, decreased energy - - 0  Change in appetite - - 0  Feeling bad or failure about yourself  - - 0  Trouble concentrating - - 0  Moving slowly or fidgety/restless - - 0  Suicidal thoughts - - 0  PHQ-9 Score - - 0    Past Medical History:  Diagnosis Date  . Arthritis   . H/O blood clots   . H/O blood clots    in left leg in 1988  . Hx of dysplastic nevus 06/18/2013   L lat ankle - mild  . Melanoma ([place]) 02/21/2016   L prox med plantar great toe - Breslow's 1.35mm, Clark level early 4  . Rash    occasionally and pt does see a dermatologist  . Sleep apnea    uses a CPAP;sleep study >16yrs ago   Past Surgical History:  Procedure  Laterality Date  . TOE SURGERY     melanoma  . 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 VIDEO ASSISTED THORACOSCOPY (VATS)/DECORTICATION Left 05/06/2012   Procedure: VIDEO ASSISTED THORACOSCOPY (VATS) drainage loculated pleural effusion;  Surgeon: Grace Isaac, MD;  Location: [facility];  Service: Thoracic;  Laterality: Left;  Marland Kitchen VIDEO BRONCHOSCOPY N/A 05/06/2012   Procedure: VIDEO BRONCHOSCOPY;  Surgeon: Grace Isaac, MD;  Location: [facility] OR;  Service: Thoracic;  Laterality: N/A;   Social History   Socioeconomic History  . Marital status: Married    Spouse name: Not on file  . Number of children:  Not on file  . Years of education: tech school  . Highest education level: High school graduate  Occupational History  . Occupation: retired  Tobacco Use  . Smoking status: Former Smoker    Packs/day: 1.00    Years: 26.00    Pack years: 26.00    Types: Cigarettes    Quit date: 03/26/1989    Years since quitting: 30.8  . Smokeless tobacco: Former Systems developer  . Tobacco comment: less than 1 PPD  Vaping Use  . Vaping Use: Never used  Substance and Sexual Activity  . Alcohol use: No    Alcohol/week: 0.0 standard drinks  . Drug use: No  . Sexual activity: Yes  Other Topics Concern  . Not on file  Social History Narrative  . Not on file   Social Determinants of Health   Financial Resource Strain:   . Difficulty of Paying Living Expenses: Not on file  Food Insecurity:   . Worried About Charity fundraiser in the Last Year: Not on file  . Ran Out of Food in the Last Year: Not on file  Transportation Needs:   . Lack of Transportation (Medical): Not on file  . Lack of Transportation (Non-Medical): Not on file  Physical Activity:   . Days of Exercise per Week: Not on file  . Minutes of Exercise per Session: Not on file  Stress:   . Feeling of Stress : Not on file  Social Connections:   . Frequency of Communication with Friends and Family: Not on file  . Frequency of Social Gatherings with Friends and Family: Not on file  . Attends Religious Services: Not on file  . Active Member of Clubs or Organizations: Not on file  . Attends Archivist Meetings: Not on file  . Marital Status: Not on file  Intimate Partner Violence:   . Fear of Current or Ex-Partner: Not on file  . Emotionally Abused: Not on file  . Physically Abused: Not on file  . Sexually Abused: Not on file   Family History  Problem Relation Age of Onset  . Hypertension Mother   . Stroke Mother        possible  . Cancer Brother    Current Outpatient Medications on File Prior to Visit  Medication Sig  .  aspirin EC 81 MG tablet Take 81 mg by mouth.  . pimecrolimus (ELIDEL) 1 % cream APPLY TO AFFECTED AREA GROIN TWICE DAILY AS DIRECTED   No current facility-administered medications on file prior to visit.    Review of Systems  Constitutional: Negative for activity change, appetite change, chills, diaphoresis, fatigue and fever.  HENT: Negative for congestion and hearing loss.   Eyes: Negative for visual disturbance.  Respiratory: Negative for apnea, cough, chest tightness, shortness of breath and wheezing.   Cardiovascular: Negative for chest pain, palpitations and  leg swelling.  Gastrointestinal: Negative for abdominal pain, constipation, diarrhea, nausea and vomiting.  Endocrine: Negative for cold intolerance.  Genitourinary: Negative for difficulty urinating, dysuria, frequency and hematuria.  Musculoskeletal: Negative for arthralgias and neck pain.  Skin: Negative for rash.  Allergic/Immunologic: Negative for environmental allergies.  Neurological: Negative for dizziness, weakness, light-headedness, numbness and headaches.  Hematological: Negative for adenopathy.  Psychiatric/Behavioral: Negative for behavioral problems, dysphoric mood and sleep disturbance.   Per HPI unless specifically indicated above      Objective:    BP (!) 120/58   Pulse (!) 59   Temp 97.6 F (36.4 C) (Temporal)   Resp 16   Ht 5\' 9"  (1.753 m)   Wt 249 lb 9.6 oz (113.2 kg)   SpO2 98%   BMI 36.86 kg/m   Wt Readings from Last 3 Encounters:  01/12/20 249 lb 9.6 oz (113.2 kg)  10/05/19 249 lb (112.9 kg)  10/01/19 246 lb (111.6 kg)    Physical Exam Vitals and nursing note reviewed.  Constitutional:      General: He is not in acute distress.    Appearance: He is well-developed. He is not diaphoretic.     Comments: Well-appearing, comfortable, cooperative  HENT:     Head: Normocephalic and atraumatic.  Eyes:     General:        Right eye: No discharge.        Left eye: No discharge.      Conjunctiva/sclera: Conjunctivae normal.     Pupils: Pupils are equal, round, and reactive to light.  Neck:     Thyroid: No thyromegaly.     Vascular: No carotid bruit.  Cardiovascular:     Rate and Rhythm: Normal rate and regular rhythm.     Heart sounds: Normal heart sounds. No murmur heard.   Pulmonary:     Effort: Pulmonary effort is normal. No respiratory distress.     Breath sounds: Normal breath sounds. No wheezing or rales.  Abdominal:     General: Bowel sounds are normal. There is no distension.     Palpations: Abdomen is soft. There is no mass.     Tenderness: There is no abdominal tenderness.  Musculoskeletal:        General: No tenderness. Normal range of motion.     Cervical back: Normal range of motion and neck supple.     Right lower leg: No edema.     Left lower leg: No edema.     Comments: Upper / Lower Extremities: - Normal muscle tone, strength bilateral upper extremities 5/5, lower extremities 5/5  Lymphadenopathy:     Cervical: No cervical adenopathy.  Skin:    General: Skin is warm and dry.     Findings: No erythema or rash.  Neurological:     Mental Status: He is alert and oriented to person, place, and time.     Comments: Distal sensation intact to light touch all extremities  Psychiatric:        Behavior: Behavior normal.     Comments: Well groomed, good eye contact, normal speech and thoughts      Diabetic Foot Exam - Simple   Simple Foot Form Diabetic Foot exam was performed with the following findings: Yes 01/12/2020  9:43 AM  Visual Inspection See comments: Yes Sensation Testing Intact to touch and monofilament testing bilaterally: Yes Pulse Check Posterior Tibialis and Dorsalis pulse intact bilaterally: Yes Comments Left great toe s/p amputation of digit, well healed. Varicose veins spider veins in  feet, mild callus formation heels, no ulceration. Intact monofilament.     Results for orders placed or performed in visit on 01/12/20    POCT glycosylated hemoglobin (Hb A1C)  Result Value Ref Range   Hemoglobin A1C 6.7 (A) 4.0 - 5.6 %    Recent Labs    06/12/19 0934 01/12/20 1004  HGBA1C 6.7* 6.7*       Assessment & Plan:   Problem List Items Addressed This Visit    Type 2 diabetes mellitus with other specified complication ([place])    Controlled type 2 diabetes, A1c 6.7 today, still stable at goal < 7-8 No hypoglycemia or hyperglycemia Complications - hyperlipidemia, OSA - increases risk of future cardiovascular complications   Plan:  - Not on medication for DM, it is diet controlled - Encourage improved lifestyle - low carb, low sugar diet, reduce portion size, continue improving regular exercise - May continue chronic case management for comprehensive nursing education on diabetes among other benefits - Continue ASA, ACEi, Statin DM Foot today Future DM Eye Dr Terrilee Files 12/2019      Relevant Medications   lisinopril (ZESTRIL) 30 MG tablet   rosuvastatin (CRESTOR) 10 MG tablet   Other Relevant Orders   POCT glycosylated hemoglobin (Hb A1C) (Completed)   OSA on CPAP    Well controlled, chronic mild-mod OSA on CPAP - Good adherence to CPAP nightly - Continue current CPAP therapy, patient is benefiting from therapy      Malignant neoplasm of prostate ([place])    Followed by Dr Esperanza Heir Onc S/p rad therapy, no surgical removal Stable monitoring PSA per Oncology       Hyperlipidemia associated with type 2 diabetes mellitus ([place])    Controlled cholesterol on lifestyle Last lipid panel 12/2019 Calculated ASCVD 10 yr risk score elevated at 19%, due to age, HTN, among other risk factors  Plan: 1. Continue Rosuvastatin 10mg  nightly 2. Continue ASA 81mg  for primary ASCVD risk reduction 3. Encourage improved lifestyle - low carb/cholesterol, reduce portion size, continue improving regular exercise      Relevant Medications   lisinopril (ZESTRIL) 30 MG tablet   rosuvastatin (CRESTOR) 10 MG tablet    History of amputation of left great toe (HCC)    S/p L great toe amp due to melanoma      Essential hypertension    Controlled - Home BP readings not available No known complications  OFF Furosemide    Plan:  1. Continue HCTZ 12.5mg  daily for HTN and also likely for edema, continue Lisinopril 30mg  daily 2. Encourage improved lifestyle - low sodium diet, improve regular exercise 3. Continue monitor BP outside office, bring readings to next visit, if persistently >140/90 or new symptoms notify office sooner      Relevant Medications   hydrochlorothiazide (HYDRODIURIL) 12.5 MG tablet   lisinopril (ZESTRIL) 30 MG tablet   rosuvastatin (CRESTOR) 10 MG tablet    Other Visit Diagnoses    Annual physical exam    -  Primary   Pure hypercholesterolemia       Relevant Medications   hydrochlorothiazide (HYDRODIURIL) 12.5 MG tablet   lisinopril (ZESTRIL) 30 MG tablet   rosuvastatin (CRESTOR) 10 MG tablet   Needs flu shot       Relevant Orders   Flu Vaccine QUAD High Dose(Fluad) (Completed)      Updated Health Maintenance information - High dose Flu Shot today - Will call us with COVID vaccine card updates, [employer] Reviewed recent lab results with patient Encouraged  improvement to lifestyle with diet and exercise - Goal of weight loss   Meds ordered this encounter  Medications  . hydrochlorothiazide (HYDRODIURIL) 12.5 MG tablet    Sig: Take 1 tablet (12.5 mg total) by mouth daily.    Dispense:  90 tablet    Refill:  3    Add extra refills for future.  Marland Kitchen lisinopril (ZESTRIL) 30 MG tablet    Sig: Take 1 tablet (30 mg total) by mouth daily.    Dispense:  90 tablet    Refill:  3    Add extra refills for future.  . rosuvastatin (CRESTOR) 10 MG tablet    Sig: Take 1 tablet (10 mg total) by mouth at bedtime.    Dispense:  90 tablet    Refill:  3    Add extra refills for future.      Follow up plan: Return in about 6 months (around 07/12/2020) for 6 month DM A1c  HTN.  [name], DO [facility] 01/12/2020, 9:30 AM

## 2020-01-12 NOTE — Telephone Encounter (Signed)
Documented

## 2020-01-12 NOTE — Patient Instructions (Addendum)
Thank you for coming to the office today.  Please call to schedule Dr Atilano Median Diabetic Eye Exam in [facility] when ready.  Please call the main office number [phone], and leave message for our office with the [place] vaccine dates of first and second dose. Thanks  COVID Vaccine Information  You can get booster dose at [facility], [facility] - check their websites or call to schedule in advance. Appointment is required.  [place] Vaccine Information  [email or web address]  Appointments are required. To register for your free vaccination appointment, click the link on the date on the calendar located on the website or call [phone].  NOTICE ON BOOSTERS Chief Operating Officer Now Available to Eligible Populations [place] is now offering at all [employer] vaccination clinics a Pfizer booster dose to the following eligible populations, six months after receiving the second dose of the Pfizer vaccine:  People ages 1 and older and residents in long-term care settings should receive it. People ages 22 to 32 with underlying medical conditions should receive it. People ages 70 to 51 with underlying medical conditions may receive it, based on individual benefits and risks People ages 74 to 38 who are at increased risk of occupational exposure and transmission, such as health care workers, may receive it, based on individual benefits and risks Appointments are required. To register, click the link on the date in the calendar above or call [phone], Monday-Friday, 7 a.m.-7 p.m.   Please schedule a Follow-up Appointment to: Return in about 6 months (around 07/12/2020) for 6 month DM A1c HTN.  If you have any other questions or concerns, please feel free to call the office or send a message through [place]. You may also schedule an earlier appointment if necessary.  Additionally, you may be receiving a survey about your  experience at our office within a few days to 1 week by e-mail or mail. We value your feedback.  [name], DO [place]

## 2020-01-12 NOTE — Telephone Encounter (Signed)
Copied from [place] [id number]. Topic: General - Other >> Jan 12, 2020 10:41 AM Rainey Pines A wrote: Patient called to advise pcp of covid 19 vaccine dates 1st dose- 05/19/19 and 2nd dose-06/09/19 of the pfizer vaccine. Please advise

## 2020-01-13 NOTE — Assessment & Plan Note (Signed)
Controlled - Home BP readings not available No known complications  OFF Furosemide    Plan:  1. Continue HCTZ 12.5mg  daily for HTN and also likely for edema, continue Lisinopril 30mg  daily 2. Encourage improved lifestyle - low sodium diet, improve regular exercise 3. Continue monitor BP outside office, bring readings to next visit, if persistently >140/90 or new symptoms notify office sooner

## 2020-01-13 NOTE — Assessment & Plan Note (Signed)
Followed by Dr Esperanza Heir Onc S/p rad therapy, no surgical removal Stable monitoring PSA per Oncology

## 2020-01-13 NOTE — Assessment & Plan Note (Signed)
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 

## 2020-01-13 NOTE — Assessment & Plan Note (Signed)
Controlled cholesterol on lifestyle Last lipid panel 12/2019 Calculated ASCVD 10 yr risk score elevated at 19%, due to age, HTN, among other risk factors  Plan: 1. Continue Rosuvastatin 10mg  nightly 2. Continue ASA 81mg  for primary ASCVD risk reduction 3. Encourage improved lifestyle - low carb/cholesterol, reduce portion size, continue improving regular exercise

## 2020-01-13 NOTE — Assessment & Plan Note (Signed)
S/p L great toe amp due to melanoma

## 2020-01-13 NOTE — Assessment & Plan Note (Addendum)
Controlled type 2 diabetes, A1c 6.7 today, still stable at goal < 7-8 No hypoglycemia or hyperglycemia Complications - hyperlipidemia, OSA - increases risk of future cardiovascular complications   Plan:  - Not on medication for DM, it is diet controlled - Encourage improved lifestyle - low carb, low sugar diet, reduce portion size, continue improving regular exercise - May continue chronic case management for comprehensive nursing education on diabetes among other benefits - Continue ASA, ACEi, Statin DM Foot today Future DM Eye Dr Terrilee Files 12/2019

## 2020-01-18 LAB — HM DIABETES EYE EXAM

## 2020-02-01 ENCOUNTER — Telehealth: Payer: Self-pay | Admitting: Family Medicine

## 2020-02-01 NOTE — Telephone Encounter (Signed)
Copied from [place] [id number]. Topic: Medicare AWV >> Feb 01, 2020 10:12 AM Cher Nakai R wrote: Reason for CRM:   No answer unable to leave message for patient to call back and schedule the Medicare Annual Wellness Visit (AWV) virtually.  Last AWV 01/13/2019  Please schedule at anytime with [facility].  45 minute appointment  Any questions, please call me at [phone]

## 2020-02-12 ENCOUNTER — Other Ambulatory Visit: Payer: Self-pay | Admitting: Family Medicine

## 2020-02-12 DIAGNOSIS — I10 Essential (primary) hypertension: Secondary | ICD-10-CM

## 2020-02-17 ENCOUNTER — Telehealth: Payer: Self-pay | Admitting: Family Medicine

## 2020-02-17 NOTE — Telephone Encounter (Signed)
Already left detail message but just in case he calls back please reply.

## 2020-02-17 NOTE — Telephone Encounter (Signed)
Spoke to the pharmacy he was looking at old Rx bottle --left message that his Rx was send in 12/2019 for 90 days with three refill so he should be good for entire year.

## 2020-02-17 NOTE — Telephone Encounter (Signed)
Pt called in for assistance pt says that he was told by his pharmacy that he has no more refills on file for rosuvastatin (CRESTOR) 10 MG tablet. Showing that pt should have refills on file. Pt would like to know if office could call pharmacy to update. Pt says that he has 6 pills left.      Please assist.

## 2020-04-07 ENCOUNTER — Other Ambulatory Visit: Payer: Self-pay | Admitting: *Deleted

## 2020-04-07 DIAGNOSIS — C61 Malignant neoplasm of prostate: Secondary | ICD-10-CM

## 2020-04-07 DIAGNOSIS — G4733 Obstructive sleep apnea (adult) (pediatric): Secondary | ICD-10-CM | POA: Diagnosis not present

## 2020-04-08 ENCOUNTER — Inpatient Hospital Stay: Payer: PPO | Admitting: Oncology

## 2020-04-08 ENCOUNTER — Inpatient Hospital Stay: Payer: PPO | Attending: Oncology

## 2020-04-08 ENCOUNTER — Inpatient Hospital Stay: Payer: PPO

## 2020-04-08 VITALS — BP 131/79 | HR 73 | Temp 98.0°F | Wt 243.1 lb

## 2020-04-08 DIAGNOSIS — Z79818 Long term (current) use of other agents affecting estrogen receptors and estrogen levels: Secondary | ICD-10-CM | POA: Diagnosis not present

## 2020-04-08 DIAGNOSIS — C61 Malignant neoplasm of prostate: Secondary | ICD-10-CM

## 2020-04-08 DIAGNOSIS — Z5181 Encounter for therapeutic drug level monitoring: Secondary | ICD-10-CM

## 2020-04-08 DIAGNOSIS — Z5111 Encounter for antineoplastic chemotherapy: Secondary | ICD-10-CM | POA: Insufficient documentation

## 2020-04-08 LAB — COMPREHENSIVE METABOLIC PANEL
ALT: 24 U/L (ref 0–44)
AST: 29 U/L (ref 15–41)
Albumin: 4.1 g/dL (ref 3.5–5.0)
Alkaline Phosphatase: 56 U/L (ref 38–126)
Anion gap: 6 (ref 5–15)
BUN: 18 mg/dL (ref 8–23)
CO2: 30 mmol/L (ref 22–32)
Calcium: 9 mg/dL (ref 8.9–10.3)
Chloride: 100 mmol/L (ref 98–111)
Creatinine, Ser: 1.09 mg/dL (ref 0.61–1.24)
GFR, Estimated: >60 mL/min (ref >60)
Glucose, Bld: 161 mg/dL — ABNORMAL HIGH (ref 70–99)
Potassium: 3.7 mmol/L (ref 3.5–5.1)
Sodium: 136 mmol/L (ref 135–145)
Total Bilirubin: 0.6 mg/dL (ref 0.3–1.2)
Total Protein: 6.9 g/dL (ref 6.5–8.1)

## 2020-04-08 LAB — CBC WITH DIFFERENTIAL/PLATELET
Abs Immature Granulocytes: 0.03 10*3/uL (ref 0.00–0.07)
Basophils Absolute: 0 10*3/uL (ref 0.0–0.1)
Basophils Relative: 1 %
Eosinophils Absolute: 0.3 10*3/uL (ref 0.0–0.5)
Eosinophils Relative: 4 %
HCT: 41.1 % (ref 39.0–52.0)
Hemoglobin: 14.1 g/dL (ref 13.0–17.0)
Immature Granulocytes: 0 %
Lymphocytes Relative: 21 %
Lymphs Abs: 1.5 10*3/uL (ref 0.7–4.0)
MCH: 30.3 pg (ref 26.0–34.0)
MCHC: 34.3 g/dL (ref 30.0–36.0)
MCV: 88.4 fL (ref 80.0–100.0)
Monocytes Absolute: 0.5 10*3/uL (ref 0.1–1.0)
Monocytes Relative: 7 %
Neutro Abs: 4.7 10*3/uL (ref 1.7–7.7)
Neutrophils Relative %: 67 %
Platelets: 223 10*3/uL (ref 150–400)
RBC: 4.65 MIL/uL (ref 4.22–5.81)
RDW: 12.6 % (ref 11.5–15.5)
WBC: 7 10*3/uL (ref 4.0–10.5)
nRBC: 0 % (ref 0.0–0.2)

## 2020-04-08 LAB — PSA: Prostatic Specific Antigen: 1.6 ng/mL (ref 0.00–4.00)

## 2020-04-08 MED ORDER — LEUPROLIDE ACETATE (6 MONTH) 45 MG ~~LOC~~ KIT
45.0000 mg | PACK | Freq: Once | SUBCUTANEOUS | Status: AC
  Administered 2020-04-08: 45 mg via SUBCUTANEOUS
  Filled 2020-04-08: qty 45

## 2020-04-08 NOTE — Progress Notes (Signed)
Hematology/Oncology Consult note [facility]  [phone] [phone]  Patient Care Team: Olin Hauser, DO as PCP - General (Family Medicine) Luan Pulling Ronelle Nigh., MD (Inactive) (Unknown Physician Specialty) Grace Isaac, MD as Consulting Physician (Cardiothoracic Surgery) Clance, Armando Reichert, MD as Consulting Physician (Pulmonary Disease) Grace Isaac, MD as Consulting Physician (Cardiothoracic Surgery) Clance, Armando Reichert, MD as Consulting Physician (Pulmonary Disease) [place], Virl Diamond, [facility] as Pharmacist Minor, Dalbert Garnet, RN (Inactive) as Case Manager   Name of the patient: Eric Stone  UB:[id number]  22-Apr-1945   Date of visit: 04/08/20  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  He has not undergone the surgery for his prostate but has seen Dr. Bernardo Heater in the past.  He was receiving intermittent ADT and last received 45 mg of Eligard on 09/30/2018.  His PSA following that went down to 0.87 December 2020.  He did not receive any ADT after that and his most recent PSA on 09/24/2019 was elevated at 5.08.  Patient reports having significant hot flashes and fatigue when he gets ADT.   Patient presently on continuous ADT since July 2021 which she gets every 6 months.  Bone scan did not show any evidence of metastatic disease.  Interval history-reports tolerating Lupron well.  He does have some mild fatigue and on and off hot flashes which are overall self-limited.  ECOG PS- 1 Pain scale- 0   Review of systems- Review of Systems  Constitutional: Positive for malaise/fatigue. Negative for chills, fever and weight loss.  HENT: Negative for  congestion, ear discharge and nosebleeds.   Eyes: Negative for blurred vision.  Respiratory: Negative for cough, hemoptysis, sputum production, shortness of breath and wheezing.   Cardiovascular: Negative for chest pain, palpitations, orthopnea and claudication.  Gastrointestinal: Negative for abdominal pain, blood in stool, constipation, diarrhea, heartburn, melena, nausea and vomiting.  Genitourinary: Negative for dysuria, flank pain, frequency, hematuria and urgency.  Musculoskeletal: Negative for back pain, joint pain and myalgias.  Skin: Negative for rash.  Neurological: Negative for dizziness, tingling, focal weakness, seizures, weakness and headaches.  Endo/Heme/Allergies: Does not bruise/bleed easily.       Hot flashes  Psychiatric/Behavioral: Negative for depression and suicidal ideas. The patient does not have insomnia.       Allergies  Allergen Reactions  . Penicillins Other (See Comments)    As a child     Past Medical History:  Diagnosis Date  . Arthritis   . H/O blood clots   . H/O blood clots    in left leg in 1988  . Hx of dysplastic nevus 06/18/2013   L lat ankle - mild  . Melanoma ([place]) 02/21/2016   L prox med plantar great toe - Breslow's 1.12mm, Clark level early 4  . Rash    occasionally and pt does see a dermatologist  . Sleep apnea    uses a CPAP;sleep study >43yrs ago     Past Surgical History:  Procedure Laterality Date  . TOE SURGERY     melanoma  . 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 VIDEO ASSISTED THORACOSCOPY (VATS)/DECORTICATION Left 05/06/2012   Procedure: VIDEO ASSISTED THORACOSCOPY (VATS) drainage loculated pleural effusion;  Surgeon: Grace Isaac, MD;  Location: [facility] OR;  Service: Thoracic;  Laterality: Left;  Marland Kitchen VIDEO BRONCHOSCOPY N/A 05/06/2012   Procedure: VIDEO BRONCHOSCOPY;  Surgeon: Grace Isaac, MD;  Location: [facility] OR;  Service: Thoracic;  Laterality: N/A;    Social History   Socioeconomic History  .  Marital status: Married    Spouse name: Not on file  . Number of children: Not on file  . Years of education: tech school  . Highest education level: High school graduate  Occupational History  . Occupation: retired  Tobacco Use  . Smoking status: Former Smoker    Packs/day: 1.00    Years: 26.00    Pack years: 26.00    Types: Cigarettes    Quit date: 03/26/1989    Years since quitting: 31.0  . Smokeless tobacco: Former Systems developer  . Tobacco comment: less than 1 PPD  Vaping Use  . Vaping Use: Never used  Substance and Sexual Activity  . Alcohol use: No    Alcohol/week: 0.0 standard drinks  . Drug use: No  . Sexual activity: Yes  Other Topics Concern  . Not on file  Social History Narrative  . Not on file   Social Determinants of Health   Financial Resource Strain: Not on file  Food Insecurity: Not on file  Transportation Needs: Not on file  Physical Activity: Not on file  Stress: Not on file  Social Connections: Not on file  Intimate Partner Violence: Not on file    Family History  Problem Relation Age of Onset  . Hypertension Mother   . Stroke Mother        possible  . Cancer Brother      Current Outpatient Medications:  .  aspirin EC 81 MG tablet, Take 81 mg by mouth., Disp: , Rfl:  .  hydrochlorothiazide (HYDRODIURIL) 12.5 MG tablet, Take 1 tablet (12.5 mg total) by mouth daily., Disp: 90 tablet, Rfl: 3 .  lisinopril (ZESTRIL) 30 MG tablet, Take 1 tablet (30 mg total) by mouth daily., Disp: 90 tablet, Rfl: 3 .  pimecrolimus (ELIDEL) 1 % cream, APPLY TO AFFECTED AREA GROIN TWICE DAILY AS DIRECTED, Disp: , Rfl:  .  rosuvastatin (CRESTOR) 10 MG tablet, Take 1 tablet (10 mg total) by mouth at bedtime., Disp: 90 tablet, Rfl: 3  Physical exam:  Vitals:   04/08/20 0926  BP: 131/79  Pulse: 73  Temp: 98 F (36.7 C)  TempSrc: Tympanic  SpO2: 97%  Weight: 243 lb 1.6 oz (110.3 kg)   Physical Exam Constitutional:      General: He is not in acute distress. Eyes:      Extraocular Movements: EOM normal.  Cardiovascular:     Rate and Rhythm: Normal rate and regular rhythm.     Heart sounds: Normal heart sounds.  Pulmonary:     Effort: Pulmonary effort is normal.     Breath sounds: Normal breath sounds.  Abdominal:     General: Bowel sounds are normal.     Palpations: Abdomen is soft.  Skin:    General: Skin is warm and dry.  Neurological:     Mental Status: He is alert and oriented to person, place, and time.      CMP Latest Ref Rng & Units 04/08/2020  Glucose 70 - 99 mg/dL 161(H)  BUN 8 - 23 mg/dL 18  Creatinine 0.61 - 1.24 mg/dL 1.09  Sodium 135 - 145 mmol/L 136  Potassium 3.5 - 5.1 mmol/L 3.7  Chloride 98 - 111 mmol/L 100  CO2  22 - 32 mmol/L 30  Calcium 8.9 - 10.3 mg/dL 9.0  Total Protein 6.5 - 8.1 g/dL 6.9  Total Bilirubin 0.3 - 1.2 mg/dL 0.6  Alkaline Phos 38 - 126 U/L 56  AST 15 - 41 U/L 29  ALT 0 - 44 U/L 24   CBC Latest Ref Rng & Units 04/08/2020  WBC 4.0 - 10.5 K/uL 7.0  Hemoglobin 13.0 - 17.0 g/dL 14.1  Hematocrit 39.0 - 52.0 % 41.1  Platelets 150 - 400 K/uL 223      Assessment and plan- Patient is a 75 y.o. male with castrate sensitive nonmetastatic prostate cancer with biochemical recurrence on Lupron here for routine follow-up  PSA from today is pending.  He first received his Lupron in July 2021 and 3 months following that his PSA dropped from 5-1.72.  Bone scan showed no evidence of metastatic disease.  My plan is to continue giving ADT until his PSA reaches nadir.  Following that intermittent ADT could be considered as well.  Patient is tolerating Lupron therapy well with mild self-limited hot flashes and some fatigue   Visit Diagnosis 1. Malignant neoplasm of prostate ([place])   2. Encounter for monitoring Lupron therapy      Dr. Randa Evens, MD, MPH [facility] [phone] 04/08/2020 12:50 PM

## 2020-04-09 LAB — TESTOSTERONE: Testosterone: 18 ng/dL — ABNORMAL LOW (ref 264–916)

## 2020-04-11 ENCOUNTER — Encounter: Payer: PPO | Admitting: Dermatology

## 2020-05-24 ENCOUNTER — Encounter: Payer: Self-pay | Admitting: Family Medicine

## 2020-05-24 ENCOUNTER — Ambulatory Visit (INDEPENDENT_AMBULATORY_CARE_PROVIDER_SITE_OTHER): Payer: PPO | Admitting: Family Medicine

## 2020-05-24 ENCOUNTER — Other Ambulatory Visit: Payer: Self-pay

## 2020-05-24 VITALS — BP 135/67 | HR 60 | Ht 71.0 in | Wt 240.8 lb

## 2020-05-24 DIAGNOSIS — G8929 Other chronic pain: Secondary | ICD-10-CM | POA: Diagnosis not present

## 2020-05-24 DIAGNOSIS — M5136 Other intervertebral disc degeneration, lumbar region: Secondary | ICD-10-CM | POA: Diagnosis not present

## 2020-05-24 DIAGNOSIS — M545 Low back pain, unspecified: Secondary | ICD-10-CM | POA: Diagnosis not present

## 2020-05-24 DIAGNOSIS — M51369 Other intervertebral disc degeneration, lumbar region without mention of lumbar back pain or lower extremity pain: Secondary | ICD-10-CM | POA: Insufficient documentation

## 2020-05-24 NOTE — Progress Notes (Signed)
Subjective:    Patient ID: Eric Daft., male    DOB: [birth year], 75 y.o.   MRN: [id number]  Eric Macha. is a 75 y.o. male presenting on 05/24/2020 for Back Pain   HPI   LOW BACK PAIN, Chronic - Reports symptoms started about 2 weeks ago with twisting lifting inciting injury. Caused pain initially LEFT worse than Right. He has chronic low back pain history for >20-25 years in past. He has not seen orthopedic and has not had imaging or other therapy in past. - Today seems to be much improved gradually. - Describes pain as a sharper pain, moderate to severe pain with intermittent worsening. Worse with bending forward. Improved with sitting in recliner and elevation. No pain radiating to legs. - Taking Ibuprofen OTC 200mg  x 2 tabs, 3 times a day, episodic intermittent dosing. - Tried heating pad at night, ice, muscle No confirmed prior history of osteoarthritis of spine, and no prior back surgery. Typical flares similar to this will respond within 1 week with ibuprofen. - Denies any fevers/chills, numbness, tingling, weakness, loss of control bladder/bowel incontinence or retention, unintentional wt loss, night sweats   Depression screen [facility] 2/9 01/12/2020 06/12/2019 01/13/2019  Decreased Interest 0 0 0  Down, Depressed, Hopeless 0 0 0  PHQ - 2 Score 0 0 0  Altered sleeping - - 0  Tired, decreased energy - - 0  Change in appetite - - 0  Feeling bad or failure about yourself  - - 0  Trouble concentrating - - 0  Moving slowly or fidgety/restless - - 0  Suicidal thoughts - - 0  PHQ-9 Score - - 0    Social History   Tobacco Use   Smoking status: Former Smoker    Packs/day: 1.00    Years: 26.00    Pack years: 26.00    Types: Cigarettes    Quit date: 03/26/1989    Years since quitting: 31.1   Smokeless tobacco: Former Systems developer   Tobacco comment: less than 1 PPD  Vaping Use   Vaping Use: Never used  Substance Use Topics   Alcohol use: No    Alcohol/week: 0.0 standard  drinks   Drug use: No    Review of Systems Per HPI unless specifically indicated above     Objective:    BP 135/67    Pulse 60    Ht 5\' 11"  (1.803 m)    Wt 240 lb 12.8 oz (109.2 kg)    SpO2 97%    BMI 33.58 kg/m   Wt Readings from Last 3 Encounters:  05/24/20 240 lb 12.8 oz (109.2 kg)  04/08/20 243 lb 1.6 oz (110.3 kg)  01/12/20 249 lb 9.6 oz (113.2 kg)    Physical Exam Vitals and nursing note reviewed.  Constitutional:      General: He is not in acute distress.    Appearance: He is well-developed and well-nourished. He is not diaphoretic.     Comments: Well-appearing, comfortable, cooperative  HENT:     Head: Normocephalic and atraumatic.     Mouth/Throat:     Mouth: Oropharynx is clear and moist.  Eyes:     General:        Right eye: No discharge.        Left eye: No discharge.     Conjunctiva/sclera: Conjunctivae normal.  Neck:     Thyroid: No thyromegaly.  Cardiovascular:     Rate and Rhythm: Normal rate and regular rhythm.  Pulses: Intact distal pulses.     Heart sounds: Normal heart sounds. No murmur heard.   Pulmonary:     Effort: Pulmonary effort is normal. No respiratory distress.     Breath sounds: Normal breath sounds. No wheezing or rales.  Musculoskeletal:        General: No edema. Normal range of motion.     Cervical back: Normal range of motion and neck supple.     Right lower leg: No edema.     Left lower leg: No edema.     Comments: Low Back Inspection: Normal appearance, no spinal deformity, symmetrical. Palpation: No tenderness over spinous processes. Bilateral lumbar paraspinal muscles non-tender and without hypertonicity/spasm. ROM: Full active ROM forward flex / back extension, rotation L/R without discomfort Special Testing: Seated SLR negative for radicular pain bilaterally  Strength: Bilateral hip flex/ext 5/5, knee flex/ext 5/5, ankle dorsiflex/plantarflex 5/5 Neurovascular: intact distal sensation to light touch   Lymphadenopathy:      Cervical: No cervical adenopathy.  Skin:    General: Skin is warm and dry.     Findings: No erythema or rash.  Neurological:     Mental Status: He is alert and oriented to person, place, and time.     Cranial Nerves: No cranial nerve deficit.     Sensory: No sensory deficit.     Motor: No weakness.     Coordination: Coordination normal.  Psychiatric:        Mood and Affect: Mood and affect normal.        Behavior: Behavior normal.     Comments: Well groomed, good eye contact, normal speech and thoughts        Results for orders placed or performed in visit on 04/08/20  Testosterone  Result Value Ref Range   Testosterone 18 (L) 264 - 916 ng/dL  PSA  Result Value Ref Range   Prostatic Specific Antigen 1.60 0.00 - 4.00 ng/mL  Comprehensive metabolic panel  Result Value Ref Range   Sodium 136 135 - 145 mmol/L   Potassium 3.7 3.5 - 5.1 mmol/L   Chloride 100 98 - 111 mmol/L   CO2 30 22 - 32 mmol/L   Glucose, Bld 161 (H) 70 - 99 mg/dL   BUN 18 8 - 23 mg/dL   Creatinine, Ser 1.09 0.61 - 1.24 mg/dL   Calcium 9.0 8.9 - 10.3 mg/dL   Total Protein 6.9 6.5 - 8.1 g/dL   Albumin 4.1 3.5 - 5.0 g/dL   AST 29 15 - 41 U/L   ALT 24 0 - 44 U/L   Alkaline Phosphatase 56 38 - 126 U/L   Total Bilirubin 0.6 0.3 - 1.2 mg/dL   GFR, Estimated >60 >60 mL/min   Anion gap 6 5 - 15  CBC with Differential/Platelet  Result Value Ref Range   WBC 7.0 4.0 - 10.5 K/uL   RBC 4.65 4.22 - 5.81 MIL/uL   Hemoglobin 14.1 13.0 - 17.0 g/dL   HCT 41.1 39.0 - 52.0 %   MCV 88.4 80.0 - 100.0 fL   MCH 30.3 26.0 - 34.0 pg   MCHC 34.3 30.0 - 36.0 g/dL   RDW 12.6 11.5 - 15.5 %   Platelets 223 150 - 400 K/uL   nRBC 0.0 0.0 - 0.2 %   Neutrophils Relative % 67 %   Neutro Abs 4.7 1.7 - 7.7 K/uL   Lymphocytes Relative 21 %   Lymphs Abs 1.5 0.7 - 4.0 K/uL   Monocytes Relative 7 %  Monocytes Absolute 0.5 0.1 - 1.0 K/uL   Eosinophils Relative 4 %   Eosinophils Absolute 0.3 0.0 - 0.5 K/uL   Basophils Relative  1 %   Basophils Absolute 0.0 0.0 - 0.1 K/uL   Immature Granulocytes 0 %   Abs Immature Granulocytes 0.03 0.00 - 0.07 K/uL      Assessment & Plan:   Problem List Items Addressed This Visit    DDD (degenerative disc disease), lumbar   Relevant Orders   Ambulatory referral to Orthopedic Surgery   Chronic left-sided low back pain without sciatica - Primary   Relevant Orders   Ambulatory referral to Orthopedic Surgery      Acute on chronic L>R LBP without associated sciatica. Suspect likely due to muscle spasm/strain, with known strain. But no trauma. In setting of SUSPECTED DDD with known chronic LBP No prior surgery - No red flag symptoms. Negative SLR for radiculopathy - Inadequate conservative therapy   Plan: 1. Referral to [facility] orthopedics for further evaluation, imaging and management, given chronicity of problem. - Reassurance today since acute flare improved, no new acute treatment - Reviewed NSAID use, Tylenol PRN Encouraged use of heating pad 1-2x daily for now then PRN F/u as needed  Orders Placed This Encounter  Procedures   Ambulatory referral to Orthopedic Surgery    Referral Priority:   Routine    Referral Type:   Surgical    Referral Reason:   Specialty Services Required    Requested Specialty:   Orthopedic Surgery    Number of Visits Requested:   1     No orders of the defined types were placed in this encounter.     Follow up plan: Return if symptoms worsen or fail to improve, for back pain.   [name], [facility] 05/24/2020, 11:31 AM

## 2020-05-24 NOTE — Patient Instructions (Addendum)
Thank you for coming to the office today.   1. For your Back Pain - I think that this is due to Muscle Spasms or strain. Related to arthritis in low back 2. No new medicines now 3. If flare up again - Start with anti-inflammatory Ibuprofen 200mg  take 2-3 pills up to max 3 times a day with food for 1 week then as needed 4. May use Tylenol Extra Str 500mg  tabs - may take 1-2 tablets every 6 hours as needed 5. Recommend to start using heating pad on your lower back 1-2x daily for few weeks  Also try a Wedge Seat Cushion to avoid nerve pinching when sitting prolonged period of time.  This pain may take weeks to months to fully resolve, but hopefully it will respond to the medicine initially. All back injuries (small or serious) are slow to heal since we use our back muscles every day. Be careful with turning, twisting, lifting, sitting / standing for prolonged periods, and avoid re-injury.  If your symptoms significantly worsen with more pain, or new symptoms with weakness in one or both legs, new or different shooting leg pains, numbness in legs or groin, loss of control or retention of urine or bowel movements, please call back for advice and you may need to go directly to the Emergency Department.   Referral to [facility] [place], [place]  [id number] Phone: [phone]  They will call you with apt.  Please schedule a Follow-up Appointment to: Return if symptoms worsen or fail to improve, for back pain.  If you have any other questions or concerns, please feel free to call the office or send a message through [place]. You may also schedule an earlier appointment if necessary.  Additionally, you may be receiving a survey about your experience at our office within a few days to 1 week by e-mail or mail. We value your feedback.  [name], DO [place]

## 2020-06-06 DIAGNOSIS — M545 Low back pain, unspecified: Secondary | ICD-10-CM | POA: Diagnosis not present

## 2020-07-06 ENCOUNTER — Other Ambulatory Visit: Payer: Self-pay

## 2020-07-06 ENCOUNTER — Ambulatory Visit: Payer: PPO | Admitting: Dermatology

## 2020-07-06 ENCOUNTER — Encounter: Payer: Self-pay | Admitting: Dermatology

## 2020-07-06 DIAGNOSIS — Z85820 Personal history of malignant melanoma of skin: Secondary | ICD-10-CM

## 2020-07-06 DIAGNOSIS — Z1283 Encounter for screening for malignant neoplasm of skin: Secondary | ICD-10-CM

## 2020-07-06 DIAGNOSIS — I8393 Asymptomatic varicose veins of bilateral lower extremities: Secondary | ICD-10-CM | POA: Diagnosis not present

## 2020-07-06 DIAGNOSIS — D229 Melanocytic nevi, unspecified: Secondary | ICD-10-CM

## 2020-07-06 DIAGNOSIS — D1800 Hemangioma unspecified site: Secondary | ICD-10-CM | POA: Diagnosis not present

## 2020-07-06 DIAGNOSIS — L578 Other skin changes due to chronic exposure to nonionizing radiation: Secondary | ICD-10-CM

## 2020-07-06 DIAGNOSIS — Z86018 Personal history of other benign neoplasm: Secondary | ICD-10-CM

## 2020-07-06 DIAGNOSIS — L814 Other melanin hyperpigmentation: Secondary | ICD-10-CM

## 2020-07-06 DIAGNOSIS — L821 Other seborrheic keratosis: Secondary | ICD-10-CM

## 2020-07-06 DIAGNOSIS — G4733 Obstructive sleep apnea (adult) (pediatric): Secondary | ICD-10-CM | POA: Diagnosis not present

## 2020-07-06 NOTE — Progress Notes (Signed)
   Follow-Up Visit   Subjective  Eric Stone. is a 75 y.o. male who presents for the following: Annual Exam (History of Melanoma, Dysplastic nevi - TBSE today). The patient presents for Total-Body Skin Exam (TBSE) for skin cancer screening and mole check.  The following portions of the chart were reviewed this encounter and updated as appropriate:   Tobacco  Allergies  Meds  Problems  Med Hx  Surg Hx  Fam Hx     Review of Systems:  No other skin or systemic complaints except as noted in HPI or Assessment and Plan.  Objective  Well appearing patient in no apparent distress; mood and affect are within normal limits.  A full examination was performed including scalp, head, eyes, ears, nose, lips, neck, chest, axillae, abdomen, back, buttocks, bilateral upper extremities, bilateral lower extremities, hands, feet, fingers, toes, fingernails, and toenails. All findings within normal limits unless otherwise noted below.  Objective  Bilateral legs: Varicose veins   Assessment & Plan    Lentigines - Scattered tan macules - Due to sun exposure - Benign-appering, observe - Recommend daily broad spectrum sunscreen SPF 30+ to sun-exposed areas, reapply every 2 hours as needed. - Call for any changes  Seborrheic Keratoses - Stuck-on, waxy, tan-brown papules and/or plaques  - Benign-appearing - Discussed benign etiology and prognosis. - Observe - Call for any changes  Melanocytic Nevi - Tan-brown and/or pink-flesh-colored symmetric macules and papules - Benign appearing on exam today - Observation - Call clinic for new or changing moles - Recommend daily use of broad spectrum spf 30+ sunscreen to sun-exposed areas.   Hemangiomas - Red papules - Discussed benign nature - Observe - Call for any changes  Actinic Damage - Chronic condition, secondary to cumulative UV/sun exposure - diffuse scaly erythematous macules with underlying dyspigmentation - Recommend daily  broad spectrum sunscreen SPF 30+ to sun-exposed areas, reapply every 2 hours as needed.  - Staying in the shade or wearing long sleeves, sun glasses (UVA+UVB protection) and wide brim hats (4-inch brim around the entire circumference of the hat) are also recommended for sun protection.  - Call for new or changing lesions.  Skin cancer screening performed today.  History of Dysplastic Nevi - No evidence of recurrence today - Recommend regular full body skin exams - Recommend daily broad spectrum sunscreen SPF 30+ to sun-exposed areas, reapply every 2 hours as needed.  - Call if any new or changing lesions are noted between office visits  History of Melanoma - No evidence of recurrence today - No lymphadenopathy - Recommend regular full body skin exams - Recommend daily broad spectrum sunscreen SPF 30+ to sun-exposed areas, reapply every 2 hours as needed.  - Call if any new or changing lesions are noted between office visits  Varicose veins of both lower extremities, unspecified whether complicated Bilateral legs  Benign, observe.     Skin cancer screening  Return in about 1 year (around 07/06/2021) for TBSE.  I, Ashok Cordia, CMA, am acting as scribe for Sarina Ser, MD .  Documentation: I have reviewed the above documentation for accuracy and completeness, and I agree with the above.  Sarina Ser, MD

## 2020-07-06 NOTE — Patient Instructions (Addendum)
If you have any questions or concerns for your doctor, please call our main line at [phone] and press option 4 to reach your doctor's medical assistant. If no one answers, please leave a voicemail as directed and we will return your call as soon as possible. Messages left after 4 pm will be answered the following business day.   You may also send [place] a message via [place]. We typically respond to MyChart messages within 1-2 business days.  For prescription refills, please ask your pharmacy to contact our office. Our fax number is [phone].  If you have an urgent issue when the clinic is closed that cannot wait until the next business day, you can page your doctor at the number below.    Please note that while we do our best to be available for urgent issues outside of office hours, we are not available 24/7.   If you have an urgent issue and are unable to reach [place], you may choose to seek medical care at your doctor's office, retail clinic, urgent care center, or emergency room.  If you have a medical emergency, please immediately call 911 or go to the emergency department.  Pager Numbers  - Dr. Nehemiah Massed: [phone]  - Dr. Laurence Ferrari: [phone]  - Dr. Nicole Kindred: [phone]  In the event of inclement weather, please call our main line at [phone] for an update on the status of any delays or closures.  Dermatology Medication Tips: Please keep the boxes that topical medications come in in order to help keep track of the instructions about where and how to use these. Pharmacies typically print the medication instructions only on the boxes and not directly on the medication tubes.   If your medication is too expensive, please contact our office at [phone] option 4 or send [place] a message through [place].   We are unable to tell what your co-pay for medications will be in advance as this is different depending on your insurance coverage. However, we may be able to find a substitute  medication at lower cost or fill out paperwork to get insurance to cover a needed medication.   If a prior authorization is required to get your medication covered by your insurance company, please allow [place] 1-2 business days to complete this process.  Drug prices often vary depending on where the prescription is filled and some pharmacies may offer cheaper prices.  The website www.goodrx.com contains coupons for medications through different pharmacies. The prices here do not account for what the cost may be with help from insurance (it may be cheaper with your insurance), but the website can give you the price if you did not use any insurance.  - You can print the associated coupon and take it with your prescription to the pharmacy.  - You may also stop by our office during regular business hours and pick up a GoodRx coupon card.  - If you need your prescription sent electronically to a different pharmacy, notify our office through [facility] or by phone at [phone] option 4.  Melanoma ABCDEs  Melanoma is the most dangerous type of skin cancer, and is the leading cause of death from skin disease.  You are more likely to develop melanoma if you:  Have light-colored skin, light-colored eyes, or red or blond hair  Spend a lot of time in the sun  Tan regularly, either outdoors or in a tanning bed  Have had blistering sunburns, especially during childhood  Have a close family  member who has had a melanoma  Have atypical moles or large birthmarks  Early detection of melanoma is key since treatment is typically straightforward and cure rates are extremely high if we catch it early.   The first sign of melanoma is often a change in a mole or a new dark spot.  The ABCDE system is a way of remembering the signs of melanoma.  A for asymmetry:  The two halves do not match. B for border:  The edges of the growth are irregular. C for color:  A mixture of colors are present instead of  an even brown color. D for diameter:  Melanomas are usually (but not always) greater than 64mm - the size of a pencil eraser. E for evolution:  The spot keeps changing in size, shape, and color.  Please check your skin once per month between visits. You can use a small mirror in front and a large mirror behind you to keep an eye on the back side or your body.   If you see any new or changing lesions before your next follow-up, please call to schedule a visit.  Please continue daily skin protection including broad spectrum sunscreen SPF 30+ to sun-exposed areas, reapplying every 2 hours as needed when you're outdoors.   Staying in the shade or wearing long sleeves, sun glasses (UVA+UVB protection) and wide brim hats (4-inch brim around the entire circumference of the hat) are also recommended for sun protection.

## 2020-07-07 ENCOUNTER — Inpatient Hospital Stay: Payer: PPO | Attending: Oncology

## 2020-07-07 DIAGNOSIS — C61 Malignant neoplasm of prostate: Secondary | ICD-10-CM | POA: Diagnosis not present

## 2020-07-07 LAB — CBC WITH DIFFERENTIAL/PLATELET
Abs Immature Granulocytes: 0.01 10*3/uL (ref 0.00–0.07)
Basophils Absolute: 0 10*3/uL (ref 0.0–0.1)
Basophils Relative: 0 %
Eosinophils Absolute: 0.3 10*3/uL (ref 0.0–0.5)
Eosinophils Relative: 4 %
HCT: 38.7 % — ABNORMAL LOW (ref 39.0–52.0)
Hemoglobin: 13.3 g/dL (ref 13.0–17.0)
Immature Granulocytes: 0 %
Lymphocytes Relative: 18 %
Lymphs Abs: 1.3 10*3/uL (ref 0.7–4.0)
MCH: 30.1 pg (ref 26.0–34.0)
MCHC: 34.4 g/dL (ref 30.0–36.0)
MCV: 87.6 fL (ref 80.0–100.0)
Monocytes Absolute: 0.5 10*3/uL (ref 0.1–1.0)
Monocytes Relative: 7 %
Neutro Abs: 5 10*3/uL (ref 1.7–7.7)
Neutrophils Relative %: 71 %
Platelets: 196 10*3/uL (ref 150–400)
RBC: 4.42 MIL/uL (ref 4.22–5.81)
RDW: 13.2 % (ref 11.5–15.5)
WBC: 7.1 10*3/uL (ref 4.0–10.5)
nRBC: 0 % (ref 0.0–0.2)

## 2020-07-07 LAB — COMPREHENSIVE METABOLIC PANEL
ALT: 19 U/L (ref 0–44)
AST: 25 U/L (ref 15–41)
Albumin: 4.4 g/dL (ref 3.5–5.0)
Alkaline Phosphatase: 72 U/L (ref 38–126)
Anion gap: 11 (ref 5–15)
BUN: 17 mg/dL (ref 8–23)
CO2: 25 mmol/L (ref 22–32)
Calcium: 9 mg/dL (ref 8.9–10.3)
Chloride: 103 mmol/L (ref 98–111)
Creatinine, Ser: 1 mg/dL (ref 0.61–1.24)
GFR, Estimated: >60 mL/min (ref >60)
Glucose, Bld: 157 mg/dL — ABNORMAL HIGH (ref 70–99)
Potassium: 3.8 mmol/L (ref 3.5–5.1)
Sodium: 139 mmol/L (ref 135–145)
Total Bilirubin: 0.4 mg/dL (ref 0.3–1.2)
Total Protein: 6.9 g/dL (ref 6.5–8.1)

## 2020-07-07 LAB — PSA: Prostatic Specific Antigen: 1.51 ng/mL (ref 0.00–4.00)

## 2020-07-12 ENCOUNTER — Other Ambulatory Visit: Payer: Self-pay

## 2020-07-12 ENCOUNTER — Ambulatory Visit (INDEPENDENT_AMBULATORY_CARE_PROVIDER_SITE_OTHER): Payer: PPO | Admitting: Family Medicine

## 2020-07-12 ENCOUNTER — Other Ambulatory Visit: Payer: Self-pay | Admitting: Family Medicine

## 2020-07-12 ENCOUNTER — Encounter: Payer: Self-pay | Admitting: Family Medicine

## 2020-07-12 VITALS — BP 124/68 | HR 63 | Temp 96.9°F | Ht 70.0 in | Wt 239.2 lb

## 2020-07-12 DIAGNOSIS — E1169 Type 2 diabetes mellitus with other specified complication: Secondary | ICD-10-CM

## 2020-07-12 DIAGNOSIS — Z9989 Dependence on other enabling machines and devices: Secondary | ICD-10-CM | POA: Diagnosis not present

## 2020-07-12 DIAGNOSIS — C61 Malignant neoplasm of prostate: Secondary | ICD-10-CM

## 2020-07-12 DIAGNOSIS — G8929 Other chronic pain: Secondary | ICD-10-CM | POA: Diagnosis not present

## 2020-07-12 DIAGNOSIS — M5136 Other intervertebral disc degeneration, lumbar region: Secondary | ICD-10-CM

## 2020-07-12 DIAGNOSIS — G4733 Obstructive sleep apnea (adult) (pediatric): Secondary | ICD-10-CM

## 2020-07-12 DIAGNOSIS — I10 Essential (primary) hypertension: Secondary | ICD-10-CM

## 2020-07-12 DIAGNOSIS — Z0000 Encounter for general adult medical examination without abnormal findings: Secondary | ICD-10-CM

## 2020-07-12 DIAGNOSIS — M545 Low back pain, unspecified: Secondary | ICD-10-CM

## 2020-07-12 LAB — POCT GLYCOSYLATED HEMOGLOBIN (HGB A1C): Hemoglobin A1C: 6.6 % — AB (ref 4.0–5.6)

## 2020-07-12 NOTE — Assessment & Plan Note (Signed)
Controlled - Home BP readings not available No known complications  OFF Furosemide    Plan:  1. Continue HCTZ 12.5mg  daily for HTN and also likely for edema, continue Lisinopril 30mg  daily 2. Encourage improved lifestyle - low sodium diet, improve regular exercise 3. Continue monitor BP outside office, bring readings to next visit, if persistently >140/90 or new symptoms notify office sooner

## 2020-07-12 NOTE — Assessment & Plan Note (Signed)
Followed by [place] reviewed from last visit. Moderate advanced DDD Lumbar Limited interventions at last visit, some improvement was reported, however orthopedics can follow-up with him if still not improved.

## 2020-07-12 NOTE — Patient Instructions (Addendum)
Thank you for coming to the office today.  Recent Labs    01/12/20 1004 07/12/20 0819  HGBA1C 6.7* 6.6*    BP Improved  If back still bothering keep trying to contact [employer], for next opinion or other treatment options available to you  Keep up the good work!  DUE for FASTING BLOOD WORK (no food or drink after midnight before the lab appointment, only water or coffee without cream/sugar on the morning of)  SCHEDULE "Lab Only" visit in the morning at the clinic for lab draw in 6 MONTHS   - Make sure Lab Only appointment is at about 1 week before your next appointment, so that results will be available  For Lab Results, once available within 2-3 days of blood draw, you can can log in to MyChart online to view your results and a brief explanation. Also, we can discuss results at next follow-up visit.    Please schedule a Follow-up Appointment to: Return in about 6 months (around 01/11/2021) for 6 month fasting lab only then 1 week later Annual Physical.  If you have any other questions or concerns, please feel free to call the office or send a message through [place]. You may also schedule an earlier appointment if necessary.  Additionally, you may be receiving a survey about your experience at our office within a few days to 1 week by e-mail or mail. We value your feedback.  [name], DO [place]

## 2020-07-12 NOTE — Progress Notes (Signed)
Subjective:    Patient ID: Eric Daft., male    DOB: 25-Jan-1946, 75 y.o.   MRN: [id number]  Eric Zelek. is a 75 y.o. male presenting on 07/12/2020 for Diabetes and Hypertension   HPI  CHRONIC HTN: Controlled. Not checking regularly now. Current Meds - Lisinopril 30mg  daily, HCTZ 12.5mg  daily Reports good compliance, took meds today. Tolerating well, w/o complaints. - Admits mild varicose veins edema, but improvedon compression stocking Denies CP, dyspnea, HA, dizziness / lightheadedness  Type 2 Diabetes Lab result A1c 6.7 on A1c, today due for A1c CBGs Never checked. Meds: neveron meds Currently on ACEi Lifestyle: - Diet (Trying tomaintain improved diet, reduce carbs portions, inc water) - Exercise (Improved activity and exercise) - S/p L Great Toe Amputation Next DM Eye Exam 12/2020 Dr Terrilee Files Denies hypoglycemia, polyuria, visual changes, numbness or tingling.  OSA, on CPAP - Patient reports prior history of dx OSA and on CPAP for >20 years, prior to treatment his initial symptoms were snoring, daytime sleepiness and fatigue, he has had several sleep studies in the past. Most recent 2014/07/16, with AHI 6.8 per hour, and mild to moderate OSA. - He reports that his sleep apnea is well controlled. He uses the CPAP machine every night. He tolerates the machine well, and thinks that he sleeps better with it and feels good. No new concerns or symptoms.  PMH -Melanoma, Left big toe / History of Skin CancerS/p amputation- followed by [facility]  PMH Chronic Low Back Pain, with osteoarthritis Has seen Vance Peper PA - [place], had x-rays, copied below of lumbar spine, has gradually improved, advanced OA/DJD, no other intervention. He can return if worsening.  S/p prostate cancer Followed by Dr Janese Banks and Dr Baruch Gouty for Prostate Cancer - Last lab PSA improved from 1.72 down to 1.6 down to 1.51 (07/07/20) - Upcoming apt then f/u in July 2022 - He is on  injection therapy  Health Maintenance:  - Shingrix UTD x 2 doses at [place] -Colonoscopy 2014previously. Now completed Cologuard 12/2018 next due in 12/2021 - UTD TDap 2016 - Completed routine Hep C screening, Negative 12/2016  - UTD Pneumonia vaccine, received Pneumovax-23 at age 29 (2014), and then next dose was Prevnar-13 12/2013, has received both after age 75, therefore completed  UTD on [place] booster x 1   Depression screen [facility] 2/9 01/12/2020 06/12/2019 01/13/2019  Decreased Interest 0 0 0  Down, Depressed, Hopeless 0 0 0  PHQ - 2 Score 0 0 0  Altered sleeping - - 0  Tired, decreased energy - - 0  Change in appetite - - 0  Feeling bad or failure about yourself  - - 0  Trouble concentrating - - 0  Moving slowly or fidgety/restless - - 0  Suicidal thoughts - - 0  PHQ-9 Score - - 0    Social History   Tobacco Use  . Smoking status: Former Smoker    Packs/day: 1.00    Years: 26.00    Pack years: 26.00    Types: Cigarettes    Quit date: 03/26/1989    Years since quitting: 31.3  . Smokeless tobacco: Former Systems developer  . Tobacco comment: less than 1 PPD  Vaping Use  . Vaping Use: Never used  Substance Use Topics  . Alcohol use: No    Alcohol/week: 0.0 standard drinks  . Drug use: No    Review of Systems Per HPI unless specifically indicated above  Objective:    BP 124/68 (BP Location: Left Arm, Patient Position: Sitting, Cuff Size: Normal)   Pulse 63   Temp (!) 96.9 F (36.1 C) (Temporal)   Ht 5\' 10"  (1.778 m)   Wt 239 lb 3.2 oz (108.5 kg)   SpO2 100%   BMI 34.32 kg/m   Wt Readings from Last 3 Encounters:  07/12/20 239 lb 3.2 oz (108.5 kg)  05/24/20 240 lb 12.8 oz (109.2 kg)  04/08/20 243 lb 1.6 oz (110.3 kg)    Physical Exam Vitals and nursing note reviewed.  Constitutional:      General: He is not in acute distress.    Appearance: He is well-developed. He is not diaphoretic.     Comments: Well-appearing,  comfortable, cooperative  HENT:     Head: Normocephalic and atraumatic.  Eyes:     General:        Right eye: No discharge.        Left eye: No discharge.     Conjunctiva/sclera: Conjunctivae normal.  Neck:     Thyroid: No thyromegaly.  Cardiovascular:     Rate and Rhythm: Normal rate and regular rhythm.     Heart sounds: Normal heart sounds. No murmur heard.   Pulmonary:     Effort: Pulmonary effort is normal. No respiratory distress.     Breath sounds: Normal breath sounds. No wheezing or rales.  Musculoskeletal:        General: Normal range of motion.     Cervical back: Normal range of motion and neck supple.  Lymphadenopathy:     Cervical: No cervical adenopathy.  Skin:    General: Skin is warm and dry.     Findings: No erythema or rash.  Neurological:     Mental Status: He is alert and oriented to person, place, and time.  Psychiatric:        Behavior: Behavior normal.     Comments: Well groomed, good eye contact, normal speech and thoughts      Recent Labs    01/12/20 1004 07/12/20 0819  HGBA1C 6.7* 6.6*   06/06/2020 9:55 AM EDT  2 views of the LS-spine ordered and interpreted on today's visit shows  moderate narrowing of the intervertebral disc space of the lumbar spine.  He is noted to have significant degenerative joint disease throughout the  entire lumbar spine. He actually appears to have autofusion of the lumbar  vertebrae's both on the AP and lateral views.  Results for orders placed or performed in visit on 07/12/20  POCT HgB A1C  Result Value Ref Range   Hemoglobin A1C 6.6 (A) 4.0 - 5.6 %      Assessment & Plan:   Problem List Items Addressed This Visit    Type 2 diabetes mellitus with other specified complication ([place]) - Primary    Controlled type 2 diabetes, A1c 6.6 today, still stable at goal < 7-8 No hypoglycemia or hyperglycemia Complications - hyperlipidemia, OSA - increases risk of future cardiovascular complications   Plan:  -  Not on medication for DM, it is diet controlled - Encourage improved lifestyle - low carb, low sugar diet, reduce portion size, continue improving regular exercise - May continue chronic case management for comprehensive nursing education on diabetes among other benefits - Continue ASA, ACEi, Statin  Future DM Eye Dr Terrilee Files 12/2020, repeat Foot Exam      Relevant Orders   POCT HgB A1C (Completed)   OSA on CPAP    Well controlled,  chronic mild-mod OSA on CPAP - Good adherence to CPAP nightly - Continue current CPAP therapy, patient is benefiting from therapy      Malignant neoplasm of prostate ([place])    Followed by Dr Esperanza Heir Onc S/p rad therapy, no surgical removal Stable monitoring PSA per Oncology Last PSA improved 06/2020 Has upcoming visits 09/2020      Essential hypertension    Controlled - Home BP readings not available No known complications  OFF Furosemide    Plan:  1. Continue HCTZ 12.5mg  daily for HTN and also likely for edema, continue Lisinopril 30mg  daily 2. Encourage improved lifestyle - low sodium diet, improve regular exercise 3. Continue monitor BP outside office, bring readings to next visit, if persistently >140/90 or new symptoms notify office sooner      DDD (degenerative disc disease), lumbar    Followed by [place] reviewed from last visit. Moderate advanced DDD Lumbar Limited interventions at last visit, some improvement was reported, however orthopedics can follow-up with him if still not improved.      Chronic left-sided low back pain without sciatica        No orders of the defined types were placed in this encounter.     Follow up plan: Return in about 6 months (around 01/11/2021) for 6 month fasting lab only then 1 week later Annual Physical.  Future labs ordered for 01/04/21   [name], [facility] 07/12/2020, 8:19 AM

## 2020-07-12 NOTE — Assessment & Plan Note (Signed)
Controlled type 2 diabetes, A1c 6.6 today, still stable at goal < 7-8 No hypoglycemia or hyperglycemia Complications - hyperlipidemia, OSA - increases risk of future cardiovascular complications   Plan:  - Not on medication for DM, it is diet controlled - Encourage improved lifestyle - low carb, low sugar diet, reduce portion size, continue improving regular exercise - May continue chronic case management for comprehensive nursing education on diabetes among other benefits - Continue ASA, ACEi, Statin  Future DM Eye Dr Terrilee Files 12/2020, repeat Foot Exam

## 2020-07-12 NOTE — Assessment & Plan Note (Signed)
Followed by Dr Esperanza Heir Onc S/p rad therapy, no surgical removal Stable monitoring PSA per Oncology Last PSA improved 06/2020 Has upcoming visits 09/2020

## 2020-07-12 NOTE — Assessment & Plan Note (Signed)
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 

## 2020-07-14 ENCOUNTER — Other Ambulatory Visit: Payer: Self-pay

## 2020-07-14 MED ORDER — PIMECROLIMUS 1 % EX CREA: TOPICAL_CREAM | CUTANEOUS | 5 refills | Status: DC

## 2020-09-27 ENCOUNTER — Other Ambulatory Visit: Payer: Self-pay

## 2020-09-27 ENCOUNTER — Inpatient Hospital Stay: Payer: PPO | Attending: Oncology

## 2020-09-27 DIAGNOSIS — Z0000 Encounter for general adult medical examination without abnormal findings: Secondary | ICD-10-CM

## 2020-09-27 DIAGNOSIS — E785 Hyperlipidemia, unspecified: Secondary | ICD-10-CM

## 2020-09-27 DIAGNOSIS — E1169 Type 2 diabetes mellitus with other specified complication: Secondary | ICD-10-CM

## 2020-09-27 DIAGNOSIS — C61 Malignant neoplasm of prostate: Secondary | ICD-10-CM | POA: Diagnosis not present

## 2020-09-27 LAB — COMPREHENSIVE METABOLIC PANEL
ALT: 17 U/L (ref 0–44)
AST: 21 U/L (ref 15–41)
Albumin: 4 g/dL (ref 3.5–5.0)
Alkaline Phosphatase: 66 U/L (ref 38–126)
Anion gap: 11 (ref 5–15)
BUN: 23 mg/dL (ref 8–23)
CO2: 26 mmol/L (ref 22–32)
Calcium: 9.1 mg/dL (ref 8.9–10.3)
Chloride: 101 mmol/L (ref 98–111)
Creatinine, Ser: 1.13 mg/dL (ref 0.61–1.24)
GFR, Estimated: >60 mL/min (ref >60)
Glucose, Bld: 145 mg/dL — ABNORMAL HIGH (ref 70–99)
Potassium: 4 mmol/L (ref 3.5–5.1)
Sodium: 138 mmol/L (ref 135–145)
Total Bilirubin: 0.6 mg/dL (ref 0.3–1.2)
Total Protein: 7.1 g/dL (ref 6.5–8.1)

## 2020-09-27 LAB — CBC WITH DIFFERENTIAL/PLATELET
Abs Immature Granulocytes: 0.02 10*3/uL (ref 0.00–0.07)
Basophils Absolute: 0 10*3/uL (ref 0.0–0.1)
Basophils Relative: 1 %
Eosinophils Absolute: 0.3 10*3/uL (ref 0.0–0.5)
Eosinophils Relative: 4 %
HCT: 38.4 % — ABNORMAL LOW (ref 39.0–52.0)
Hemoglobin: 12.8 g/dL — ABNORMAL LOW (ref 13.0–17.0)
Immature Granulocytes: 0 %
Lymphocytes Relative: 26 %
Lymphs Abs: 1.7 10*3/uL (ref 0.7–4.0)
MCH: 29.6 pg (ref 26.0–34.0)
MCHC: 33.3 g/dL (ref 30.0–36.0)
MCV: 88.7 fL (ref 80.0–100.0)
Monocytes Absolute: 0.5 10*3/uL (ref 0.1–1.0)
Monocytes Relative: 8 %
Neutro Abs: 3.9 10*3/uL (ref 1.7–7.7)
Neutrophils Relative %: 61 %
Platelets: 203 10*3/uL (ref 150–400)
RBC: 4.33 MIL/uL (ref 4.22–5.81)
RDW: 12.9 % (ref 11.5–15.5)
WBC: 6.4 10*3/uL (ref 4.0–10.5)
nRBC: 0 % (ref 0.0–0.2)

## 2020-09-27 LAB — PSA: Prostatic Specific Antigen: 1.98 ng/mL (ref 0.00–4.00)

## 2020-09-30 ENCOUNTER — Ambulatory Visit: Payer: PPO | Admitting: Radiation Oncology

## 2020-10-03 ENCOUNTER — Ambulatory Visit
Admission: RE | Admit: 2020-10-03 | Discharge: 2020-10-03 | Disposition: A | Discharge status: Home / Self Care | Payer: PPO | Source: Ambulatory Visit | Attending: Radiation Oncology | Admitting: Radiation Oncology

## 2020-10-03 ENCOUNTER — Encounter: Payer: Self-pay | Admitting: Radiation Oncology

## 2020-10-03 VITALS — BP 137/71 | HR 65 | Temp 97.0°F | Wt 242.0 lb

## 2020-10-03 DIAGNOSIS — Z8546 Personal history of malignant neoplasm of prostate: Secondary | ICD-10-CM | POA: Diagnosis not present

## 2020-10-03 DIAGNOSIS — Z923 Personal history of irradiation: Secondary | ICD-10-CM | POA: Insufficient documentation

## 2020-10-03 DIAGNOSIS — Z79818 Long term (current) use of other agents affecting estrogen receptors and estrogen levels: Secondary | ICD-10-CM | POA: Diagnosis not present

## 2020-10-03 DIAGNOSIS — Z08 Encounter for follow-up examination after completed treatment for malignant neoplasm: Secondary | ICD-10-CM | POA: Diagnosis not present

## 2020-10-03 DIAGNOSIS — C61 Malignant neoplasm of prostate: Secondary | ICD-10-CM

## 2020-10-03 NOTE — Progress Notes (Signed)
Radiation Oncology Follow up Note  Name: Eric Stone.   Date:   10/03/2020 MRN:  [id number] DOB: 10/31/1945    This 75 y.o. male presents to the clinic today for 6 and half year follow-up status post.  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  He presented with biochemical failure and is currently on Lupron therapy under medical oncology's direction.  He is asymptomatic has no indication on scans of bone metastasis.  He specifically denies any increased lower urinary tract symptoms diarrhea or fatigue.Marland Kitchen  His PSA is fairly stable over the past year.  He does complain of some mild hot flashes and some fatigue.  Patient had a bone scan back 1 year prior showing no evidence of metastatic disease. COMPLICATIONS OF TREATMENT: none  FOLLOW UP COMPLIANCE: keeps appointments   PHYSICAL EXAM:  BP 137/71   Pulse 65   Temp (!) 97 F (36.1 C)   Wt 242 lb (109.8 kg)   BMI 34.72 kg/m  Well-developed well-nourished patient in NAD. HEENT reveals PERLA, EOMI, discs not visualized.  Oral cavity is clear. No oral mucosal lesions are identified. Neck is clear without evidence of cervical or supraclavicular adenopathy. Lungs are clear to A&P. Cardiac examination is essentially unremarkable with regular rate and rhythm without murmur rub or thrill. Abdomen is benign with no organomegaly or masses noted. Motor sensory and DTR levels are equal and symmetric in the upper and lower extremities. Cranial nerves II through XII are grossly intact. Proprioception is intact. No peripheral adenopathy or edema is identified. No motor or sensory levels are noted. Crude visual fields are within normal range.  RADIOLOGY RESULTS: No current films for review  PLAN: Present time I will turn follow-up care over to  medical oncology I be happy to reevaluate the patient in time should further treatment be indicated.  Patient knows to call with any concerns.  He continues androgen deprivation therapy through medical oncology.  I would like to take this opportunity to thank you for allowing me to participate in the care of your patient.Noreene Filbert, MD

## 2020-10-06 ENCOUNTER — Ambulatory Visit: Payer: PPO | Admitting: Oncology

## 2020-10-06 ENCOUNTER — Other Ambulatory Visit: Payer: PPO

## 2020-10-06 ENCOUNTER — Ambulatory Visit: Payer: PPO

## 2020-10-06 DIAGNOSIS — G4733 Obstructive sleep apnea (adult) (pediatric): Secondary | ICD-10-CM | POA: Diagnosis not present

## 2020-10-22 ENCOUNTER — Other Ambulatory Visit: Payer: Self-pay | Admitting: *Deleted

## 2020-10-22 DIAGNOSIS — C61 Malignant neoplasm of prostate: Secondary | ICD-10-CM

## 2020-10-25 ENCOUNTER — Inpatient Hospital Stay: Payer: PPO

## 2020-10-25 ENCOUNTER — Inpatient Hospital Stay: Payer: PPO | Attending: Oncology

## 2020-10-25 ENCOUNTER — Inpatient Hospital Stay: Payer: PPO | Admitting: Oncology

## 2020-10-25 VITALS — BP 135/76 | HR 72 | Temp 96.0°F | Resp 20 | Wt 239.1 lb

## 2020-10-25 DIAGNOSIS — Z5181 Encounter for therapeutic drug level monitoring: Secondary | ICD-10-CM | POA: Diagnosis not present

## 2020-10-25 DIAGNOSIS — Z79818 Long term (current) use of other agents affecting estrogen receptors and estrogen levels: Secondary | ICD-10-CM

## 2020-10-25 DIAGNOSIS — C61 Malignant neoplasm of prostate: Secondary | ICD-10-CM

## 2020-10-25 DIAGNOSIS — Z5111 Encounter for antineoplastic chemotherapy: Secondary | ICD-10-CM | POA: Insufficient documentation

## 2020-10-25 LAB — CBC WITH DIFFERENTIAL/PLATELET
Abs Immature Granulocytes: 0.03 10*3/uL (ref 0.00–0.07)
Basophils Absolute: 0 10*3/uL (ref 0.0–0.1)
Basophils Relative: 0 %
Eosinophils Absolute: 0.2 10*3/uL (ref 0.0–0.5)
Eosinophils Relative: 2 %
HCT: 38.9 % — ABNORMAL LOW (ref 39.0–52.0)
Hemoglobin: 13.3 g/dL (ref 13.0–17.0)
Immature Granulocytes: 0 %
Lymphocytes Relative: 18 %
Lymphs Abs: 1.7 10*3/uL (ref 0.7–4.0)
MCH: 30.5 pg (ref 26.0–34.0)
MCHC: 34.2 g/dL (ref 30.0–36.0)
MCV: 89.2 fL (ref 80.0–100.0)
Monocytes Absolute: 0.7 10*3/uL (ref 0.1–1.0)
Monocytes Relative: 8 %
Neutro Abs: 6.5 10*3/uL (ref 1.7–7.7)
Neutrophils Relative %: 72 %
Platelets: 207 10*3/uL (ref 150–400)
RBC: 4.36 MIL/uL (ref 4.22–5.81)
RDW: 13 % (ref 11.5–15.5)
WBC: 9.2 10*3/uL (ref 4.0–10.5)
nRBC: 0 % (ref 0.0–0.2)

## 2020-10-25 LAB — COMPREHENSIVE METABOLIC PANEL
ALT: 17 U/L (ref 0–44)
AST: 19 U/L (ref 15–41)
Albumin: 4.1 g/dL (ref 3.5–5.0)
Alkaline Phosphatase: 60 U/L (ref 38–126)
Anion gap: 9 (ref 5–15)
BUN: 21 mg/dL (ref 8–23)
CO2: 28 mmol/L (ref 22–32)
Calcium: 9.3 mg/dL (ref 8.9–10.3)
Chloride: 100 mmol/L (ref 98–111)
Creatinine, Ser: 0.92 mg/dL (ref 0.61–1.24)
GFR, Estimated: >60 mL/min (ref >60)
Glucose, Bld: 129 mg/dL — ABNORMAL HIGH (ref 70–99)
Potassium: 3.8 mmol/L (ref 3.5–5.1)
Sodium: 137 mmol/L (ref 135–145)
Total Bilirubin: 0.3 mg/dL (ref 0.3–1.2)
Total Protein: 7 g/dL (ref 6.5–8.1)

## 2020-10-25 LAB — PSA: Prostatic Specific Antigen: 4.68 ng/mL — ABNORMAL HIGH (ref 0.00–4.00)

## 2020-10-25 MED ORDER — LEUPROLIDE ACETATE (6 MONTH) 45 MG ~~LOC~~ KIT
45.0000 mg | PACK | Freq: Once | SUBCUTANEOUS | Status: AC
  Administered 2020-10-25: 45 mg via SUBCUTANEOUS
  Filled 2020-10-25: qty 45

## 2020-10-25 NOTE — Progress Notes (Signed)
Hematology/Oncology Consult note [facility]  [phone] [phone]  Patient Care Team: Olin Hauser, DO as PCP - General (Family Medicine) Luan Pulling Ronelle Nigh., MD (Inactive) (Unknown Physician Specialty) Grace Isaac, MD (Inactive) as Consulting Physician (Cardiothoracic Surgery) Clance, Armando Reichert, MD as Consulting Physician (Pulmonary Disease) Grace Isaac, MD (Inactive) as Consulting Physician (Cardiothoracic Surgery) Clance, Armando Reichert, MD as Consulting Physician (Pulmonary Disease) Minor, Dalbert Garnet, RN (Inactive) as Case Manager Sindy Guadeloupe, MD as Consulting Physician (Hematology and Oncology)   Name of the patient: Eric Stone  [id number]  05-16-1945   Date of visit: 10/25/20  Diagnosis- 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  He has not undergone the surgery for his prostate but has seen Dr. Bernardo Heater in the past.  He was receiving intermittent ADT and last received 45 mg of Eligard on 09/30/2018.  His PSA following that went down to 0.87 December 2020.  He did not receive any ADT after that and his most recent PSA on 09/24/2019 was elevated at 5.08.  Patient reports having significant hot flashes and fatigue when he gets ADT.    Patient presently on continuous ADT since July 2021 which she gets every 6 months.  Bone scan did not show any evidence of metastatic disease.  Interval history-patient reports tolerating ADT well except for self-limited hot flashes.  ECOG PS- 1 Pain scale- 0   Review of systems- Review of Systems  Constitutional:  Negative for chills, fever, malaise/fatigue and weight loss.  HENT:  Negative for congestion,  ear discharge and nosebleeds.   Eyes:  Negative for blurred vision.  Respiratory:  Negative for cough, hemoptysis, sputum production, shortness of breath and wheezing.   Cardiovascular:  Negative for chest pain, palpitations, orthopnea and claudication.  Gastrointestinal:  Negative for abdominal pain, blood in stool, constipation, diarrhea, heartburn, melena, nausea and vomiting.  Genitourinary:  Negative for dysuria, flank pain, frequency, hematuria and urgency.  Musculoskeletal:  Negative for back pain, joint pain and myalgias.  Skin:  Negative for rash.  Neurological:  Negative for dizziness, tingling, focal weakness, seizures, weakness and headaches.  Endo/Heme/Allergies:  Does not bruise/bleed easily.  Psychiatric/Behavioral:  Negative for depression and suicidal ideas. The patient does not have insomnia.     Allergies  Allergen Reactions   Penicillins Other (See Comments)    As a child     Past Medical History:  Diagnosis Date   Arthritis    H/O blood clots    H/O blood clots    in left leg in 1988   Hx of dysplastic nevus 06/18/2013   L lat ankle - mild   Melanoma ([place]) 02/21/2016   L prox med plantar great toe - Breslow's 1.62m, Clark level early 4   Rash    occasionally and pt does see a dermatologist   Sleep apnea    uses a CPAP;sleep study >545yrago     Past Surgical History:  Procedure Laterality Date   TOE SURGERY     melanoma   TOTAL KNEE ARTHROPLASTY  2009-2010   bilateral   VIDEO ASSISTED THORACOSCOPY (VATS)/DECORTICATION Left 05/06/2012   Procedure: VIDEO ASSISTED THORACOSCOPY (VATS) drainage loculated pleural effusion;  Surgeon: EdGrace IsaacMD;  [facility]  Service: Thoracic;  Laterality: Left;   VIDEO BRONCHOSCOPY N/A 05/06/2012   Procedure: VIDEO BRONCHOSCOPY;  Surgeon: Grace Isaac, MD;  Location: [facility] OR;  Service: Thoracic;  Laterality: N/A;    Social History   Socioeconomic History   Marital status: Married    Spouse name: Not  on file   Number of children: Not on file   Years of education: tech school   Highest education level: High school graduate  Occupational History   Occupation: retired  Tobacco Use   Smoking status: Former    Packs/day: 1.00    Years: 26.00    Pack years: 26.00    Types: Cigarettes    Quit date: 03/26/1989    Years since quitting: 31.6   Smokeless tobacco: Former   Tobacco comments:    less than 1 PPD  Vaping Use   Vaping Use: Never used  Substance and Sexual Activity   Alcohol use: No    Alcohol/week: 0.0 standard drinks   Drug use: No   Sexual activity: Yes  Other Topics Concern   Not on file  Social History Narrative   Not on file   Social Determinants of Health   Financial Resource Strain: Not on file  Food Insecurity: Not on file  Transportation Needs: Not on file  Physical Activity: Not on file  Stress: Not on file  Social Connections: Not on file  Intimate Partner Violence: Not on file    Family History  Problem Relation Age of Onset   Hypertension Mother    Stroke Mother        possible   Cancer Brother      Current Outpatient Medications:    aspirin EC 81 MG tablet, Take 81 mg by mouth., Disp: , Rfl:    hydrochlorothiazide (HYDRODIURIL) 12.5 MG tablet, Take 1 tablet (12.5 mg total) by mouth daily., Disp: 90 tablet, Rfl: 3   lisinopril (ZESTRIL) 30 MG tablet, Take 1 tablet (30 mg total) by mouth daily., Disp: 90 tablet, Rfl: 3   pimecrolimus (ELIDEL) 1 % cream, APPLY TO AFFECTED AREA GROIN TWICE DAILY AS DIRECTED, Disp: 60 g, Rfl: 5   rosuvastatin (CRESTOR) 10 MG tablet, Take 1 tablet (10 mg total) by mouth at bedtime., Disp: 90 tablet, Rfl: 3  Physical exam:  Vitals:   10/25/20 1315  BP: 135/76  Pulse: 72  Resp: 20  Temp: (!) 96 F (35.6 C)  TempSrc: Tympanic  SpO2: 96%  Weight: 239 lb 1.6 oz (108.5 kg)   Physical Exam Constitutional:      General: He is not in acute distress. Cardiovascular:     Rate and Rhythm: Normal rate and regular  rhythm.     Heart sounds: Normal heart sounds.  Pulmonary:     Effort: Pulmonary effort is normal.     Breath sounds: Normal breath sounds.  Abdominal:     General: Bowel sounds are normal.     Palpations: Abdomen is soft.  Skin:    General: Skin is warm and dry.  Neurological:     Mental Status: He is alert and oriented to person, place, and time.     CMP Latest Ref Rng & Units 10/25/2020  Glucose 70 - 99 mg/dL 129(H)  BUN 8 - 23 mg/dL 21  Creatinine 0.61 - 1.24 mg/dL 0.92  Sodium 135 - 145 mmol/L 137  Potassium 3.5 - 5.1 mmol/L 3.8  Chloride 98 - 111 mmol/L 100  CO2 22 - 32 mmol/L 28  Calcium 8.9 -  10.3 mg/dL 9.3  Total Protein 6.5 - 8.1 g/dL 7.0  Total Bilirubin 0.3 - 1.2 mg/dL 0.3  Alkaline Phos 38 - 126 U/L 60  AST 15 - 41 U/L 19  ALT 0 - 44 U/L 17   CBC Latest Ref Rng & Units 10/25/2020  WBC 4.0 - 10.5 K/uL 9.2  Hemoglobin 13.0 - 17.0 g/dL 13.3  Hematocrit 39.0 - 52.0 % 38.9(L)  Platelets 150 - 400 K/uL 207     Assessment and plan- Patient is a 75 y.o. male with castrate sensitive nonmetastatic prostate cancer on Lupron here for routine follow-up  Patient's PSA a year ago was 5.08.  Then it went down to 4.9, 1.72, 1.6, 1.53 months ago.  On 09/27/2020 his level was 1.98 which was higher than his previous value.  PSA from today is pending.  He will proceed with Lupron today.  If there is a persistent rise in his PSA despite Lupron, we will consider getting repeat imaging and consideration for additional antiandrogen therapy based on PSA doubling time.   CBC with differential CMP and PSA in 3 months in 6 months and I will see him back in 6 months for his next dose of Lupron   Visit Diagnosis 1. Encounter for monitoring Lupron therapy   2. Malignant neoplasm of prostate [facility])      Dr. Randa Evens, MD, MPH [facility] at [facility] [id number] 10/25/2020 1:11 PM

## 2020-10-26 ENCOUNTER — Other Ambulatory Visit: Payer: Self-pay

## 2020-10-26 ENCOUNTER — Other Ambulatory Visit: Payer: Self-pay | Admitting: *Deleted

## 2020-10-26 ENCOUNTER — Telehealth: Payer: Self-pay

## 2020-10-26 DIAGNOSIS — C61 Malignant neoplasm of prostate: Secondary | ICD-10-CM

## 2020-10-26 NOTE — Telephone Encounter (Signed)
Spoke with patient regarding his Psa lab value and what Dr .Janese Banks recommends, a bone scan and ct of the abdomen pt understands and state he will wait for a call for appts. [facility]

## 2020-10-27 ENCOUNTER — Encounter: Payer: Self-pay | Admitting: Oncology

## 2020-11-09 ENCOUNTER — Encounter
Admission: RE | Admit: 2020-11-09 | Discharge: 2020-11-09 | Disposition: A | Discharge status: Home / Self Care | Payer: PPO | Source: Ambulatory Visit | Attending: Oncology | Admitting: Oncology

## 2020-11-09 ENCOUNTER — Other Ambulatory Visit: Payer: Self-pay

## 2020-11-09 ENCOUNTER — Ambulatory Visit
Admission: RE | Admit: 2020-11-09 | Discharge: 2020-11-09 | Disposition: A | Discharge status: Home / Self Care | Payer: PPO | Source: Ambulatory Visit | Attending: Oncology | Admitting: Oncology

## 2020-11-09 ENCOUNTER — Ambulatory Visit: Admission: RE | Admit: 2020-11-09 | Payer: PPO | Source: Ambulatory Visit

## 2020-11-09 DIAGNOSIS — C61 Malignant neoplasm of prostate: Secondary | ICD-10-CM

## 2020-11-09 DIAGNOSIS — M19032 Primary osteoarthritis, left wrist: Secondary | ICD-10-CM | POA: Diagnosis not present

## 2020-11-09 DIAGNOSIS — M19012 Primary osteoarthritis, left shoulder: Secondary | ICD-10-CM | POA: Diagnosis not present

## 2020-11-09 MED ORDER — TECHNETIUM TC 99M MEDRONATE IV KIT
20.0000 | PACK | Freq: Once | INTRAVENOUS | Status: AC | PRN
  Administered 2020-11-09: 20.93 via INTRAVENOUS

## 2020-11-11 ENCOUNTER — Other Ambulatory Visit: Payer: Self-pay

## 2020-11-11 ENCOUNTER — Other Ambulatory Visit: Payer: Self-pay | Admitting: Oncology

## 2020-11-11 ENCOUNTER — Ambulatory Visit
Admission: RE | Admit: 2020-11-11 | Discharge: 2020-11-11 | Disposition: A | Discharge status: Home / Self Care | Payer: PPO | Source: Ambulatory Visit | Attending: Oncology | Admitting: Oncology

## 2020-11-11 DIAGNOSIS — N281 Cyst of kidney, acquired: Secondary | ICD-10-CM | POA: Diagnosis not present

## 2020-11-11 DIAGNOSIS — C61 Malignant neoplasm of prostate: Secondary | ICD-10-CM | POA: Diagnosis not present

## 2020-11-11 MED ORDER — IOHEXOL 350 MG/ML SOLN
100.0000 mL | Freq: Once | INTRAVENOUS | Status: AC | PRN
  Administered 2020-11-11: 100 mL via INTRAVENOUS

## 2020-11-14 ENCOUNTER — Other Ambulatory Visit: Payer: Self-pay

## 2020-11-14 ENCOUNTER — Other Ambulatory Visit (HOSPITAL_COMMUNITY): Payer: Self-pay

## 2020-11-14 ENCOUNTER — Inpatient Hospital Stay: Payer: PPO | Admitting: Oncology

## 2020-11-14 ENCOUNTER — Encounter: Payer: Self-pay | Admitting: Oncology

## 2020-11-14 ENCOUNTER — Telehealth: Payer: Self-pay | Admitting: Pharmacist

## 2020-11-14 ENCOUNTER — Telehealth: Payer: Self-pay | Admitting: Pharmacy Technician

## 2020-11-14 ENCOUNTER — Inpatient Hospital Stay: Payer: PPO | Admitting: Pharmacist

## 2020-11-14 VITALS — BP 124/75 | HR 65 | Temp 97.5°F | Resp 18 | Wt 242.0 lb

## 2020-11-14 DIAGNOSIS — Z79899 Other long term (current) drug therapy: Secondary | ICD-10-CM | POA: Diagnosis not present

## 2020-11-14 DIAGNOSIS — C61 Malignant neoplasm of prostate: Secondary | ICD-10-CM

## 2020-11-14 DIAGNOSIS — Z7189 Other specified counseling: Secondary | ICD-10-CM | POA: Diagnosis not present

## 2020-11-14 MED ORDER — APALUTAMIDE 60 MG PO TABS: 240.0000 mg | ORAL_TABLET | Freq: Every day | ORAL | 5 refills | Status: DC

## 2020-11-14 MED ORDER — APALUTAMIDE 60 MG PO TABS
240.0000 mg | ORAL_TABLET | Freq: Every day | ORAL | 5 refills | Status: DC
  Filled 2020-11-14 – 2020-11-17 (×2): qty 120, 30d supply, fill #0

## 2020-11-14 NOTE — Progress Notes (Signed)
[facility]  [phone] [phone]) [phone]  Patient Care Team: Olin Hauser, DO as PCP - General (Family Medicine) Luan Pulling Ronelle Nigh., MD (Inactive) (Unknown Physician Specialty) Grace Isaac, MD (Inactive) as Consulting Physician (Cardiothoracic Surgery) Clance, Armando Reichert, MD as Consulting Physician (Pulmonary Disease) Grace Isaac, MD (Inactive) as Consulting Physician (Cardiothoracic Surgery) Clance, Armando Reichert, MD as Consulting Physician (Pulmonary Disease) Minor, Dalbert Garnet, RN (Inactive) as Case Manager Sindy Guadeloupe, MD as Consulting Physician (Hematology and Oncology)   Name of the patient: Eric Stone  [id number]  [birth year]   Date of visit: 11/14/20  HPI: Patient is a 75 y.o. male with non-metastatic castration resistant prostate cancer. Previously on treatment with ADT (leuprolide).The plan is to add on treatment with Erleada (apalutamide).   Reason for Consult: Erleada (apalutamide) oral chemotherapy education.   PAST MEDICAL HISTORY: Past Medical History:  Diagnosis Date   Arthritis    H/O blood clots    H/O blood clots    in left leg in 1988   Hx of dysplastic nevus 06/18/2013   L lat ankle - mild   Melanoma ([place]) 02/21/2016   L prox med plantar great toe - Breslow's 1.30m, Clark level early 4   Rash    occasionally and pt does see a dermatologist   Sleep apnea    uses a CPAP;sleep study >58yrago    HEMATOLOGY/ONCOLOGY HISTORY:  Oncology History   No history exists.    ALLERGIES:  is allergic to penicillins.  MEDICATIONS:  Current Outpatient Medications  Medication Sig Dispense Refill   apalutamide (ERLEADA) 60 MG tablet Take 4 tablets (240 mg total) by mouth daily. 120 tablet 5   aspirin EC 81 MG tablet Take 81 mg by mouth.     hydrochlorothiazide (HYDRODIURIL) 12.5 MG tablet Take 1 tablet (12.5 mg total) by mouth daily. 90 tablet 3   lisinopril (ZESTRIL) 30 MG  tablet Take 1 tablet (30 mg total) by mouth daily. 90 tablet 3   pimecrolimus (ELIDEL) 1 % cream APPLY TO AFFECTED AREA GROIN TWICE DAILY AS DIRECTED 60 g 5   rosuvastatin (CRESTOR) 10 MG tablet Take 1 tablet (10 mg total) by mouth at bedtime. 90 tablet 3   No current facility-administered medications for this visit.    VITAL SIGNS: There were no vitals taken for this visit. There were no vitals filed for this visit.  Estimated body mass index is 34.72 kg/m as calculated from the following:   Height as of 07/12/20: '5\' 10"'$  (1.778 m).   Weight as of an earlier encounter on 11/14/20: 109.8 kg (242 lb).  LABS: CBC:    Component Value Date/Time   WBC 9.2 10/25/2020 1255   HGB 13.3 10/25/2020 1255   HGB 15.0 07/12/2015 0910   HCT 38.9 (L) 10/25/2020 1255   HCT 43.3 07/12/2015 0910   PLT 207 10/25/2020 1255   PLT 204 07/12/2015 0910   MCV 89.2 10/25/2020 1255   MCV 88 07/12/2015 0910   MCV 91 01/14/2014 1357   NEUTROABS 6.5 10/25/2020 1255   NEUTROABS 3.7 07/12/2015 0910   NEUTROABS 4.3 01/14/2014 1357   LYMPHSABS 1.7 10/25/2020 1255   LYMPHSABS 1.2 07/12/2015 0910   LYMPHSABS 0.7 (L) 01/14/2014 1357   MONOABS 0.7 10/25/2020 1255   MONOABS 0.5 01/14/2014 1357   EOSABS 0.2 10/25/2020 1255   EOSABS 0.2 07/12/2015 0910   EOSABS 0.2 01/14/2014 1357   BASOSABS 0.0 10/25/2020 1255   BASOSABS  0.0 07/12/2015 0910   BASOSABS 0.0 01/14/2014 1357   Comprehensive Metabolic Panel:    Component Value Date/Time   NA 137 10/25/2020 1255   NA 143 07/12/2015 0910   NA 137 04/17/2012 0543   K 3.8 10/25/2020 1255   K 3.6 04/17/2012 0543   CL 100 10/25/2020 1255   CL 101 04/17/2012 0543   CO2 28 10/25/2020 1255   CO2 26 04/17/2012 0543   BUN 21 10/25/2020 1255   BUN 16 07/12/2015 0910   BUN 25 (H) 04/17/2012 0543   CREATININE 0.92 10/25/2020 1255   CREATININE 1.02 01/05/2020 0748   GLUCOSE 129 (H) 10/25/2020 1255   GLUCOSE 148 (H) 04/17/2012 0543   CALCIUM 9.3 10/25/2020 1255    CALCIUM 8.6 04/17/2012 0543   AST 19 10/25/2020 1255   AST 21 04/17/2012 0543   ALT 17 10/25/2020 1255   ALT 24 04/17/2012 0543   ALKPHOS 60 10/25/2020 1255   ALKPHOS 84 04/17/2012 0543   BILITOT 0.3 10/25/2020 1255   BILITOT 0.6 07/12/2015 0910   BILITOT 0.7 04/17/2012 0543   PROT 7.0 10/25/2020 1255   PROT 6.9 07/12/2015 0910   PROT 7.6 04/17/2012 0543   ALBUMIN 4.1 10/25/2020 1255   ALBUMIN 4.4 07/12/2015 0910   ALBUMIN 2.9 (L) 04/17/2012 0543     Present during today's visit: patient only  Assessment and Plan: Start plan: Pending medication access.    Patient Education I spoke with patient for overview of new oral chemotherapy medication: apalutamide  Administration: Counseled patient on administration, dosing, side effects, monitoring, drug-food interactions, safe handling, storage, and disposal. Patient will take 4 tablets (240 mg total) by mouth daily.  Side Effects: Side effects include but not limited to: fatigue and decrease wbc/hgb.    Drug-drug Interactions (DDI): Rosuvastatin: interaction reviewed with the patient, he has a f/u scheduled with his PCP in October and which time his cholesterol will be rechecked. He know that the apalutamide may decreased the effectiveness of his rosuvastatin and his rosuvastatin dose may need to be increased at that time.  Adherence: After discussion with patient no patient barriers to medication adherence identified.  Reviewed with patient importance of keeping a medication schedule and plan for any missed doses.  Mr. Leavins voiced understanding and appreciation. All questions answered. Medication handout provided.  Provided patient with [facility] phone number. Patient knows to call the office with questions or concerns. Oral [facility] will continue to follow.  Patient expressed understanding and was in agreement with this plan. He also understands that He can call clinic at any  time with any questions, concerns, or complaints.   Medication Access Issues: We will be able to get Mr. Mackowski started on apalutamide using a 30-day free trial voucher. Due to a high copay he will need to be enrolled in manufacturer assistance. He will call Bethena Roys, patient advocate with his income information.  Follow-up plan: pharmacy clinic f/u pending start date  Thank you for allowing me to participate in the care of this patient.   Time Total: 20 mins  Visit consisted of counseling and education on dealing with issues of symptom management in the setting of serious and potentially life-threatening illness.Greater than 50%  of this time was spent counseling and coordinating care related to the above assessment and plan.  Signed by: Darl Pikes, PharmD, BCPS, Salley Slaughter, CPP Hematology/Oncology Clinical Pharmacist Practitioner [facility]/HP/AP [facility] [phone]  11/14/2020 4:52 PM

## 2020-11-14 NOTE — Progress Notes (Signed)
I connected with Eric Stone on 11/14/20 at  2:00 PM EDT by video enabled telemedicine visit and verified that I am speaking with the correct person using two identifiers.   I discussed the limitations, risks, security and privacy concerns of performing an evaluation and management service by telemedicine and the availability of in-person appointments. I also discussed with the patient that there may be a patient responsible charge related to this service. The patient expressed understanding and agreed to proceed.  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  He has not undergone the surgery for his prostate but has seen Dr. Bernardo Heater in the past.  He was receiving intermittent ADT and last received 45 mg of Eligard on 09/30/2018.  His PSA following that went down to 0.87 December 2020.  He did not receive any ADT after that and his most recent PSA on 09/24/2019 was elevated at 5.08.  Patient reports having significant hot flashes and fatigue when he gets ADT.    Patient presently on continuous ADT since July 2021 which she gets every 6 months.  Bone scan did not show any evidence of metastatic disease.  Interval history Patient reports doing well. Has self limited hot flashes. Denies other complaints   Review of Systems  Constitutional:  Negative for chills, fever, malaise/fatigue and weight loss.  HENT:  Negative for congestion, ear discharge and nosebleeds.   Eyes:  Negative for blurred vision.  Respiratory:  Negative for cough, hemoptysis, sputum production, shortness of breath and wheezing.   Cardiovascular:  Negative for chest pain, palpitations, orthopnea and claudication.   Gastrointestinal:  Negative for abdominal pain, blood in stool, constipation, diarrhea, heartburn, melena, nausea and vomiting.  Genitourinary:  Negative for dysuria, flank pain, frequency, hematuria and urgency.  Musculoskeletal:  Negative for back pain, joint pain and myalgias.  Skin:  Negative for rash.  Neurological:  Negative for dizziness, tingling, focal weakness, seizures, weakness and headaches.  Endo/Heme/Allergies:  Does not bruise/bleed easily.       Hot flashes  Psychiatric/Behavioral:  Negative for depression and suicidal ideas. The patient does not have insomnia.    Allergies  Allergen Reactions   Penicillins Other (See Comments)    As a child    Past Medical History:  Diagnosis Date   Arthritis    H/O blood clots    H/O blood clots    in left leg in 1988   Hx of dysplastic nevus 06/18/2013   L lat ankle - mild   Melanoma ([place]) 02/21/2016   L prox med plantar great toe - Breslow's 1.27m, Clark level early 4   Rash    occasionally and pt does see a dermatologist   Sleep apnea    uses a CPAP;sleep study >530yrago    Past Surgical History:  Procedure Laterality Date   TOE SURGERY     melanoma   TOTAL KNEE ARTHROPLASTY  2009-2010   bilateral   VIDEO ASSISTED THORACOSCOPY (VATS)/DECORTICATION Left 05/06/2012   Procedure: VIDEO ASSISTED THORACOSCOPY (VATS) drainage loculated pleural effusion;  Surgeon: EdGrace IsaacMD;  [facility] Service: Thoracic;  Laterality: Left;   VIDEO BRONCHOSCOPY N/A 05/06/2012   Procedure: VIDEO BRONCHOSCOPY;  Surgeon: Grace Isaac, MD;  Location: [facility] OR;  Service: Thoracic;  Laterality: N/A;    Social History   Socioeconomic History   Marital status: Married    Spouse name: Not on file   Number of children: Not on file   Years of education: tech school   Highest education level: High school graduate  Occupational History   Occupation: retired  Tobacco Use   Smoking status: Former    Packs/day: 1.00     Years: 26.00    Pack years: 26.00    Types: Cigarettes    Quit date: 03/26/1989    Years since quitting: 31.6   Smokeless tobacco: Former   Tobacco comments:    less than 1 PPD  Vaping Use   Vaping Use: Never used  Substance and Sexual Activity   Alcohol use: No    Alcohol/week: 0.0 standard drinks   Drug use: No   Sexual activity: Yes  Other Topics Concern   Not on file  Social History Narrative   Not on file   Social Determinants of Health   Financial Resource Strain: Not on file  Food Insecurity: Not on file  Transportation Needs: Not on file  Physical Activity: Not on file  Stress: Not on file  Social Connections: Not on file  Intimate Partner Violence: Not on file    Family History  Problem Relation Age of Onset   Hypertension Mother    Stroke Mother        possible   Cancer Brother      Current Outpatient Medications:    aspirin EC 81 MG tablet, Take 81 mg by mouth., Disp: , Rfl:    hydrochlorothiazide (HYDRODIURIL) 12.5 MG tablet, Take 1 tablet (12.5 mg total) by mouth daily., Disp: 90 tablet, Rfl: 3   lisinopril (ZESTRIL) 30 MG tablet, Take 1 tablet (30 mg total) by mouth daily., Disp: 90 tablet, Rfl: 3   pimecrolimus (ELIDEL) 1 % cream, APPLY TO AFFECTED AREA GROIN TWICE DAILY AS DIRECTED, Disp: 60 g, Rfl: 5   rosuvastatin (CRESTOR) 10 MG tablet, Take 1 tablet (10 mg total) by mouth at bedtime., Disp: 90 tablet, Rfl: 3   apalutamide (ERLEADA) 60 MG tablet, Take 4 tablets (240 mg total) by mouth daily., Disp: 120 tablet, Rfl: 5  NM Bone Scan Whole Body  Result Date: 11/11/2020 CLINICAL DATA:  Prostate cancer surveillance EXAM: NUCLEAR MEDICINE WHOLE BODY BONE SCAN TECHNIQUE: Whole body anterior and posterior images were obtained approximately 3 hours after intravenous injection of radiopharmaceutical. RADIOPHARMACEUTICALS:  20.93 mCi Technetium-47mMDP IV COMPARISON:  10/19/2019 FINDINGS: No areas of suspicious abnormal bony uptake to suggest osseous metastatic  disease. Photopenic areas within the knees compatible with prior hip replacements. Degenerative uptake in the region of the [facility] Hospital[facility] and left wrist. IMPRESSION: No evidence of osseous metastatic disease. Electronically Signed   By: KRolm BaptiseM.D.   On: 11/11/2020 09:27   CT ABDOMEN PELVIS W CONTRAST  Result Date: 11/13/2020 CLINICAL DATA:  Follow-up prostate carcinoma. Rising PSA. Previous radiation therapy and androgen deprivation therapy. EXAM: CT ABDOMEN AND PELVIS WITH CONTRAST TECHNIQUE: Multidetector CT imaging of the abdomen and pelvis was performed using the standard protocol following bolus administration of intravenous contrast. CONTRAST:  1031mOMNIPAQUE IOHEXOL 350 MG/ML SOLN COMPARISON:  04/17/2006 FINDINGS: Lower Chest: No acute findings. Hepatobiliary: No hepatic masses identified. Gallbladder is unremarkable. No evidence of biliary ductal dilatation. Pancreas:  No mass or inflammatory changes. Spleen: Within normal limits in size and appearance.  Adrenals/Urinary Tract: No masses identified. A few small renal cysts are noted. No evidence of ureteral calculi or hydronephrosis. Stomach/Bowel: No evidence of obstruction, inflammatory process or abnormal fluid collections. Normal appendix visualized. Vascular/Lymphatic: No pathologically enlarged lymph nodes. No acute vascular findings. Reproductive: Small prostate gland noted. Symmetric seminal vesicles. Other:  None. Musculoskeletal:  No suspicious bone lesions identified. IMPRESSION: No acute findings. No evidence of recurrent or metastatic carcinoma. Electronically Signed   By: Marlaine Hind M.D.   On: 11/13/2020 14:40    No images are attached to the encounter.   CMP Latest Ref Rng & Units 10/25/2020  Glucose 70 - 99 mg/dL 129(H)  BUN 8 - 23 mg/dL 21  Creatinine 0.61 - 1.24 mg/dL 0.92  Sodium 135 - 145 mmol/L 137  Potassium 3.5 - 5.1 mmol/L 3.8  Chloride 98 - 111 mmol/L 100  CO2 22 - 32 mmol/L 28  Calcium 8.9 - 10.3 mg/dL 9.3   Total Protein 6.5 - 8.1 g/dL 7.0  Total Bilirubin 0.3 - 1.2 mg/dL 0.3  Alkaline Phos 38 - 126 U/L 60  AST 15 - 41 U/L 19  ALT 0 - 44 U/L 17   CBC Latest Ref Rng & Units 10/25/2020  WBC 4.0 - 10.5 K/uL 9.2  Hemoglobin 13.0 - 17.0 g/dL 13.3  Hematocrit 39.0 - 52.0 % 38.9(L)  Platelets 150 - 400 K/uL 207     Observation/objective: He is in no acute distress over video visit today.  Breathing is nonlabored  Assessment and plan: Patient is a 75 year old male with history of castrate resistant nonmetastatic prostate cancer here to discuss further management  Patient has restarted Lupron therapy since July 2021.  His PSA did go down up untilApril 2022.  His PSA went down to 1.5 at that point and since then he has had 2 values which were 1.98 in July 2022 and now 4.68 in August 2022 indicating a PSA doubling time of 2.6 months.  We did get a CT abdomen and pelvis with contrast as well as bone scan which did not show any evidence of radiological progression.  Patient therefore has biochemical recurrence from a castrate resistant setting despite the use of Lupron.  Given the rapid PSA doubling time of less than 10 months it would be reasonable to consider additional antiandrogen therapy for him.  Discussed that apalutamide enzalutamide as well as darolutamide all remain potential options for him.  I will proceed with apalutamide 240 mg daily for him and work on Government social research officer for the same.  Discussed risks and benefits of apalutamide/ Erleada including all but not limited to hypertension, fatigue, leg swelling, anemia, worsening bone health and hot flashes.  Treatment will be given with a palliative intent.  Patient understands and agrees to proceed as planned.  I will tentatively plan to see him in about 6 weeks with repeat CBC with differential CMP TSH and PSA.  Results of phase 3 Spartan trial in patients with nonmetastatic CRPC assigned to apalutamide versus placebo reduced to 1 ratio while  continuing ADT showed improvement in metastasis free survival of 40.5 months versus 16.2 months.  Subsequently overall survival as well as progression Ts[place]i survival was better than the apalutamide versus placebo.  Follow-up instructions:as above  I discussed the assessment and treatment plan with the patient. The patient was provided an opportunity to ask questions and all were answered. The patient agreed with the plan and demonstrated an understanding of the instructions.   The patient was advised to call  back or seek an in-person evaluation if the symptoms worsen or if the condition fails to improve as anticipated.  Visit Diagnosis: 1. Malignant neoplasm of prostate ([place])   2. High risk medication use   3. Goals of care, counseling/discussion     Dr. Randa Evens, MD, MPH [facility] at [facility] Tel- [phone] 11/14/2020 7:01 PM

## 2020-11-14 NOTE — Telephone Encounter (Signed)
Oral Oncology Patient Advocate Encounter  Prior Authorization for Eric Stone has been approved.    PA# [id number] Effective dates: 11/14/20 through 11/14/21  Patients co-pay is $2874.81.  Oral Oncology Clinic will continue to follow.   [place] Patient [place] Phone [phone] Fax [phone] 11/14/2020 4:23 PM

## 2020-11-14 NOTE — Telephone Encounter (Signed)
Oral Oncology Student Pharmacist Encounter  Received new prescription for Erleada (apalutamide) for the treatment of castration-resistant nonmetastatic prostate cancer in conjunction with Lupron (leuprolide), planned duration until disease progression or unacceptable toxicity.  Labs from 10/25/20 assessed, no relevant lab abnormalities. PSA (4.68) trending up since 07/07/20; PSA from today pending. Weight (242 lb) increased by 3 lb since 10/25/20. BP 124/75. Testosterone (18) from 04/08/20 was at goal (<50). Prescription dose and frequency assessed and are appropriate.   Current medication list in Epic reviewed, 1 DDI with rosuvastatin identified: Apalutamide may decrease the serum concentration of rosuvastatin. Annual lipid panel from 01/05/20 unremarkable. Recheck lipid levels at follow-up visit to monitor for decreased rosuvastatin efficacy and increase rosuvastatin dose as needed.  Evaluated chart and no patient barriers to medication adherence identified.   Prescription has been e-scribed to the [facility] for benefits analysis and approval.  Oral Oncology Clinic will continue to follow for insurance authorization, copayment issues, initial counseling and start date.  Wynelle Cleveland, PharmD Candidate [facility]/HP/AP [facility] [phone]  11/14/2020 2:57 PM

## 2020-11-14 NOTE — Telephone Encounter (Signed)
Oral Oncology Patient Advocate Encounter   Received notification from [employer] that prior authorization for Eric Stone is required.   PA submitted on CoverMyMeds Key [id number] Status is pending   Oral Oncology Clinic will continue to follow.  [place] Patient [place] Phone [phone] Fax [phone] 11/14/2020 4:16 PM

## 2020-11-15 ENCOUNTER — Other Ambulatory Visit (HOSPITAL_COMMUNITY): Payer: Self-pay

## 2020-11-15 ENCOUNTER — Telehealth: Payer: Self-pay | Admitting: Oncology

## 2020-11-15 NOTE — Telephone Encounter (Signed)
Patient left a VM requested a call back from [place] in regards to a "PSA test".

## 2020-11-16 ENCOUNTER — Encounter: Payer: Self-pay | Admitting: Oncology

## 2020-11-16 ENCOUNTER — Other Ambulatory Visit (HOSPITAL_COMMUNITY): Payer: Self-pay

## 2020-11-16 NOTE — Telephone Encounter (Signed)
Called pt and left message to the pt. Dr. Janese Banks says that it is not going to help to check PSA for now. Once pt is getting on the Erleada then it takes 1 month to make a difference in the PSA level and she suggests that we just wait til October and check level then. You can call back and ask many anything else or you need to give me more info.

## 2020-11-17 ENCOUNTER — Other Ambulatory Visit (HOSPITAL_COMMUNITY): Payer: Self-pay

## 2020-11-17 ENCOUNTER — Other Ambulatory Visit: Payer: Self-pay

## 2020-11-17 DIAGNOSIS — I10 Essential (primary) hypertension: Secondary | ICD-10-CM

## 2020-11-17 MED ORDER — LISINOPRIL 30 MG PO TABS
30.0000 mg | ORAL_TABLET | Freq: Every day | ORAL | 3 refills | Status: DC
Start: 2020-11-17 — End: 2021-11-17

## 2020-11-17 MED ORDER — HYDROCHLOROTHIAZIDE 12.5 MG PO TABS
12.5000 mg | ORAL_TABLET | Freq: Every day | ORAL | 3 refills | Status: DC
Start: 2020-11-17 — End: 2021-11-17

## 2020-11-17 NOTE — Telephone Encounter (Addendum)
Oral Oncology Patient Advocate Encounter  I spoke with Mr Ruter this morning to set up delivery of a free trial of Erleada.  Address verified for shipment.  [facility] [place] will be filled through [facility] and mailed 11/17/20 for delivery 11/18/20.    [place] Patient [place] Phone [phone] Fax [phone] 11/17/2020 11:29 AM

## 2020-11-22 ENCOUNTER — Telehealth: Payer: Self-pay | Admitting: Pharmacy Technician

## 2020-11-22 NOTE — Telephone Encounter (Signed)
Oral Oncology Patient Advocate Encounter  Had patient complete application for [place] ([employer]PA[employer]) at appt on 11/14/20 in an effort to reduce patient's out of pocket expense for Erleada to $0.    Application completed and faxed to [phone].   [employer]PA[employer] patient assistance phone number for follow up is [phone].   This encounter will be updated until final determination.   [place] Patient [place] Phone [phone] Fax [phone] 11/23/2020 10:29 AM

## 2020-11-25 ENCOUNTER — Telehealth: Payer: Self-pay | Admitting: Family Medicine

## 2020-11-25 NOTE — Telephone Encounter (Signed)
Left message for patient to call back and schedule the Medicare Annual Wellness Visit (AWV) virtually or by telephone.  Last AWV 01/13/19  Please schedule at anytime with [place].  40 minute appointment  Any questions, please call me at [phone]

## 2020-11-29 NOTE — Telephone Encounter (Signed)
Oral Oncology Patient Advocate Encounter  Received notification from [place] (JJPAF) that patient has been temporarily enrolled into their program to receive Erleada from the manufacturer at $0 out of pocket until 01/28/21.  Patient will need to sign Medicare D Attestation form to have complete enrollment until 03/25/21.    I called and spoke with patient.  He knows we will have to re-apply.   Specialty Pharmacy that will dispense medication is [facility].  Patient knows to call the office with questions or concerns.   Oral Oncology Clinic will continue to follow.  [place] Patient [place] Phone [phone] Fax [phone] 11/29/2020 4:02 PM

## 2020-12-01 ENCOUNTER — Other Ambulatory Visit: Payer: Self-pay

## 2020-12-01 DIAGNOSIS — Z125 Encounter for screening for malignant neoplasm of prostate: Secondary | ICD-10-CM

## 2020-12-12 ENCOUNTER — Other Ambulatory Visit: Payer: Self-pay | Admitting: *Deleted

## 2020-12-12 DIAGNOSIS — C61 Malignant neoplasm of prostate: Secondary | ICD-10-CM

## 2020-12-12 NOTE — Progress Notes (Signed)
[place]  Telephone:(336) [phone] [phone]) [phone]  Patient Care Team: Olin Hauser, DO as PCP - General (Family Medicine) Luan Pulling Ronelle Nigh., MD (Inactive) (Unknown Physician Specialty) Grace Isaac, MD (Inactive) as Consulting Physician (Cardiothoracic Surgery) Clance, Armando Reichert, MD as Consulting Physician (Pulmonary Disease) Grace Isaac, MD (Inactive) as Consulting Physician (Cardiothoracic Surgery) Clance, Armando Reichert, MD as Consulting Physician (Pulmonary Disease) Minor, Dalbert Garnet, RN (Inactive) as Case Manager Sindy Guadeloupe, MD as Consulting Physician (Hematology and Oncology)   Name of the patient: Eric Stone  [id number]  1945-06-26   Date of visit: 12/13/20  HPI: Patient is a 75 y.o. male with non-metastatic castration resistant prostate cancer. Currently treated with Erleada (apalutamide) started 11/18/20 and ADT.   Reason for Consult: Oral chemotherapy follow-up for apalutamide therapy.   PAST MEDICAL HISTORY: Past Medical History:  Diagnosis Date   Arthritis    H/O blood clots    H/O blood clots    in left leg in 1988   Hx of dysplastic nevus 06/18/2013   L lat ankle - mild   Melanoma ([place]) 02/21/2016   L prox med plantar great toe - Breslow's 1.23mm, Clark level early 4   Rash    occasionally and pt does see a dermatologist   Sleep apnea    uses a CPAP;sleep study >34yrs ago    HEMATOLOGY/ONCOLOGY HISTORY:  Oncology History   No history exists.    ALLERGIES:  is allergic to penicillins.  MEDICATIONS:  Current Outpatient Medications  Medication Sig Dispense Refill   apalutamide (ERLEADA) 60 MG tablet Take 4 tablets (240 mg total) by mouth daily. 120 tablet 5   aspirin EC 81 MG tablet Take 81 mg by mouth.     hydrochlorothiazide (HYDRODIURIL) 12.5 MG tablet Take 1 tablet (12.5 mg total) by mouth daily. 90 tablet 3   lisinopril (ZESTRIL) 30 MG tablet Take 1 tablet (30 mg total) by  mouth daily. 90 tablet 3   pimecrolimus (ELIDEL) 1 % cream APPLY TO AFFECTED AREA GROIN TWICE DAILY AS DIRECTED 60 g 5   rosuvastatin (CRESTOR) 10 MG tablet Take 1 tablet (10 mg total) by mouth at bedtime. 90 tablet 3   No current facility-administered medications for this visit.    VITAL SIGNS: There were no vitals taken for this visit. There were no vitals filed for this visit.  Estimated body mass index is 34.72 kg/m as calculated from the following:   Height as of 07/12/20: 5\' 10"  (1.778 m).   Weight as of 11/14/20: 109.8 kg (242 lb).  LABS: CBC:    Component Value Date/Time   WBC 6.2 12/13/2020 0904   HGB 13.4 12/13/2020 0904   HGB 15.0 07/12/2015 0910   HCT 39.7 12/13/2020 0904   HCT 43.3 07/12/2015 0910   PLT 181 12/13/2020 0904   PLT 204 07/12/2015 0910   MCV 88.8 12/13/2020 0904   MCV 88 07/12/2015 0910   MCV 91 01/14/2014 1357   NEUTROABS 4.1 12/13/2020 0904   NEUTROABS 3.7 07/12/2015 0910   NEUTROABS 4.3 01/14/2014 1357   LYMPHSABS 1.2 12/13/2020 0904   LYMPHSABS 1.2 07/12/2015 0910   LYMPHSABS 0.7 (L) 01/14/2014 1357   MONOABS 0.5 12/13/2020 0904   MONOABS 0.5 01/14/2014 1357   EOSABS 0.3 12/13/2020 0904   EOSABS 0.2 07/12/2015 0910   EOSABS 0.2 01/14/2014 1357   BASOSABS 0.1 12/13/2020 0904   BASOSABS 0.0 07/12/2015 0910   BASOSABS 0.0 01/14/2014 1357  Comprehensive Metabolic Panel:    Component Value Date/Time   NA 139 12/13/2020 0904   NA 143 07/12/2015 0910   NA 137 04/17/2012 0543   K 3.6 12/13/2020 0904   K 3.6 04/17/2012 0543   CL 104 12/13/2020 0904   CL 101 04/17/2012 0543   CO2 26 12/13/2020 0904   CO2 26 04/17/2012 0543   BUN 23 12/13/2020 0904   BUN 16 07/12/2015 0910   BUN 25 (H) 04/17/2012 0543   CREATININE 1.16 12/13/2020 0904   CREATININE 1.02 01/05/2020 0748   GLUCOSE 155 (H) 12/13/2020 0904   GLUCOSE 148 (H) 04/17/2012 0543   CALCIUM 9.1 12/13/2020 0904   CALCIUM 8.6 04/17/2012 0543   AST 22 12/13/2020 0904   AST 21  04/17/2012 0543   ALT 18 12/13/2020 0904   ALT 24 04/17/2012 0543   ALKPHOS 58 12/13/2020 0904   ALKPHOS 84 04/17/2012 0543   BILITOT 0.7 12/13/2020 0904   BILITOT 0.6 07/12/2015 0910   BILITOT 0.7 04/17/2012 0543   PROT 7.1 12/13/2020 0904   PROT 6.9 07/12/2015 0910   PROT 7.6 04/17/2012 0543   ALBUMIN 4.0 12/13/2020 0904   ALBUMIN 4.4 07/12/2015 0910   ALBUMIN 2.9 (L) 04/17/2012 0543     Present during today's visit: patient only  Assessment and Plan: Reviewed CBC and CMP with patient, continue apalutamide 240mg  daily   Oral Chemotherapy Side Effect/Intolerance:  Change in taste: Mr. Gaxiola reported a change in taste, particularly with liquids, he has not noticed a change in appetite related to this Fatigue: no different than prior to starting apalutamide No reported HA/dizziness, edema (no worse than his normal), rash, diarrhea, or nausea He is not checking his BP regularly at home, suggested that he check his BP a few times a week prior to his next appt   Other: Mr. Sayegh had a few concerns about the apalutamide that were discussed during the visit He was concern about the risk to others with him being on apalutamide, he specifically mentioned concern about sharing cups with others. Discussed with him that as long as no one is taking the medication other than him and that others are not touching the medication directly, those are the main precautions. He was worried about this risk of increase in cholesterol and  A1c, those are not common things with apalutamide and we would recommend he continue to see his PCP annually Discussed the benefit to adding on apalutamide to ADT therapy in patients with non-metastatic castration resistant prostate cancer  Oral Chemotherapy Adherence: no missed doses No patient barriers to medication adherence identified.   New medications: none reported  Medication Access Issues: no issues, patient enrolled in JJPAF manufacturer assistance and  he has received a shipment from them, during today's visit he signed the Medicare attestation required by JJPAF. Judy faxed this to [employer] to confirm his assistance until the end of the year  Patient expressed understanding and was in agreement with this plan. He also understands that He can call clinic at any time with any questions, concerns, or complaints.   Follow-up plan: RCT on 01/03/21  Thank you for allowing me to participate in the care of this very pleasant patient.   Time Total: 30 mins  Visit consisted of counseling and education on dealing with issues of symptom management in the setting of serious and potentially life-threatening illness.Greater than 50%  of this time was spent counseling and coordinating care related to the above assessment and plan.  Signed  by: Darl Pikes, PharmD, BCPS, Salley Slaughter, CPP Hematology/Oncology Clinical Pharmacist Practitioner [facility]/HP/AP [facility] [phone]  12/13/2020 10:45 AM

## 2020-12-13 ENCOUNTER — Inpatient Hospital Stay: Payer: PPO | Attending: Oncology

## 2020-12-13 ENCOUNTER — Inpatient Hospital Stay: Payer: PPO | Admitting: Pharmacist

## 2020-12-13 DIAGNOSIS — Z79899 Other long term (current) drug therapy: Secondary | ICD-10-CM | POA: Diagnosis not present

## 2020-12-13 DIAGNOSIS — C61 Malignant neoplasm of prostate: Secondary | ICD-10-CM

## 2020-12-13 LAB — COMPREHENSIVE METABOLIC PANEL
ALT: 18 U/L (ref 0–44)
AST: 22 U/L (ref 15–41)
Albumin: 4 g/dL (ref 3.5–5.0)
Alkaline Phosphatase: 58 U/L (ref 38–126)
Anion gap: 9 (ref 5–15)
BUN: 23 mg/dL (ref 8–23)
CO2: 26 mmol/L (ref 22–32)
Calcium: 9.1 mg/dL (ref 8.9–10.3)
Chloride: 104 mmol/L (ref 98–111)
Creatinine, Ser: 1.16 mg/dL (ref 0.61–1.24)
GFR, Estimated: >60 mL/min (ref >60)
Glucose, Bld: 155 mg/dL — ABNORMAL HIGH (ref 70–99)
Potassium: 3.6 mmol/L (ref 3.5–5.1)
Sodium: 139 mmol/L (ref 135–145)
Total Bilirubin: 0.7 mg/dL (ref 0.3–1.2)
Total Protein: 7.1 g/dL (ref 6.5–8.1)

## 2020-12-13 LAB — CBC WITH DIFFERENTIAL/PLATELET
Abs Immature Granulocytes: 0.02 10*3/uL (ref 0.00–0.07)
Basophils Absolute: 0.1 10*3/uL (ref 0.0–0.1)
Basophils Relative: 1 %
Eosinophils Absolute: 0.3 10*3/uL (ref 0.0–0.5)
Eosinophils Relative: 6 %
HCT: 39.7 % (ref 39.0–52.0)
Hemoglobin: 13.4 g/dL (ref 13.0–17.0)
Immature Granulocytes: 0 %
Lymphocytes Relative: 19 %
Lymphs Abs: 1.2 10*3/uL (ref 0.7–4.0)
MCH: 30 pg (ref 26.0–34.0)
MCHC: 33.8 g/dL (ref 30.0–36.0)
MCV: 88.8 fL (ref 80.0–100.0)
Monocytes Absolute: 0.5 10*3/uL (ref 0.1–1.0)
Monocytes Relative: 7 %
Neutro Abs: 4.1 10*3/uL (ref 1.7–7.7)
Neutrophils Relative %: 67 %
Platelets: 181 10*3/uL (ref 150–400)
RBC: 4.47 MIL/uL (ref 4.22–5.81)
RDW: 12.8 % (ref 11.5–15.5)
WBC: 6.2 10*3/uL (ref 4.0–10.5)
nRBC: 0 % (ref 0.0–0.2)

## 2020-12-13 LAB — PSA: Prostatic Specific Antigen: 0.06 ng/mL (ref 0.00–4.00)

## 2020-12-13 LAB — TSH: TSH: 1.293 u[IU]/mL (ref 0.350–4.500)

## 2020-12-13 NOTE — Telephone Encounter (Signed)
Faxed signed Med D attestation to JJPAF [phone])  [place] Patient [place] Phone [phone] Fax [phone] 12/13/2020 10:40 AM

## 2020-12-29 ENCOUNTER — Other Ambulatory Visit: Payer: Self-pay | Admitting: *Deleted

## 2020-12-29 DIAGNOSIS — C61 Malignant neoplasm of prostate: Secondary | ICD-10-CM

## 2021-01-03 ENCOUNTER — Other Ambulatory Visit: Payer: Self-pay

## 2021-01-03 ENCOUNTER — Inpatient Hospital Stay: Payer: PPO | Admitting: Oncology

## 2021-01-03 ENCOUNTER — Encounter: Payer: Self-pay | Admitting: Oncology

## 2021-01-03 ENCOUNTER — Inpatient Hospital Stay: Payer: PPO | Attending: Oncology

## 2021-01-03 DIAGNOSIS — C61 Malignant neoplasm of prostate: Secondary | ICD-10-CM | POA: Diagnosis not present

## 2021-01-03 DIAGNOSIS — Z79899 Other long term (current) drug therapy: Secondary | ICD-10-CM | POA: Insufficient documentation

## 2021-01-03 DIAGNOSIS — G4733 Obstructive sleep apnea (adult) (pediatric): Secondary | ICD-10-CM

## 2021-01-03 DIAGNOSIS — E1169 Type 2 diabetes mellitus with other specified complication: Secondary | ICD-10-CM

## 2021-01-03 DIAGNOSIS — I10 Essential (primary) hypertension: Secondary | ICD-10-CM

## 2021-01-03 DIAGNOSIS — M5136 Other intervertebral disc degeneration, lumbar region: Secondary | ICD-10-CM

## 2021-01-03 DIAGNOSIS — E785 Hyperlipidemia, unspecified: Secondary | ICD-10-CM

## 2021-01-03 DIAGNOSIS — Z0000 Encounter for general adult medical examination without abnormal findings: Secondary | ICD-10-CM

## 2021-01-03 DIAGNOSIS — Z9989 Dependence on other enabling machines and devices: Secondary | ICD-10-CM

## 2021-01-03 DIAGNOSIS — Z125 Encounter for screening for malignant neoplasm of prostate: Secondary | ICD-10-CM

## 2021-01-03 LAB — CBC WITH DIFFERENTIAL/PLATELET
Abs Immature Granulocytes: 0.01 10*3/uL (ref 0.00–0.07)
Basophils Absolute: 0 10*3/uL (ref 0.0–0.1)
Basophils Relative: 1 %
Eosinophils Absolute: 0.3 10*3/uL (ref 0.0–0.5)
Eosinophils Relative: 5 %
HCT: 40.4 % (ref 39.0–52.0)
Hemoglobin: 13.7 g/dL (ref 13.0–17.0)
Immature Granulocytes: 0 %
Lymphocytes Relative: 20 %
Lymphs Abs: 1.1 10*3/uL (ref 0.7–4.0)
MCH: 30.1 pg (ref 26.0–34.0)
MCHC: 33.9 g/dL (ref 30.0–36.0)
MCV: 88.8 fL (ref 80.0–100.0)
Monocytes Absolute: 0.5 10*3/uL (ref 0.1–1.0)
Monocytes Relative: 8 %
Neutro Abs: 3.7 10*3/uL (ref 1.7–7.7)
Neutrophils Relative %: 66 %
Platelets: 187 10*3/uL (ref 150–400)
RBC: 4.55 MIL/uL (ref 4.22–5.81)
RDW: 13 % (ref 11.5–15.5)
WBC: 5.6 10*3/uL (ref 4.0–10.5)
nRBC: 0 % (ref 0.0–0.2)

## 2021-01-03 LAB — COMPREHENSIVE METABOLIC PANEL
ALT: 17 U/L (ref 0–44)
AST: 22 U/L (ref 15–41)
Albumin: 4.1 g/dL (ref 3.5–5.0)
Alkaline Phosphatase: 59 U/L (ref 38–126)
Anion gap: 9 (ref 5–15)
BUN: 16 mg/dL (ref 8–23)
CO2: 27 mmol/L (ref 22–32)
Calcium: 9.1 mg/dL (ref 8.9–10.3)
Chloride: 101 mmol/L (ref 98–111)
Creatinine, Ser: 1.14 mg/dL (ref 0.61–1.24)
GFR, Estimated: >60 mL/min (ref >60)
Glucose, Bld: 120 mg/dL — ABNORMAL HIGH (ref 70–99)
Potassium: 3.8 mmol/L (ref 3.5–5.1)
Sodium: 137 mmol/L (ref 135–145)
Total Bilirubin: 0.3 mg/dL (ref 0.3–1.2)
Total Protein: 7 g/dL (ref 6.5–8.1)

## 2021-01-03 LAB — TSH: TSH: 1.109 u[IU]/mL (ref 0.350–4.500)

## 2021-01-03 LAB — PSA: Prostatic Specific Antigen: <0.01 ng/mL (ref 0.00–4.00)

## 2021-01-03 NOTE — Progress Notes (Addendum)
Hematology/Oncology Consult note [facility]  Telephone:([phone] [phone]  Patient Care Team: Olin Hauser, DO as PCP - General (Family Medicine) Luan Pulling Ronelle Nigh., MD (Inactive) (Unknown Physician Specialty) Grace Isaac, MD (Inactive) as Consulting Physician (Cardiothoracic Surgery) Clance, Armando Reichert, MD as Consulting Physician (Pulmonary Disease) Grace Isaac, MD (Inactive) as Consulting Physician (Cardiothoracic Surgery) Clance, Armando Reichert, MD as Consulting Physician (Pulmonary Disease) Minor, Dalbert Garnet, RN (Inactive) as Case Manager Sindy Guadeloupe, MD as Consulting Physician (Hematology and Oncology)   Name of the patient: Eric Stone  [id number]  1946-01-28   Date of visit: 01/03/21  Diagnosis-castrate resistant nonmetastatic prostate cancer  Chief complaint/ Reason for visit-routine follow-up of prostate cancer on apalutamide  Heme/Onc history: : patient is a 75 year old male who was diagnosed with Gleason 6 adenocarcinoma of the prostateAbout 5 years ago and received radiation treatment.  He has not undergone the surgery for his prostate but has seen Dr. Bernardo Heater in the past.  He was receiving intermittent ADT and last received 45 mg of Eligard on 09/30/2018.  His PSA following that went down to 0.87 December 2020.  He did not receive any ADT after that and his most recent PSA on 09/24/2019 was elevated at 5.08.  Patient reports having significant hot flashes and fatigue when he gets ADT.    Patient presently on continuous ADT since July 2021 which she gets every 6 months.  Bone scan did not show any evidence of metastatic disease.  Patient noted to have PSA doubling time of about 3 months in August 2022.  CT abdomen and bone scan did not show any evidence of distant metastatic disease.Patient started on apalutamide for castrate resistant nonmetastatic prostate cancer    Interval history-patient does complain of ongoing  fatigue as well as self-limited hot flashes which have been ongoing even before he started apalutamide.  Does report some change in his taste sensation.  ECOG PS- 1 Pain scale- 0   Review of systems- Review of Systems  Constitutional:  Positive for malaise/fatigue. Negative for chills, fever and weight loss.  HENT:  Negative for congestion, ear discharge and nosebleeds.   Eyes:  Negative for blurred vision.  Respiratory:  Negative for cough, hemoptysis, sputum production, shortness of breath and wheezing.   Cardiovascular:  Negative for chest pain, palpitations, orthopnea and claudication.  Gastrointestinal:  Negative for abdominal pain, blood in stool, constipation, diarrhea, heartburn, melena, nausea and vomiting.  Genitourinary:  Negative for dysuria, flank pain, frequency, hematuria and urgency.  Musculoskeletal:  Negative for back pain, joint pain and myalgias.  Skin:  Negative for rash.  Neurological:  Negative for dizziness, tingling, focal weakness, seizures, weakness and headaches.  Endo/Heme/Allergies:  Does not bruise/bleed easily.       Hot flashes  Psychiatric/Behavioral:  Negative for depression and suicidal ideas. The patient does not have insomnia.      Allergies  Allergen Reactions   Penicillins Other (See Comments)    As a child     Past Medical History:  Diagnosis Date   Arthritis    H/O blood clots    H/O blood clots    in left leg in 1988   Hx of dysplastic nevus 06/18/2013   L lat ankle - mild   Melanoma ([place]) 02/21/2016   L prox med plantar great toe - Breslow's 1.58mm, Clark level early 4   Prostate cancer (HCC)    Rash    occasionally and pt does  see a dermatologist   Sleep apnea    uses a CPAP;sleep study >39yrs ago     Past Surgical History:  Procedure Laterality Date   TOE SURGERY     melanoma   TOTAL KNEE ARTHROPLASTY  2009-2010   bilateral   VIDEO ASSISTED THORACOSCOPY (VATS)/DECORTICATION Left 05/06/2012   Procedure: VIDEO ASSISTED  THORACOSCOPY (VATS) drainage loculated pleural effusion;  Surgeon: Grace Isaac, MD;  Location: [facility];  Service: Thoracic;  Laterality: Left;   VIDEO BRONCHOSCOPY N/A 05/06/2012   Procedure: VIDEO BRONCHOSCOPY;  Surgeon: Grace Isaac, MD;  Location[facility];  Service: Thoracic;  Laterality: N/A;    Social History   Socioeconomic History   Marital status: Married    Spouse name: Not on file   Number of children: Not on file   Years of education: tech school   Highest education level: High school graduate  Occupational History   Occupation: retired  Tobacco Use   Smoking status: Former    Packs/day: 1.00    Years: 26.00    Pack years: 26.00    Types: Cigarettes    Quit date: 03/26/1989    Years since quitting: 31.7   Smokeless tobacco: Former   Tobacco comments:    less than 1 PPD  Vaping Use   Vaping Use: Never used  Substance and Sexual Activity   Alcohol use: No    Alcohol/week: 0.0 standard drinks   Drug use: No   Sexual activity: Yes  Other Topics Concern   Not on file  Social History Narrative   Not on file   Social Determinants of Health   Financial Resource Strain: Not on file  Food Insecurity: Not on file  Transportation Needs: Not on file  Physical Activity: Not on file  Stress: Not on file  Social Connections: Not on file  Intimate Partner Violence: Not on file    Family History  Problem Relation Age of Onset   Hypertension Mother    Stroke Mother        possible   Cancer Brother      Current Outpatient Medications:    apalutamide (ERLEADA) 60 MG tablet, Take 4 tablets (240 mg total) by mouth daily., Disp: 120 tablet, Rfl: 5   aspirin EC 81 MG tablet, Take 81 mg by mouth., Disp: , Rfl:    hydrochlorothiazide (HYDRODIURIL) 12.5 MG tablet, Take 1 tablet (12.5 mg total) by mouth daily., Disp: 90 tablet, Rfl: 3   lisinopril (ZESTRIL) 30 MG tablet, Take 1 tablet (30 mg total) by mouth daily., Disp: 90 tablet, Rfl: 3   pimecrolimus (ELIDEL) 1 %  cream, APPLY TO AFFECTED AREA GROIN TWICE DAILY AS DIRECTED, Disp: 60 g, Rfl: 5   rosuvastatin (CRESTOR) 10 MG tablet, Take 1 tablet (10 mg total) by mouth at bedtime., Disp: 90 tablet, Rfl: 3  Physical exam: There were no vitals filed for this visit. Physical Exam   CMP Latest Ref Rng & Units 01/03/2021  Glucose 70 - 99 mg/dL 120(H)  BUN 8 - 23 mg/dL 16  Creatinine 0.61 - 1.24 mg/dL 1.14  Sodium 135 - 145 mmol/L 137  Potassium 3.5 - 5.1 mmol/L 3.8  Chloride 98 - 111 mmol/L 101  CO2 22 - 32 mmol/L 27  Calcium 8.9 - 10.3 mg/dL 9.1  Total Protein 6.5 - 8.1 g/dL 7.0  Total Bilirubin 0.3 - 1.2 mg/dL 0.3  Alkaline Phos 38 - 126 U/L 59  AST 15 - 41 U/L 22  ALT 0 -  44 U/L 17   CBC Latest Ref Rng & Units 01/03/2021  WBC 4.0 - 10.5 K/uL 5.6  Hemoglobin 13.0 - 17.0 g/dL 13.7  Hematocrit 39.0 - 52.0 % 40.4  Platelets 150 - 400 K/uL 187      Assessment and plan- Patient is a 75 y.o. male with castrate resistant nonmetastatic prostate cancer currently on Eligard and apalutamide here for routine follow-up  After starting apalutamide patient's PSA which had gone up to 4.6Decreased to 0.063 weeks ago.  Levels from today are pending.  Discussed with the patient the natural history of prostate cancer and difference between castrate sensitive and castrate resistant disease.  Ideally patient needs to stay on apalutamide until progression or toxicity.  Again discussed risks and benefits of apalutamide including all but not limited to fatigue, hot flashes, worsening bone health gynecomastia anemia and loss of muscle mass.  Patient agrees to continue apalutamide at this time.  He would be due for his next Eligard sometime in February 2022 which we will schedule and I will see him at that time with a CBC with differential CMP and PSA.  I will obtain a bone density scan prior to my next visit.  ADT can be associated with worsening osteopenia and osteoporosis.  Patients who are on ADT I recommended to have  bone density scans for monitoring of bone health.  He will be seen by Despina Hick from pharmacy in 2 months for toxicity check with labs   Visit Diagnosis 1. Malignant neoplasm of prostate ([place])   2. High risk medication use      Dr. Randa Evens, MD, MPH [facility] [phone] 01/03/2021 3:07 PM

## 2021-01-04 ENCOUNTER — Other Ambulatory Visit: Payer: PPO

## 2021-01-04 DIAGNOSIS — I10 Essential (primary) hypertension: Secondary | ICD-10-CM | POA: Diagnosis not present

## 2021-01-04 DIAGNOSIS — E785 Hyperlipidemia, unspecified: Secondary | ICD-10-CM | POA: Diagnosis not present

## 2021-01-04 DIAGNOSIS — Z0000 Encounter for general adult medical examination without abnormal findings: Secondary | ICD-10-CM | POA: Diagnosis not present

## 2021-01-04 DIAGNOSIS — Z125 Encounter for screening for malignant neoplasm of prostate: Secondary | ICD-10-CM | POA: Diagnosis not present

## 2021-01-04 DIAGNOSIS — G4733 Obstructive sleep apnea (adult) (pediatric): Secondary | ICD-10-CM | POA: Diagnosis not present

## 2021-01-04 DIAGNOSIS — M5136 Other intervertebral disc degeneration, lumbar region: Secondary | ICD-10-CM | POA: Diagnosis not present

## 2021-01-04 DIAGNOSIS — E1169 Type 2 diabetes mellitus with other specified complication: Secondary | ICD-10-CM | POA: Diagnosis not present

## 2021-01-04 DIAGNOSIS — Z9989 Dependence on other enabling machines and devices: Secondary | ICD-10-CM | POA: Diagnosis not present

## 2021-01-05 LAB — PSA: PSA: <0.04 ng/mL (ref <=4.00)

## 2021-01-05 LAB — COMPLETE METABOLIC PANEL WITH GFR
AG Ratio: 1.6 (calc) (ref 1.0–2.5)
ALT: 12 U/L (ref 9–46)
AST: 16 U/L (ref 10–35)
Albumin: 4.1 g/dL (ref 3.6–5.1)
Alkaline phosphatase (APISO): 59 U/L (ref 35–144)
BUN: 19 mg/dL (ref 7–25)
CO2: 31 mmol/L (ref 20–32)
Calcium: 9.3 mg/dL (ref 8.6–10.3)
Chloride: 103 mmol/L (ref 98–110)
Creat: 1.17 mg/dL (ref 0.70–1.28)
Globulin: 2.5 g/dL (calc) (ref 1.9–3.7)
Glucose, Bld: 118 mg/dL — ABNORMAL HIGH (ref 65–99)
Potassium: 4.4 mmol/L (ref 3.5–5.3)
Sodium: 141 mmol/L (ref 135–146)
Total Bilirubin: 0.4 mg/dL (ref 0.2–1.2)
Total Protein: 6.6 g/dL (ref 6.1–8.1)
eGFR: 66 mL/min/{1.73_m2} (ref >=60)

## 2021-01-05 LAB — CBC WITH DIFFERENTIAL/PLATELET
Absolute Monocytes: 510 cells/uL (ref 200–950)
Basophils Absolute: 40 cells/uL (ref 0–200)
Basophils Relative: 0.8 %
Eosinophils Absolute: 310 cells/uL (ref 15–500)
Eosinophils Relative: 6.2 %
HCT: 41.3 % (ref 38.5–50.0)
Hemoglobin: 13.4 g/dL (ref 13.2–17.1)
Lymphs Abs: 1365 cells/uL (ref 850–3900)
MCH: 29.4 pg (ref 27.0–33.0)
MCHC: 32.4 g/dL (ref 32.0–36.0)
MCV: 90.6 fL (ref 80.0–100.0)
MPV: 10.7 fL (ref 7.5–12.5)
Monocytes Relative: 10.2 %
Neutro Abs: 2775 cells/uL (ref 1500–7800)
Neutrophils Relative %: 55.5 %
Platelets: 189 10*3/uL (ref 140–400)
RBC: 4.56 10*6/uL (ref 4.20–5.80)
RDW: 12.9 % (ref 11.0–15.0)
Total Lymphocyte: 27.3 %
WBC: 5 10*3/uL (ref 3.8–10.8)

## 2021-01-05 LAB — HEMOGLOBIN A1C
Hgb A1c MFr Bld: 6.4 % of total Hgb — ABNORMAL HIGH (ref <5.7)
Mean Plasma Glucose: 137 mg/dL
eAG (mmol/L): 7.6 mmol/L

## 2021-01-11 ENCOUNTER — Encounter: Payer: Self-pay | Admitting: Family Medicine

## 2021-01-11 ENCOUNTER — Other Ambulatory Visit: Payer: Self-pay

## 2021-01-11 ENCOUNTER — Ambulatory Visit (INDEPENDENT_AMBULATORY_CARE_PROVIDER_SITE_OTHER): Payer: PPO | Admitting: Family Medicine

## 2021-01-11 VITALS — BP 125/61 | HR 55 | Ht 70.0 in | Wt 240.2 lb

## 2021-01-11 DIAGNOSIS — Z0000 Encounter for general adult medical examination without abnormal findings: Secondary | ICD-10-CM

## 2021-01-11 DIAGNOSIS — Z89412 Acquired absence of left great toe: Secondary | ICD-10-CM | POA: Diagnosis not present

## 2021-01-11 DIAGNOSIS — G4733 Obstructive sleep apnea (adult) (pediatric): Secondary | ICD-10-CM | POA: Diagnosis not present

## 2021-01-11 DIAGNOSIS — E785 Hyperlipidemia, unspecified: Secondary | ICD-10-CM

## 2021-01-11 DIAGNOSIS — E1169 Type 2 diabetes mellitus with other specified complication: Secondary | ICD-10-CM | POA: Diagnosis not present

## 2021-01-11 DIAGNOSIS — I10 Essential (primary) hypertension: Secondary | ICD-10-CM | POA: Diagnosis not present

## 2021-01-11 DIAGNOSIS — Z23 Encounter for immunization: Secondary | ICD-10-CM

## 2021-01-11 DIAGNOSIS — Z9989 Dependence on other enabling machines and devices: Secondary | ICD-10-CM | POA: Diagnosis not present

## 2021-01-11 DIAGNOSIS — C61 Malignant neoplasm of prostate: Secondary | ICD-10-CM | POA: Diagnosis not present

## 2021-01-11 NOTE — Progress Notes (Signed)
Subjective:    Patient ID: Eric Daft., male    DOB: 17-May-1945, 75 y.o.   MRN: [id number]  Eric Briggs. is a 75 y.o. male presenting on 01/11/2021 for Annual Exam   HPI  Here for Annual Physical and Lab Review.  CHRONIC HTN: Controlled. Not checking regularly now. Current Meds - Lisinopril 88m daily, HCTZ 12.551mdaily Reports good compliance, took meds today. Tolerating well, w/o complaints. - Admits mild varicose veins edema, but improved on compression stocking Denies CP, dyspnea, HA, dizziness / lightheadedness   Type 2 Diabetes Lab result A1c 6.4 on A1c, today due for A1c CBGs Never checked. Meds: never on meds Currently on ACEi Lifestyle: - Diet (Trying to maintain improved diet, reduce carbs portions, inc water) - Exercise (Improved activity and exercise) - S/p L Great Toe Amputation Next DM Eye Exam 12/2020 Dr ChTerrilee Filesenies hypoglycemia, polyuria, visual changes, numbness or tingling.  HYPERLIPIDEMIA: - Reports no concerns. - Currently taking Rosuvastatin 1049mtolerating well without side effects or myalgias   OSA, on CPAP - Patient reports prior history of dx OSA and on CPAP for >20 years, prior to treatment his initial symptoms were snoring, daytime sleepiness and fatigue, he has had several sleep studies in the past. Most recent 4/205-15-16ith AHI 6.8 per hour, and mild to moderate OSA. - He reports that his sleep apnea is well controlled. He uses the CPAP machine every night. He tolerates the machine well, and thinks that he sleeps better with it and feels good. No new concerns or symptoms.    PMH - Melanoma, Left big toe / History of Skin Cancer S[facility]p [facility]putation [facility] Low Back Pain, with osteoarthritis Has [place]ance [place]per [place], copied below of lumbar spine, has gradually improved, advanced OA/DJD, no other intervention. He can return if worsening.   S/p prostate cancer Followed by Dr  RaoJanese Banksd Dr ChrBaruch Goutyr Prostate Cancer - Last lab PSA improved from 1.72 down to 1.6 down to 1.51 (07/07/20) - He had elevated PSA up to 4.6, then switched to chemo pill medication and continue injection, recent Oncology visit with Dr RaoJanese Banksowed improved PSA to 0.06 - Now repeat PSA shows < 0.04     Health Maintenance:  Flu Shot today  - [facility]. Now completed Cologuard 12/2018 next due in 12/2021 - UTD TDap 2016 - Completed routine Hep C screening, Negative 12/2016   - UTD Pneumonia vaccine, received Pneumovax-23 at age 69 82014), and then next dose was Prevnar-13 12/2013, has received both after age 69,20herefore completed   [place] pharmacy [place]  [facility] 01/11/2021 01/12/2020 06/12/2019  Decreased Interest 0 0 0  Down, Depressed, Hopeless 0 0 0  PHQ - 2 Score 0 0 0  Altered sleeping 0 - -  Tired, decreased energy 1 - -  Change in appetite 0 - -  Feeling bad or failure about yourself  0 - -  Trouble concentrating 0 - -  Moving slowly or fidgety/restless 0 - -  Suicidal thoughts - - -  PHQ-9 Score 1 - -  Difficult doing work/chores Not difficult at all - -    Past Medical History:  Diagnosis Date   Arthritis    H/O blood clots    H/O blood clots    in left leg  in 1988   Hx of dysplastic nevus 06/18/2013   L lat ankle - mild   Melanoma ([place]) 02/21/2016   L prox med plantar great toe - Breslow's 1.102m, Clark level early 4   Prostate cancer (HCC)    Rash    occasionally and pt does see a dermatologist   Sleep apnea    uses a CPAP;sleep study >511yrago   Past Surgical History:  Procedure Laterality Date   TOE SURGERY     melanoma   TOTAL KNEE ARTHROPLASTY  2009-2010   bilateral   VIDEO ASSISTED THORACOSCOPY (VATS)/DECORTICATION Left 05/06/2012   Procedure: VIDEO ASSISTED THORACOSCOPY (VATS) drainage loculated pleural effusion;  Surgeon: EdGrace IsaacMD;  [facility]: Thor[facility];  Laterality: Left;   VIDEO BRONCHOSCOPY N/A 05/06/2012   Procedure: VIDEO BRONCHOSCOPY;  Surgeon: EdGrace IsaacMD;  [facility]: [facility];  Laterality: N/A;   Social History   Socioeconomic History   Marital status: Married    Spouse name: Not on file   Number of children: Not on file   Years of education: tech school   Highest education level: High school graduate  Occupational History   Occupation: retired  Tobacco Use   Smoking status: Former    Packs/day: 1.00    Years: 26.00    Pack years: 26.00    Types: Cigarettes    Quit date: 03/26/1989    Years since quitting: 31.8   Smokeless tobacco: Former   Tobacco comments:    less than 1 PPD  Vaping Use   Vaping Use: Never used  Substance and Sexual Activity   Alcohol use: No    Alcohol/week: 0.0 standard drinks   Drug use: No   Sexual activity: Yes  Other Topics Concern   Not on file  Social History Narrative   Not on file   Social Determinants of Health   Financial Resource Strain: Not on file  Food Insecurity: Not on file  Transportation Needs: Not on file  Physical Activity: Not on file  Stress: Not on file  Social Connections: Not on file  Intimate Partner Violence: Not on file   Family History  Problem Relation Age of Onset   Hypertension Mother    Stroke Mother        possible   Cancer Brother    Current Outpatient Medications on File Prior to Visit  Medication Sig   apalutamide (ERLEADA) 60 MG tablet Take 4 tablets (240 mg total) by mouth daily.   aspirin EC 81 MG tablet Take 81 mg by mouth.   hydrochlorothiazide (HYDRODIURIL) 12.5 MG tablet Take 1 tablet (12.5 mg total) by mouth daily.   lisinopril (ZESTRIL) 30 MG tablet Take 1 tablet (30 mg total) by mouth daily.   pimecrolimus (ELIDEL) 1 % cream APPLY TO AFFECTED AREA GROIN TWICE DAILY AS DIRECTED   rosuvastatin (CRESTOR) 10 MG tablet Take 1 tablet (10 mg total) by mouth at  bedtime.   No current facility-administered medications on file prior to visit.    Review of Systems  Constitutional:  Negative for activity change, appetite change, chills, diaphoresis, fatigue and fever.  HENT:  Negative for congestion and hearing loss.   Eyes:  Negative for visual disturbance.  Respiratory:  Negative for cough, chest tightness, shortness of breath and wheezing.   Cardiovascular:  Negative for chest pain, palpitations and leg swelling.  Gastrointestinal:  Negative for abdominal pain, constipation, diarrhea, nausea and vomiting.  Genitourinary:  Negative  for dysuria, frequency and hematuria.  Musculoskeletal:  Negative for arthralgias and neck pain.  Skin:  Negative for rash.  Neurological:  Negative for dizziness, weakness, light-headedness, numbness and headaches.  Hematological:  Negative for adenopathy.  Psychiatric/Behavioral:  Negative for behavioral problems, dysphoric mood and sleep disturbance.   Per HPI unless specifically indicated above      Objective:    BP 125/61   Pulse (!) 55   Ht 5' 10"  (1.778 m)   Wt 240 lb 3.2 oz (109 kg)   SpO2 96%   BMI 34.47 kg/m   Wt Readings from Last 3 Encounters:  01/11/21 240 lb 3.2 oz (109 kg)  11/14/20 242 lb (109.8 kg)  10/25/20 239 lb 1.6 oz (108.5 kg)    Physical Exam Vitals and nursing note reviewed.  Constitutional:      General: He is not in acute distress.    Appearance: He is well-developed. He is not diaphoretic.     Comments: Well-appearing, comfortable, cooperative  HENT:     Head: Normocephalic and atraumatic.  Eyes:     General:        Right eye: No discharge.        Left eye: No discharge.     Conjunctiva/sclera: Conjunctivae normal.     Pupils: Pupils are equal, round, and reactive to light.  Neck:     Thyroid: No thyromegaly.     Vascular: No carotid bruit.  Cardiovascular:     Rate and Rhythm: Normal rate and regular rhythm.     Pulses: Normal pulses.     Heart sounds: Normal heart  sounds. No murmur heard. Pulmonary:     Effort: Pulmonary effort is normal. No respiratory distress.     Breath sounds: Normal breath sounds. No wheezing or rales.  Abdominal:     General: Bowel sounds are normal. There is no distension.     Palpations: Abdomen is soft. There is no mass.     Tenderness: There is no abdominal tenderness.  Musculoskeletal:        General: No tenderness. Normal range of motion.     Cervical back: Normal range of motion and neck supple.     Right lower leg: No edema.     Left lower leg: No edema.     Comments: Upper / Lower Extremities: - Normal muscle tone, strength bilateral upper extremities 5/5, lower extremities 5/5  Lymphadenopathy:     Cervical: No cervical adenopathy.  Skin:    General: Skin is warm and dry.     Findings: No erythema or rash.  Neurological:     Mental Status: He is alert and oriented to person, place, and time.     Comments: Distal sensation intact to light touch all extremities  Psychiatric:        Mood and Affect: Mood normal.        Behavior: Behavior normal.        Thought Content: Thought content normal.     Comments: Well groomed, good eye contact, normal speech and thoughts    Diabetic Foot Exam - Simple   Simple Foot Form Diabetic Foot exam was performed with the following findings: Yes 01/11/2021  9:34 AM  Visual Inspection See comments: Yes Sensation Testing Intact to touch and monofilament testing bilaterally: Yes Pulse Check Posterior Tibialis and Dorsalis pulse intact bilaterally: Yes Comments S/p left great toe amputation well healed. Intact monofilament sensation. Varicose veins. Mild callus formation forefoot, no ulceration.  Results for orders placed or performed in visit on 01/03/21  PSA  Result Value Ref Range   PSA <0.04 < OR = 4.00 ng/mL  COMPLETE METABOLIC PANEL WITH GFR  Result Value Ref Range   Glucose, Bld 118 (H) 65 - 99 mg/dL   BUN 19 7 - 25 mg/dL   Creat 1.17 0.70 - 1.28 mg/dL    eGFR 66 > OR = 60 mL/min/1.42m   BUN/Creatinine Ratio NOT APPLICABLE 6 - 22 (calc)   Sodium 141 135 - 146 mmol/L   Potassium 4.4 3.5 - 5.3 mmol/L   Chloride 103 98 - 110 mmol/L   CO2 31 20 - 32 mmol/L   Calcium 9.3 8.6 - 10.3 mg/dL   Total Protein 6.6 6.1 - 8.1 g/dL   Albumin 4.1 3.6 - 5.1 g/dL   Globulin 2.5 1.9 - 3.7 g/dL (calc)   AG Ratio 1.6 1.0 - 2.5 (calc)   Total Bilirubin 0.4 0.2 - 1.2 mg/dL   Alkaline phosphatase (APISO) 59 35 - 144 U/L   AST 16 10 - 35 U/L   ALT 12 9 - 46 U/L  CBC with Differential/Platelet  Result Value Ref Range   WBC 5.0 3.8 - 10.8 Thousand/uL   RBC 4.56 4.20 - 5.80 Million/uL   Hemoglobin 13.4 13.2 - 17.1 g/dL   HCT 41.3 38.5 - 50.0 %   MCV 90.6 80.0 - 100.0 fL   MCH 29.4 27.0 - 33.0 pg   MCHC 32.4 32.0 - 36.0 g/dL   RDW 12.9 11.0 - 15.0 %   Platelets 189 140 - 400 Thousand/uL   MPV 10.7 7.5 - 12.5 fL   Neutro Abs 2,775 1,500 - 7,800 cells/uL   Lymphs Abs 1,365 850 - 3,900 cells/uL   Absolute Monocytes 510 200 - 950 cells/uL   Eosinophils Absolute 310 15 - 500 cells/uL   Basophils Absolute 40 0 - 200 cells/uL   Neutrophils Relative % 55.5 %   Total Lymphocyte 27.3 %   Monocytes Relative 10.2 %   Eosinophils Relative 6.2 %   Basophils Relative 0.8 %  Hemoglobin A1c  Result Value Ref Range   Hgb A1c MFr Bld 6.4 (H) <5.7 % of total Hgb   Mean Plasma Glucose 137 mg/dL   eAG (mmol/L) 7.6 mmol/L      Assessment & Plan:   Problem List Items Addressed This Visit     Type 2 diabetes mellitus with other specified complication (HCC)    Controlled type 2 diabetes, A1c 6.4 today, still stable at goal < 7-8 No hypoglycemia or hyperglycemia Complications - hyperlipidemia, OSA - increases risk of future cardiovascular complications   Plan:  - Not on medication for DM, it is diet controlled - Encourage improved lifestyle - low carb, low sugar diet, reduce portion size, continue improving regular exercise - May continue chronic case management  for comprehensive nursing education on diabetes among other benefits - Continue ASA, ACEi, Statin DM Foot today  Future DM Eye Dr [place] 12/2020      OSA on CPAP    Well controlled, chronic mild-mod OSA on CPAP - Good adherence to CPAP nightly - Continue current CPAP therapy, patient is benefiting from therapy      M[place]ant [place] of prostate [place]    Followed by Dr CEsperanza HeirOnc S/p rad therapy, no surgical removal Stable monitoring PSA per Oncology PSA has responded to chemo / injection - improved on repeat < 0.04      Hyperlipidemia associated with  type 2 diabetes mellitus (HCC)    Controlled cholesterol on lifestyle The ASCVD Risk score (Arnett DK, et al., 2019) failed to calculate for the following reasons:   The valid total cholesterol range is 130 to 320 mg/dL  Plan: 1. Continue Rosuvastatin 40m nightly 2. Continue ASA 883mfor primary ASCVD risk reduction 3. Encourage improved lifestyle - low carb/cholesterol, reduce portion size, continue improving regular exercise      History of amputation of left great toe (HCC)    S/p L great toe amp due to melanoma      Essential hypertension    Controlled - Home BP readings not available No known complications  OFF Furosemide    Plan:  1. Continue HCTZ 12.58m53maily for HTN and also likely for edema, continue Lisinopril 78m31mily 2. Encourage improved lifestyle - low sodium diet, improve regular exercise 3. Continue monitor BP outside office, bring readings to next visit, if persistently >140/90 or new symptoms notify office sooner      Other Visit Diagnoses     Annual physical exam    -  Primary   Needs flu shot       Relevant Orders   Flu Vaccine QUAD High Dose(Fluad)       Updated Health Maintenance information Flu Shot today Updated covid booster at pharmacy Reviewed recent lab results with patient Encouraged improvement to lifestyle with diet and exercise Goal of weight loss   No  orders of the defined types were placed in this encounter.     Follow up plan: Return in about 6 months (around 07/12/2021), or if symptoms worsen or fail to improve, for 6 month follow-up DM A1c, oncology updates.  AlexNobie Putnam [place] 01/11/2021, 9:27 AM

## 2021-01-11 NOTE — Assessment & Plan Note (Signed)
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 

## 2021-01-11 NOTE — Assessment & Plan Note (Signed)
Followed by Dr Esperanza Heir Onc S/p rad therapy, no surgical removal Stable monitoring PSA per Oncology PSA has responded to chemo / injection - improved on repeat < 0.04

## 2021-01-11 NOTE — Assessment & Plan Note (Signed)
Controlled - Home BP readings not available No known complications  OFF Furosemide    Plan:  1. Continue HCTZ 12.5mg  daily for HTN and also likely for edema, continue Lisinopril 30mg  daily 2. Encourage improved lifestyle - low sodium diet, improve regular exercise 3. Continue monitor BP outside office, bring readings to next visit, if persistently >140/90 or new symptoms notify office sooner

## 2021-01-11 NOTE — Assessment & Plan Note (Signed)
Controlled type 2 diabetes, A1c 6.4 today, still stable at goal < 7-8 No hypoglycemia or hyperglycemia Complications - hyperlipidemia, OSA - increases risk of future cardiovascular complications   Plan:  - Not on medication for DM, it is diet controlled - Encourage improved lifestyle - low carb, low sugar diet, reduce portion size, continue improving regular exercise - May continue chronic case management for comprehensive nursing education on diabetes among other benefits - Continue ASA, ACEi, Statin DM Foot today  Future DM Eye Dr Terrilee Files 12/2020

## 2021-01-11 NOTE — Patient Instructions (Addendum)
Thank you for coming to the office today.  Please ask your questions to your Oncology Team  You can call them or message or ask them at next visit. You have apt with the pharmacist Alyson.  Ask about:  How long would need to be on chemotherapy pill - could it be held for period of time and restarted in future if need? Could prostate be surgically removed as an option?  Labs look great overall. PSA has responded. A1c sugar is controlled.   Please schedule a Follow-up Appointment to: Return in about 6 months (around 07/12/2021), or if symptoms worsen or fail to improve, for 6 month follow-up DM A1c, oncology updates.  If you have any other questions or concerns, please feel free to call the office or send a message through [place]. You may also schedule an earlier appointment if necessary.  Additionally, you may be receiving a survey about your experience at our office within a few days to 1 week by e-mail or mail. We value your feedback.  [name], DO [place]

## 2021-01-11 NOTE — Assessment & Plan Note (Signed)
S/p L great toe amp due to melanoma

## 2021-01-11 NOTE — Assessment & Plan Note (Signed)
Controlled cholesterol on lifestyle The ASCVD Risk score (Arnett DK, et al., 2019) failed to calculate for the following reasons:   The valid total cholesterol range is 130 to 320 mg/dL  Plan: 1. Continue Rosuvastatin 10mg  nightly 2. Continue ASA 81mg  for primary ASCVD risk reduction 3. Encourage improved lifestyle - low carb/cholesterol, reduce portion size, continue improving regular exercise

## 2021-01-13 ENCOUNTER — Telehealth: Payer: Self-pay | Admitting: Pharmacy Technician

## 2021-01-13 NOTE — Telephone Encounter (Signed)
Oral Oncology Patient Advocate Encounter  Was successful in securing patient a $8,000 grant from [employer] to provide copayment coverage for [place].  This will keep the out of pocket expense at $0.     Healthwell ID: [id number]   The billing information is as follows and has been shared with [place].    RxBin: [id number] PCN: [id number] Member ID: [id number] Group ID: [id number] Dates of Eligibility: 12/12/20 through 12/11/21  Fund:  [place] Patient [place] Phone [phone] Fax [phone] 01/13/2021 11:41 AM

## 2021-01-16 ENCOUNTER — Other Ambulatory Visit (HOSPITAL_COMMUNITY): Payer: Self-pay

## 2021-01-16 ENCOUNTER — Encounter: Payer: Self-pay | Admitting: Oncology

## 2021-01-25 ENCOUNTER — Other Ambulatory Visit: Payer: PPO

## 2021-02-09 ENCOUNTER — Other Ambulatory Visit: Payer: Self-pay | Admitting: Family Medicine

## 2021-02-09 DIAGNOSIS — E7800 Pure hypercholesterolemia, unspecified: Secondary | ICD-10-CM

## 2021-02-09 NOTE — Telephone Encounter (Signed)
Requested medications are due for refill today.  yes  Requested medications are on the active medications list.  yes  Last refill. 01/12/2020  Future visit scheduled.   yes  Notes to clinic.  Labs are expired.

## 2021-02-11 ENCOUNTER — Other Ambulatory Visit: Payer: Self-pay | Admitting: Family Medicine

## 2021-02-11 DIAGNOSIS — E7800 Pure hypercholesterolemia, unspecified: Secondary | ICD-10-CM

## 2021-02-12 NOTE — Telephone Encounter (Signed)
Spoke with Marcie Bal (Pharmacist) to refuse duplicate request. Requested too early. 02/09/21 #90 days.

## 2021-03-02 ENCOUNTER — Encounter: Payer: Self-pay | Admitting: Oncology

## 2021-03-02 ENCOUNTER — Other Ambulatory Visit (HOSPITAL_COMMUNITY): Payer: Self-pay

## 2021-03-06 ENCOUNTER — Inpatient Hospital Stay: Payer: PPO

## 2021-03-06 ENCOUNTER — Inpatient Hospital Stay: Payer: PPO | Attending: Oncology | Admitting: Pharmacist

## 2021-03-06 ENCOUNTER — Other Ambulatory Visit: Payer: Self-pay

## 2021-03-06 VITALS — BP 127/80 | HR 58 | Temp 96.7°F | Resp 16 | Wt 238.9 lb

## 2021-03-06 DIAGNOSIS — C61 Malignant neoplasm of prostate: Secondary | ICD-10-CM

## 2021-03-06 DIAGNOSIS — Z79899 Other long term (current) drug therapy: Secondary | ICD-10-CM

## 2021-03-06 LAB — CBC WITH DIFFERENTIAL/PLATELET
Abs Immature Granulocytes: 0.02 10*3/uL (ref 0.00–0.07)
Basophils Absolute: 0.1 10*3/uL (ref 0.0–0.1)
Basophils Relative: 1 %
Eosinophils Absolute: 0.3 10*3/uL (ref 0.0–0.5)
Eosinophils Relative: 5 %
HCT: 40 % (ref 39.0–52.0)
Hemoglobin: 13.4 g/dL (ref 13.0–17.0)
Immature Granulocytes: 0 %
Lymphocytes Relative: 20 %
Lymphs Abs: 1.3 10*3/uL (ref 0.7–4.0)
MCH: 29.9 pg (ref 26.0–34.0)
MCHC: 33.5 g/dL (ref 30.0–36.0)
MCV: 89.3 fL (ref 80.0–100.0)
Monocytes Absolute: 0.7 10*3/uL (ref 0.1–1.0)
Monocytes Relative: 10 %
Neutro Abs: 4.3 10*3/uL (ref 1.7–7.7)
Neutrophils Relative %: 64 %
Platelets: 224 10*3/uL (ref 150–400)
RBC: 4.48 MIL/uL (ref 4.22–5.81)
RDW: 13.2 % (ref 11.5–15.5)
WBC: 6.6 10*3/uL (ref 4.0–10.5)
nRBC: 0 % (ref 0.0–0.2)

## 2021-03-06 LAB — COMPREHENSIVE METABOLIC PANEL
ALT: 17 U/L (ref 0–44)
AST: 21 U/L (ref 15–41)
Albumin: 3.9 g/dL (ref 3.5–5.0)
Alkaline Phosphatase: 60 U/L (ref 38–126)
Anion gap: 10 (ref 5–15)
BUN: 22 mg/dL (ref 8–23)
CO2: 27 mmol/L (ref 22–32)
Calcium: 9 mg/dL (ref 8.9–10.3)
Chloride: 100 mmol/L (ref 98–111)
Creatinine, Ser: 1.2 mg/dL (ref 0.61–1.24)
GFR, Estimated: >60 mL/min (ref >60)
Glucose, Bld: 116 mg/dL — ABNORMAL HIGH (ref 70–99)
Potassium: 3.7 mmol/L (ref 3.5–5.1)
Sodium: 137 mmol/L (ref 135–145)
Total Bilirubin: 0.5 mg/dL (ref 0.3–1.2)
Total Protein: 7.1 g/dL (ref 6.5–8.1)

## 2021-03-06 LAB — PSA: Prostatic Specific Antigen: <0.01 ng/mL (ref 0.00–4.00)

## 2021-03-06 NOTE — Progress Notes (Signed)
Pt has no concerns at this time. 

## 2021-03-06 NOTE — Progress Notes (Signed)
[place]  [phone] [phone]  Patient Care Team: Olin Hauser, DO as PCP - General (Family Medicine) Luan Pulling Ronelle Nigh., MD (Inactive) (Unknown Physician Specialty) Grace Isaac, MD (Inactive) as Consulting Physician (Cardiothoracic Surgery) Clance, Armando Reichert, MD as Consulting Physician (Pulmonary Disease) Grace Isaac, MD (Inactive) as Consulting Physician (Cardiothoracic Surgery) Clance, Armando Reichert, MD as Consulting Physician (Pulmonary Disease) Minor, Dalbert Garnet, RN (Inactive) as Case Manager Sindy Guadeloupe, MD as Consulting Physician (Hematology and Oncology)   Name of the patient: Eric Stone  [id number]  19-May-1945   Date of visit: 03/06/21  HPI: Patient is a 75 y.o. male with non-metastatic castration resistant prostate cancer. Currently treated with Erleada (apalutamide) started 11/18/20 and ADT.   Reason for Consult: Oral chemotherapy follow-up for apalutamide therapy.   PAST MEDICAL HISTORY: Past Medical History:  Diagnosis Date   Arthritis    H/O blood clots    H/O blood clots    in left leg in 1988   Hx of dysplastic nevus 06/18/2013   L lat ankle - mild   Melanoma ([place]) 02/21/2016   L prox med plantar great toe - Breslow's 1.52mm, Clark level early 4   Prostate cancer ([place])    Rash    occasionally and pt does see a dermatologist   Sleep apnea    uses a CPAP;sleep study >66yrs ago    HEMATOLOGY/ONCOLOGY HISTORY:  Oncology History   No history exists.    ALLERGIES:  is allergic to penicillins.  MEDICATIONS:  Current Outpatient Medications  Medication Sig Dispense Refill   apalutamide (ERLEADA) 60 MG tablet Take 4 tablets (240 mg total) by mouth daily. 120 tablet 5   aspirin EC 81 MG tablet Take 81 mg by mouth.     hydrochlorothiazide (HYDRODIURIL) 12.5 MG tablet Take 1 tablet (12.5 mg total) by mouth daily. 90 tablet 3   lisinopril (ZESTRIL) 30 MG tablet Take 1 tablet  (30 mg total) by mouth daily. 90 tablet 3   pimecrolimus (ELIDEL) 1 % cream APPLY TO AFFECTED AREA GROIN TWICE DAILY AS DIRECTED 60 g 5   rosuvastatin (CRESTOR) 10 MG tablet TAKE 1 TABLET BY MOUTH AT BEDTIME 90 tablet 0   No current facility-administered medications for this visit.    VITAL SIGNS: BP 127/80   Pulse (!) 58   Temp (!) 96.7 F (35.9 C) (Tympanic)   Resp 16   Wt 108.4 kg (238 lb 14.4 oz)   SpO2 99%   BMI 34.28 kg/m  Filed Weights   03/06/21 1005  Weight: 108.4 kg (238 lb 14.4 oz)    Estimated body mass index is 34.28 kg/m as calculated from the following:   Height as of 01/11/21: 5\' 10"  (1.778 m).   Weight as of this encounter: 108.4 kg (238 lb 14.4 oz).  LABS: CBC:    Component Value Date/Time   WBC 6.6 03/06/2021 0941   HGB 13.4 03/06/2021 0941   HGB 15.0 07/12/2015 0910   HCT 40.0 03/06/2021 0941   HCT 43.3 07/12/2015 0910   PLT 224 03/06/2021 0941   PLT 204 07/12/2015 0910   MCV 89.3 03/06/2021 0941   MCV 88 07/12/2015 0910   MCV 91 01/14/2014 1357   NEUTROABS 4.3 03/06/2021 0941   NEUTROABS 3.7 07/12/2015 0910   NEUTROABS 4.3 01/14/2014 1357   LYMPHSABS 1.3 03/06/2021 0941   LYMPHSABS 1.2 07/12/2015 0910   LYMPHSABS 0.7 (L) 01/14/2014 1357   MONOABS 0.7 03/06/2021  0941   MONOABS 0.5 01/14/2014 1357   EOSABS 0.3 03/06/2021 0941   EOSABS 0.2 07/12/2015 0910   EOSABS 0.2 01/14/2014 1357   BASOSABS 0.1 03/06/2021 0941   BASOSABS 0.0 07/12/2015 0910   BASOSABS 0.0 01/14/2014 1357   Comprehensive Metabolic Panel:    Component Value Date/Time   NA 137 03/06/2021 0941   NA 143 07/12/2015 0910   NA 137 04/17/2012 0543   K 3.7 03/06/2021 0941   K 3.6 04/17/2012 0543   CL 100 03/06/2021 0941   CL 101 04/17/2012 0543   CO2 27 03/06/2021 0941   CO2 26 04/17/2012 0543   BUN 22 03/06/2021 0941   BUN 16 07/12/2015 0910   BUN 25 (H) 04/17/2012 0543   CREATININE 1.20 03/06/2021 0941   CREATININE 1.17 01/04/2021 0808   GLUCOSE 116 (H) 03/06/2021  0941   GLUCOSE 148 (H) 04/17/2012 0543   CALCIUM 9.0 03/06/2021 0941   CALCIUM 8.6 04/17/2012 0543   AST 21 03/06/2021 0941   AST 21 04/17/2012 0543   ALT 17 03/06/2021 0941   ALT 24 04/17/2012 0543   ALKPHOS 60 03/06/2021 0941   ALKPHOS 84 04/17/2012 0543   BILITOT 0.5 03/06/2021 0941   BILITOT 0.6 07/12/2015 0910   BILITOT 0.7 04/17/2012 0543   PROT 7.1 03/06/2021 0941   PROT 6.9 07/12/2015 0910   PROT 7.6 04/17/2012 0543   ALBUMIN 3.9 03/06/2021 0941   ALBUMIN 4.4 07/12/2015 0910   ALBUMIN 2.9 (L) 04/17/2012 0543     Present during today's visit: patient only  Assessment and Plan: Reviewed CBC/CMP from today, and PSA from 01/03/21 with patient during visit, labs continue to be wnl and PSA decreased with treatment. BP today well controlled.    Continue apalutamide 240mg  daily, due to leuprolide injection at next office visit (every 6 months, last given 10/25/20)   Oral Chemotherapy Side Effect/Intolerance:  Fatigue: slight fatigue occasionally, but patient reports it is manageable  No reported HA/dizziness, edema, rash, diarrhea, or nausea  Oral Chemotherapy Adherence: no missed doses No patient barriers to medication adherence identified.   New medications: none reported  Medication Access Issues: JJPAF assistance end at the end of this month, in the new year he will be switched to filling his medication at [facility].  Patient expressed understanding and was in agreement with this plan. He also understands that He can call clinic at any time with any questions, concerns, or complaints.   Follow-up plan: RCT on 04/28/20  Thank you for allowing me to participate in the care of this very pleasant patient.   Time Total: 15 mins  Visit consisted of counseling and education on dealing with issues of symptom management in the setting of serious and potentially life-threatening illness.Greater than 50%  of this time was spent counseling and coordinating care  related to the above assessment and plan.  Signed by: Darl Pikes, PharmD, BCPS, Salley Slaughter, CPP Hematology/Oncology Clinical Pharmacist Practitioner [facility]/HP/AP [facility] [phone]  03/06/2021 10:49 AM

## 2021-03-09 ENCOUNTER — Other Ambulatory Visit (HOSPITAL_COMMUNITY): Payer: Self-pay

## 2021-03-28 ENCOUNTER — Ambulatory Visit: Payer: PPO | Admitting: Pharmacist

## 2021-03-28 ENCOUNTER — Ambulatory Visit: Payer: PPO | Admitting: Oncology

## 2021-03-28 ENCOUNTER — Other Ambulatory Visit: Payer: PPO

## 2021-04-04 ENCOUNTER — Other Ambulatory Visit: Payer: Self-pay

## 2021-04-04 ENCOUNTER — Ambulatory Visit
Admission: RE | Admit: 2021-04-04 | Discharge: 2021-04-04 | Disposition: A | Discharge status: Home / Self Care | Payer: PPO | Source: Ambulatory Visit | Attending: Oncology | Admitting: Oncology

## 2021-04-04 DIAGNOSIS — C61 Malignant neoplasm of prostate: Secondary | ICD-10-CM | POA: Insufficient documentation

## 2021-04-04 DIAGNOSIS — Z13820 Encounter for screening for osteoporosis: Secondary | ICD-10-CM | POA: Diagnosis not present

## 2021-04-04 DIAGNOSIS — Z79899 Other long term (current) drug therapy: Secondary | ICD-10-CM | POA: Diagnosis not present

## 2021-04-04 DIAGNOSIS — M069 Rheumatoid arthritis, unspecified: Secondary | ICD-10-CM | POA: Insufficient documentation

## 2021-04-04 DIAGNOSIS — Z79818 Long term (current) use of other agents affecting estrogen receptors and estrogen levels: Secondary | ICD-10-CM | POA: Diagnosis not present

## 2021-04-06 DIAGNOSIS — G4733 Obstructive sleep apnea (adult) (pediatric): Secondary | ICD-10-CM | POA: Diagnosis not present

## 2021-04-21 NOTE — Progress Notes (Signed)
Bone density is required for surveillance for patients who are on ADT given the risk of worsening bone health associated with ADT.  Dr. Randa Evens, MD, MPH [place] at [facility] [phone] 04/21/2021 10:30 AM

## 2021-04-27 ENCOUNTER — Other Ambulatory Visit: Payer: Self-pay | Admitting: Emergency Medicine

## 2021-04-27 DIAGNOSIS — C61 Malignant neoplasm of prostate: Secondary | ICD-10-CM

## 2021-04-28 ENCOUNTER — Other Ambulatory Visit: Payer: Self-pay

## 2021-04-28 ENCOUNTER — Inpatient Hospital Stay (HOSPITAL_BASED_OUTPATIENT_CLINIC_OR_DEPARTMENT_OTHER): Payer: PPO | Admitting: Nurse Practitioner

## 2021-04-28 ENCOUNTER — Ambulatory Visit: Payer: PPO | Admitting: Oncology

## 2021-04-28 ENCOUNTER — Inpatient Hospital Stay: Payer: PPO | Attending: Oncology

## 2021-04-28 ENCOUNTER — Ambulatory Visit: Payer: PPO

## 2021-04-28 ENCOUNTER — Other Ambulatory Visit: Payer: PPO

## 2021-04-28 ENCOUNTER — Inpatient Hospital Stay: Payer: PPO

## 2021-04-28 ENCOUNTER — Encounter: Payer: Self-pay | Admitting: Nurse Practitioner

## 2021-04-28 VITALS — BP 139/83 | HR 71 | Temp 98.1°F | Resp 16 | Wt 242.2 lb

## 2021-04-28 DIAGNOSIS — Z5181 Encounter for therapeutic drug level monitoring: Secondary | ICD-10-CM | POA: Diagnosis not present

## 2021-04-28 DIAGNOSIS — C61 Malignant neoplasm of prostate: Secondary | ICD-10-CM | POA: Diagnosis not present

## 2021-04-28 DIAGNOSIS — Z5111 Encounter for antineoplastic chemotherapy: Secondary | ICD-10-CM | POA: Diagnosis not present

## 2021-04-28 DIAGNOSIS — Z79899 Other long term (current) drug therapy: Secondary | ICD-10-CM | POA: Insufficient documentation

## 2021-04-28 DIAGNOSIS — Z79818 Long term (current) use of other agents affecting estrogen receptors and estrogen levels: Secondary | ICD-10-CM

## 2021-04-28 LAB — COMPREHENSIVE METABOLIC PANEL
ALT: 13 U/L (ref 0–44)
AST: 19 U/L (ref 15–41)
Albumin: 4.1 g/dL (ref 3.5–5.0)
Alkaline Phosphatase: 65 U/L (ref 38–126)
Anion gap: 9 (ref 5–15)
BUN: 23 mg/dL (ref 8–23)
CO2: 26 mmol/L (ref 22–32)
Calcium: 9.2 mg/dL (ref 8.9–10.3)
Chloride: 101 mmol/L (ref 98–111)
Creatinine, Ser: 1.12 mg/dL (ref 0.61–1.24)
GFR, Estimated: >60 mL/min (ref >60)
Glucose, Bld: 140 mg/dL — ABNORMAL HIGH (ref 70–99)
Potassium: 3.7 mmol/L (ref 3.5–5.1)
Sodium: 136 mmol/L (ref 135–145)
Total Bilirubin: 0.1 mg/dL — ABNORMAL LOW (ref 0.3–1.2)
Total Protein: 7.2 g/dL (ref 6.5–8.1)

## 2021-04-28 LAB — PSA: Prostatic Specific Antigen: <0.01 ng/mL (ref 0.00–4.00)

## 2021-04-28 LAB — CBC WITH DIFFERENTIAL/PLATELET
Abs Immature Granulocytes: 0.01 10*3/uL (ref 0.00–0.07)
Basophils Absolute: 0.1 10*3/uL (ref 0.0–0.1)
Basophils Relative: 1 %
Eosinophils Absolute: 0.2 10*3/uL (ref 0.0–0.5)
Eosinophils Relative: 4 %
HCT: 39.2 % (ref 39.0–52.0)
Hemoglobin: 13.2 g/dL (ref 13.0–17.0)
Immature Granulocytes: 0 %
Lymphocytes Relative: 22 %
Lymphs Abs: 1.4 10*3/uL (ref 0.7–4.0)
MCH: 29.7 pg (ref 26.0–34.0)
MCHC: 33.7 g/dL (ref 30.0–36.0)
MCV: 88.1 fL (ref 80.0–100.0)
Monocytes Absolute: 0.6 10*3/uL (ref 0.1–1.0)
Monocytes Relative: 9 %
Neutro Abs: 3.9 10*3/uL (ref 1.7–7.7)
Neutrophils Relative %: 64 %
Platelets: 230 10*3/uL (ref 150–400)
RBC: 4.45 MIL/uL (ref 4.22–5.81)
RDW: 12.6 % (ref 11.5–15.5)
WBC: 6.2 10*3/uL (ref 4.0–10.5)
nRBC: 0 % (ref 0.0–0.2)

## 2021-04-28 MED ORDER — LEUPROLIDE ACETATE (6 MONTH) 45 MG ~~LOC~~ KIT
45.0000 mg | PACK | Freq: Once | SUBCUTANEOUS | Status: AC
  Administered 2021-04-28: 45 mg via SUBCUTANEOUS
  Filled 2021-04-28: qty 45

## 2021-04-28 MED ORDER — APALUTAMIDE 60 MG PO TABS: 240.0000 mg | ORAL_TABLET | Freq: Every day | ORAL | 5 refills | Status: DC

## 2021-04-28 NOTE — Progress Notes (Signed)
Hematology/Oncology Consult Note [facility]  [phone] [phone]  Patient Care Team: Olin Hauser, DO as PCP - General (Family Medicine) Luan Pulling Ronelle Nigh., MD (Inactive) (Unknown Physician Specialty) Grace Isaac, MD (Inactive) as Consulting Physician (Cardiothoracic Surgery) Clance, Armando Reichert, MD as Consulting Physician (Pulmonary Disease) Grace Isaac, MD (Inactive) as Consulting Physician (Cardiothoracic Surgery) Clance, Armando Reichert, MD as Consulting Physician (Pulmonary Disease) Minor, Dalbert Garnet, RN (Inactive) as Case Manager Sindy Guadeloupe, MD as Consulting Physician (Hematology and Oncology)   Name of the patient: Eric Stone  [id number]  1945/04/22   Date of visit: 04/28/21  Diagnosis-castrate resistant nonmetastatic prostate cancer  Chief complaint/ Reason for visit-routine follow-up of prostate cancer on apalutamide  Heme/Onc history: : patient is a 76 year old male who was diagnosed with Gleason 6 adenocarcinoma of the prostateAbout 5 years ago and received radiation treatment.  He has not undergone the surgery for his prostate but has seen Dr. Bernardo Heater in the past.  He was receiving intermittent ADT and last received 45 mg of Eligard on 09/30/2018.  His PSA following that went down to 0.87 December 2020.  He did not receive any ADT after that and his most recent PSA on 09/24/2019 was elevated at 5.08.  Patient reports having significant hot flashes and fatigue when he gets ADT.    Patient presently on continuous ADT since July 2021 which she gets every 6 months.  Bone scan did not show any evidence of metastatic disease.  Patient noted to have PSA doubling time of about 3 months in August 2022.  CT abdomen and bone scan did not show any evidence of distant metastatic disease.Patient started on apalutamide for castrate resistant nonmetastatic prostate cancer    Interval history-patient does complain of ongoing  fatigue as well as self-limited hot flashes which have been ongoing even before he started apalutamide.  Does report some change in his taste sensation. He questions how long he'll be taking medication.   ECOG PS- 1 Pain scale- 0   Review of systems- Review of Systems  Constitutional:  Positive for malaise/fatigue. Negative for chills, fever and weight loss.  HENT:  Negative for congestion, ear discharge and nosebleeds.   Eyes:  Negative for blurred vision.  Respiratory:  Negative for cough, hemoptysis, sputum production, shortness of breath and wheezing.   Cardiovascular:  Negative for chest pain, palpitations, orthopnea and claudication.  Gastrointestinal:  Negative for abdominal pain, blood in stool, constipation, diarrhea, heartburn, melena, nausea and vomiting.  Genitourinary:  Negative for dysuria, flank pain, frequency, hematuria and urgency.  Musculoskeletal:  Negative for back pain, joint pain and myalgias.  Skin:  Negative for rash.  Neurological:  Negative for dizziness, tingling, focal weakness, seizures, weakness and headaches.  Endo/Heme/Allergies:  Does not bruise/bleed easily.       Hot flashes  Psychiatric/Behavioral:  Negative for depression and suicidal ideas. The patient does not have insomnia.      Allergies  Allergen Reactions   Penicillins Other (See Comments)    As a child     Past Medical History:  Diagnosis Date   Arthritis    H/O blood clots    H/O blood clots    in left leg in 1988   Hx of dysplastic nevus 06/18/2013   L lat ankle - mild   Melanoma ([place]) 02/21/2016   L prox med plantar great toe - Breslow's 1.57mm, Clark level early 4   Prostate cancer ([facility])    Rash  occasionally and pt does see a dermatologist   Sleep apnea    uses a CPAP;sleep study >78yrs ago     Past Surgical History:  Procedure Laterality Date   TOE SURGERY     melanoma   TOTAL KNEE ARTHROPLASTY  2009-2010   bilateral   VIDEO ASSISTED THORACOSCOPY  (VATS)/DECORTICATION Left 05/06/2012   Procedure: VIDEO ASSISTED THORACOSCOPY (VATS) drainage loculated pleural effusion;  Surgeon: Grace Isaac, MD;  Location: [facility];  Service: Thoracic;  Laterality: Left;   VIDEO BRONCHOSCOPY N/A 05/06/2012   Procedure: VIDEO BRONCHOSCOPY;  Surgeon: Grace Isaac, MD;  Location: [facility] OR;  Service: Thoracic;  Laterality: N/A;    Social History   Socioeconomic History   Marital status: Married    Spouse name: Not on file   Number of children: Not on file   Years of education: tech school   Highest education level: High school graduate  Occupational History   Occupation: retired  Tobacco Use   Smoking status: Former    Packs/day: 1.00    Years: 26.00    Pack years: 26.00    Types: Cigarettes    Quit date: 03/26/1989    Years since quitting: 32.1   Smokeless tobacco: Former   Tobacco comments:    less than 1 PPD  Vaping Use   Vaping Use: Never used  Substance and Sexual Activity   Alcohol use: No    Alcohol/week: 0.0 standard drinks   Drug use: No   Sexual activity: Yes  Other Topics Concern   Not on file  Social History Narrative   Not on file   Social Determinants of Health   Financial Resource Strain: Not on file  Food Insecurity: Not on file  Transportation Needs: Not on file  Physical Activity: Not on file  Stress: Not on file  Social Connections: Not on file  Intimate Partner Violence: Not on file    Family History  Problem Relation Age of Onset   Hypertension Mother    Stroke Mother        possible   Cancer Brother      Current Outpatient Medications:    apalutamide (ERLEADA) 60 MG tablet, Take 4 tablets (240 mg total) by mouth daily., Disp: 120 tablet, Rfl: 5   aspirin EC 81 MG tablet, Take 81 mg by mouth., Disp: , Rfl:    hydrochlorothiazide (HYDRODIURIL) 12.5 MG tablet, Take 1 tablet (12.5 mg total) by mouth daily., Disp: 90 tablet, Rfl: 3   lisinopril (ZESTRIL) 30 MG tablet, Take 1 tablet (30 mg total) by  mouth daily., Disp: 90 tablet, Rfl: 3   pimecrolimus (ELIDEL) 1 % cream, APPLY TO AFFECTED AREA GROIN TWICE DAILY AS DIRECTED, Disp: 60 g, Rfl: 5   rosuvastatin (CRESTOR) 10 MG tablet, TAKE 1 TABLET BY MOUTH AT BEDTIME, Disp: 90 tablet, Rfl: 0  Physical exam:  Vitals:   04/28/21 1335  BP: 139/83  Pulse: 71  Resp: 16  Temp: 98.1 F (36.7 C)  SpO2: 98%  Weight: 242 lb 3.2 oz (109.9 kg)   Physical Exam Vitals reviewed.  Constitutional:      Appearance: He is not ill-appearing.  Pulmonary:     Effort: No respiratory distress.  Skin:    Coloration: Skin is not pale.  Neurological:     Mental Status: He is alert and oriented to person, place, and time.  Psychiatric:        Mood and Affect: Mood normal.  Behavior: Behavior normal.     CMP Latest Ref Rng & Units 04/28/2021  Glucose 70 - 99 mg/dL 140(H)  BUN 8 - 23 mg/dL 23  Creatinine 0.61 - 1.24 mg/dL 1.12  Sodium 135 - 145 mmol/L 136  Potassium 3.5 - 5.1 mmol/L 3.7  Chloride 98 - 111 mmol/L 101  CO2 22 - 32 mmol/L 26  Calcium 8.9 - 10.3 mg/dL 9.2  Total Protein 6.5 - 8.1 g/dL 7.2  Total Bilirubin 0.3 - 1.2 mg/dL 0.1(L)  Alkaline Phos 38 - 126 U/L 65  AST 15 - 41 U/L 19  ALT 0 - 44 U/L 13   CBC Latest Ref Rng & Units 04/28/2021  WBC 4.0 - 10.5 K/uL 6.2  Hemoglobin 13.0 - 17.0 g/dL 13.2  Hematocrit 39.0 - 52.0 % 39.2  Platelets 150 - 400 K/uL 230   Component Ref Range & Units 4 d ago 1 mo ago 3 mo ago 4 mo ago 6 mo ago 7 mo ago 9 mo ago  Prostatic Specific Antigen 0.00 - 4.00 ng/mL <0.01  <0.01 CM  <0.01 CM  0.06 CM  4.68 High  CM  1.98 CM  1.51 CM        Assessment and plan- Patient is a 76 y.o. male with castrate resistant nonmetastatic prostate cancer currently on Eligard and apalutamide here for routine follow-up  After starting apalutamide patient's PSA which had gone up to 4.6. Decreased to 0.06 and now < 0.01. Levels today awere pending at time of visit but PSA remains undetectable.   Discussed with the  patient the natural history of prostate cancer and difference between castrate sensitive and castrate resistant disease.  Ideally patient needs to stay on apalutamide until progression or toxicity.  Again discussed risks and benefits of apalutamide including all but not limited to fatigue, hot flashes, worsening bone health gynecomastia anemia and loss of muscle mass. Fatigue likely related to low testosterone levels (today is 7) Patient agrees to continue apalutamide at this time. Proceed with leuprolide/eliqgard today. He would be due for his next Eligard sometime in August 2023.   Bone density- T score 1.0 which is normal. Consider repeating in 1-2 years (around January 2025).  Eligard today 2 months- lab & Janese Banks   Thank you for allowing me to participate in the care of this patient.   Visit Diagnosis 1. Malignant neoplasm of prostate ([place])    Beckey Rutter, [place], AGNP-C [place] at [facility] [phone] (clinic) 04/28/2021

## 2021-04-29 LAB — TESTOSTERONE: Testosterone: 7 ng/dL — ABNORMAL LOW (ref 264–916)

## 2021-05-02 ENCOUNTER — Encounter: Payer: Self-pay | Admitting: Oncology

## 2021-05-03 ENCOUNTER — Other Ambulatory Visit: Payer: PPO

## 2021-05-03 ENCOUNTER — Ambulatory Visit: Payer: PPO | Admitting: Oncology

## 2021-05-10 ENCOUNTER — Other Ambulatory Visit: Payer: Self-pay | Admitting: Family Medicine

## 2021-05-10 DIAGNOSIS — E7800 Pure hypercholesterolemia, unspecified: Secondary | ICD-10-CM

## 2021-05-10 NOTE — Telephone Encounter (Signed)
Requested medication (s) are due for refill today - yes  Requested medication (s) are on the active medication list -yes  Future visit scheduled -yes  Last refill: 02/09/21 #90  Notes to clinic: Request RF: fails lab protocol  Requested Prescriptions  Pending Prescriptions Disp Refills   rosuvastatin (CRESTOR) 10 MG tablet [Pharmacy Med Name: Rosuvastatin Calcium 10 MG Oral Tablet] 90 tablet 0    Sig: TAKE 1 TABLET BY MOUTH AT BEDTIME     Cardiovascular:  Antilipid - Statins 2 Failed - 05/10/2021  2:29 AM      Failed - Lipid Panel in normal range within the last 12 months    Cholesterol, Total  Date Value Ref Range Status  07/12/2015 156 100 - 199 mg/dL Final   Cholesterol  Date Value Ref Range Status  01/05/2020 120 <200 mg/dL Final   LDL Cholesterol (Calc)  Date Value Ref Range Status  01/05/2020 54 mg/dL (calc) Final    Comment:    Reference range: <100 . Desirable range <100 mg/dL for primary prevention;   <70 mg/dL for patients with CHD or diabetic patients  with > or = 2 CHD risk factors. Marland Kitchen LDL-C is now calculated using the Martin-Hopkins  calculation, which is a validated novel method providing  better accuracy than the Friedewald equation in the  estimation of LDL-C.  Cresenciano Genre et al. Annamaria Helling. 0539;767(34): 2061-2068  (http://education.[facility].com/faq/FAQ164)    HDL  Date Value Ref Range Status  01/05/2020 46 > OR = 40 mg/dL Final  07/12/2015 40 >39 mg/dL Final   Triglycerides  Date Value Ref Range Status  01/05/2020 116 <150 mg/dL Final         Passed - Cr in normal range and within 360 days    Creat  Date Value Ref Range Status  01/04/2021 1.17 0.70 - 1.28 mg/dL Final   Creatinine, Ser  Date Value Ref Range Status  04/28/2021 1.12 0.61 - 1.24 mg/dL Final          Passed - Patient is not pregnant      Passed - Valid encounter within last 12 months    Recent Outpatient Visits           3 months ago Annual physical exam   [facility] Olin Hauser, DO   10 months ago Type 2 diabetes mellitus with other specified complication, without long-term current use of insulin ([place])   [facility], Devonne Doughty, DO   11 months ago Chronic left-sided low back pain without sciatica   [place], DO   1 year ago Annual physical exam   [facility] Olin Hauser, DO   1 year ago Type 2 diabetes mellitus with other specified complication, without long-term current use of insulin ([place])   [place], DO       Future Appointments             In 2 months [place], Devonne Doughty, DO [facility], [facility]               Requested Prescriptions  Pending Prescriptions Disp Refills   rosuvastatin (CRESTOR) 10 MG tablet [Pharmacy Med Name: Rosuvastatin Calcium 10 MG Oral Tablet] 90 tablet 0    Sig: TAKE 1 TABLET BY MOUTH AT BEDTIME     Cardiovascular:  Antilipid - Statins 2 Failed - 05/10/2021  2:29 AM      Failed -  Lipid Panel in normal range within the last 12 months    Cholesterol, Total  Date Value Ref Range Status  07/12/2015 156 100 - 199 mg/dL Final   Cholesterol  Date Value Ref Range Status  01/05/2020 120 <200 mg/dL Final   LDL Cholesterol (Calc)  Date Value Ref Range Status  01/05/2020 54 mg/dL (calc) Final    Comment:    Reference range: <100 . Desirable range <100 mg/dL for primary prevention;   <70 mg/dL for patients with CHD or diabetic patients  with > or = 2 CHD risk factors. Marland Kitchen LDL-C is now calculated using the Martin-Hopkins  calculation, which is a validated novel method providing  better accuracy than the Friedewald equation in the  estimation of LDL-C.  Cresenciano Genre et al. Annamaria Helling. 0321;224(82): 2061-2068  (http://education.[facility].com/faq/FAQ164)    HDL  Date Value Ref Range Status  01/05/2020 46 > OR = 40 mg/dL  Final  07/12/2015 40 >39 mg/dL Final   Triglycerides  Date Value Ref Range Status  01/05/2020 116 <150 mg/dL Final         Passed - Cr in normal range and within 360 days    Creat  Date Value Ref Range Status  01/04/2021 1.17 0.70 - 1.28 mg/dL Final   Creatinine, Ser  Date Value Ref Range Status  04/28/2021 1.12 0.61 - 1.24 mg/dL Final          Passed - Patient is not pregnant      Passed - Valid encounter within last 12 months    Recent Outpatient Visits           3 months ago Annual physical exam   [facility] Olin Hauser, DO   10 months ago Type 2 diabetes mellitus with other specified complication, without long-term current use of insulin [facility])   [facility], Devonne Doughty, DO   11 months ago Chronic left-sided low back pain without sciatica   [place], DO   1 year ago Annual physical exam   [facility] Olin Hauser, DO   1 year ago Type 2 diabetes mellitus with other specified complication, without long-term current use of insulin ([place])   [facility] [place] Ranger, Devonne Doughty, DO       Future Appointments             In 2 months Parks Ranger, Devonne Doughty, DO [facility], [facility]

## 2021-05-16 ENCOUNTER — Encounter: Payer: Self-pay | Admitting: Oncology

## 2021-05-16 ENCOUNTER — Encounter: Payer: Self-pay | Admitting: Family Medicine

## 2021-05-16 ENCOUNTER — Other Ambulatory Visit (HOSPITAL_COMMUNITY): Payer: Self-pay

## 2021-05-16 ENCOUNTER — Other Ambulatory Visit: Payer: Self-pay | Admitting: Pharmacist

## 2021-05-16 DIAGNOSIS — C61 Malignant neoplasm of prostate: Secondary | ICD-10-CM

## 2021-05-16 DIAGNOSIS — E1169 Type 2 diabetes mellitus with other specified complication: Secondary | ICD-10-CM

## 2021-05-16 MED ORDER — APALUTAMIDE 60 MG PO TABS
240.0000 mg | ORAL_TABLET | Freq: Every day | ORAL | 5 refills | Status: DC
  Filled 2021-05-16 (×2): qty 120, 30d supply, fill #0
  Filled 2021-06-06: qty 120, 30d supply, fill #1
  Filled 2021-07-06: qty 120, 30d supply, fill #2
  Filled 2021-07-31: qty 120, 30d supply, fill #3
  Filled 2021-09-12: qty 120, 30d supply, fill #4
  Filled 2021-10-09: qty 120, 30d supply, fill #5

## 2021-05-17 MED ORDER — ATORVASTATIN CALCIUM 10 MG PO TABS: 10.0000 mg | ORAL_TABLET | Freq: Every day | ORAL | 0 refills | Status: DC

## 2021-05-18 ENCOUNTER — Other Ambulatory Visit (HOSPITAL_COMMUNITY): Payer: Self-pay

## 2021-05-19 ENCOUNTER — Telehealth: Payer: Self-pay | Admitting: Family Medicine

## 2021-05-19 NOTE — Telephone Encounter (Signed)
Left message for patient to call back and schedule the Medicare Annual Wellness Visit (AWV) virtually or by telephone.  Last AWV 01/13/19  Please schedule at anytime with [place].  40 minute appointment  Any questions, please call me at [phone]

## 2021-05-19 NOTE — Telephone Encounter (Signed)
Pt returned call about AWV/ pt stated he wasn't interested in scheduling this appt and has spoken to Dr. Parks Ranger about this before / please advise

## 2021-06-06 ENCOUNTER — Other Ambulatory Visit (HOSPITAL_COMMUNITY): Payer: Self-pay

## 2021-06-12 ENCOUNTER — Other Ambulatory Visit (HOSPITAL_COMMUNITY): Payer: Self-pay

## 2021-06-21 ENCOUNTER — Other Ambulatory Visit: Payer: Self-pay | Admitting: *Deleted

## 2021-06-21 DIAGNOSIS — C61 Malignant neoplasm of prostate: Secondary | ICD-10-CM

## 2021-06-27 ENCOUNTER — Inpatient Hospital Stay: Payer: PPO | Admitting: Oncology

## 2021-06-27 ENCOUNTER — Encounter: Payer: Self-pay | Admitting: Oncology

## 2021-06-27 ENCOUNTER — Inpatient Hospital Stay: Payer: PPO | Attending: Oncology

## 2021-06-27 VITALS — BP 119/68 | HR 75 | Temp 97.0°F | Resp 16 | Wt 241.2 lb

## 2021-06-27 DIAGNOSIS — Z87891 Personal history of nicotine dependence: Secondary | ICD-10-CM | POA: Diagnosis not present

## 2021-06-27 DIAGNOSIS — Z79818 Long term (current) use of other agents affecting estrogen receptors and estrogen levels: Secondary | ICD-10-CM | POA: Diagnosis not present

## 2021-06-27 DIAGNOSIS — Z5181 Encounter for therapeutic drug level monitoring: Secondary | ICD-10-CM

## 2021-06-27 DIAGNOSIS — Z79899 Other long term (current) drug therapy: Secondary | ICD-10-CM | POA: Diagnosis not present

## 2021-06-27 DIAGNOSIS — C61 Malignant neoplasm of prostate: Secondary | ICD-10-CM | POA: Insufficient documentation

## 2021-06-27 LAB — CBC WITH DIFFERENTIAL/PLATELET
Abs Immature Granulocytes: 0.02 10*3/uL (ref 0.00–0.07)
Basophils Absolute: 0.1 10*3/uL (ref 0.0–0.1)
Basophils Relative: 1 %
Eosinophils Absolute: 0.3 10*3/uL (ref 0.0–0.5)
Eosinophils Relative: 4 %
HCT: 40.2 % (ref 39.0–52.0)
Hemoglobin: 13.3 g/dL (ref 13.0–17.0)
Immature Granulocytes: 0 %
Lymphocytes Relative: 16 %
Lymphs Abs: 1.2 10*3/uL (ref 0.7–4.0)
MCH: 29.2 pg (ref 26.0–34.0)
MCHC: 33.1 g/dL (ref 30.0–36.0)
MCV: 88.4 fL (ref 80.0–100.0)
Monocytes Absolute: 0.6 10*3/uL (ref 0.1–1.0)
Monocytes Relative: 8 %
Neutro Abs: 5.4 10*3/uL (ref 1.7–7.7)
Neutrophils Relative %: 71 %
Platelets: 220 10*3/uL (ref 150–400)
RBC: 4.55 MIL/uL (ref 4.22–5.81)
RDW: 12.8 % (ref 11.5–15.5)
WBC: 7.5 10*3/uL (ref 4.0–10.5)
nRBC: 0 % (ref 0.0–0.2)

## 2021-06-27 LAB — COMPREHENSIVE METABOLIC PANEL
ALT: 12 U/L (ref 0–44)
AST: 21 U/L (ref 15–41)
Albumin: 4 g/dL (ref 3.5–5.0)
Alkaline Phosphatase: 61 U/L (ref 38–126)
Anion gap: 9 (ref 5–15)
BUN: 19 mg/dL (ref 8–23)
CO2: 25 mmol/L (ref 22–32)
Calcium: 9.1 mg/dL (ref 8.9–10.3)
Chloride: 103 mmol/L (ref 98–111)
Creatinine, Ser: 1.11 mg/dL (ref 0.61–1.24)
GFR, Estimated: >60 mL/min (ref >60)
Glucose, Bld: 153 mg/dL — ABNORMAL HIGH (ref 70–99)
Potassium: 3.9 mmol/L (ref 3.5–5.1)
Sodium: 137 mmol/L (ref 135–145)
Total Bilirubin: 0.5 mg/dL (ref 0.3–1.2)
Total Protein: 7 g/dL (ref 6.5–8.1)

## 2021-06-27 LAB — PSA: Prostatic Specific Antigen: <0.01 ng/mL (ref 0.00–4.00)

## 2021-06-27 NOTE — Progress Notes (Signed)
? ? ? ?Hematology/Oncology Consult note ?[place]  ?[phone] [phone] ? ?Patient Care Team: ?Olin Hauser, DO as PCP - General (Family Medicine) ?Arlis Porta., MD (Inactive) (Unknown Physician Specialty) ?Grace Isaac, MD (Inactive) as Consulting Physician (Cardiothoracic Surgery) ?Kathee Delton, MD as Consulting Physician (Pulmonary Disease) ?Grace Isaac, MD (Inactive) as Consulting Physician (Cardiothoracic Surgery) ?Kathee Delton, MD as Consulting Physician (Pulmonary Disease) ?Minor, Dalbert Garnet, RN (Inactive) as Case Manager ?Sindy Guadeloupe, MD as Consulting Physician (Hematology and Oncology)  ? ?Name of the patient: Eric Stone  ?[id number]  ?[birth year]  ? ?Date of visit: 06/27/21 ? ?Diagnosis-castrate resistant nonmetastatic prostate cancer ? ?Chief complaint/ Reason for visit-routine follow-up of prostate cancer and apalutamide ? ?Heme/Onc history:  patient is a 77 year old male who was diagnosed with Gleason 6 adenocarcinoma of the prostateAbout 5 years ago and received radiation treatment.  He has not undergone the surgery for his prostate but has seen Dr. Bernardo Heater in the past.  He was receiving intermittent ADT and last received 45 mg of Eligard on 09/30/2018.  His PSA following that went down to 0.87 December 2020.  He did not receive any ADT after that and his most recent PSA on 09/24/2019 was elevated at 5.08.  Patient reports having significant hot flashes and fatigue when he gets ADT.  ?  ?Patient presently on continuous ADT since July 2021 which she gets every 6 months.  Bone scan did not show any evidence of metastatic disease. ?  ?Patient noted to have PSA doubling time of about 3 months in August 2022.  CT abdomen and bone scan did not show any evidence of distant metastatic disease.Patient started on apalutamide for castrate resistant nonmetastatic prostate cancer ?  ? ?Interval history-alternating Lupron as well as  apalutamide well mild fatigue and self-limited hot flashes he denies other complaints at this time ? ?ECOG PS- 1 ?Pain scale- 0 ? ? ?Review of systems- Review of Systems  ?Constitutional:  Positive for malaise/fatigue. Negative for chills, fever and weight loss.  ?HENT:  Negative for congestion, ear discharge and nosebleeds.   ?Eyes:  Negative for blurred vision.  ?Respiratory:  Negative for cough, hemoptysis, sputum production, shortness of breath and wheezing.   ?Cardiovascular:  Negative for chest pain, palpitations, orthopnea and claudication.  ?Gastrointestinal:  Negative for abdominal pain, blood in stool, constipation, diarrhea, heartburn, melena, nausea and vomiting.  ?Genitourinary:  Negative for dysuria, flank pain, frequency, hematuria and urgency.  ?Musculoskeletal:  Negative for back pain, joint pain and myalgias.  ?Skin:  Negative for rash.  ?Neurological:  Negative for dizziness, tingling, focal weakness, seizures, weakness and headaches.  ?Endo/Heme/Allergies:  Does not bruise/bleed easily.  ?Psychiatric/Behavioral:  Negative for depression and suicidal ideas. The patient does not have insomnia.    ? ? ? ?Allergies  ?Allergen Reactions  ? Penicillins Other (See Comments)  ?  As a child  ? ? ? ?Past Medical History:  ?Diagnosis Date  ? Arthritis   ? H/O blood clots   ? H/O blood clots   ? in left leg in 1988  ? Hx of dysplastic nevus 06/18/2013  ? L lat ankle - mild  ? Melanoma ([place]) 02/21/2016  ? L prox med plantar great toe - Breslow's 1.70m, Clark level early 4  ? Prostate cancer ([place]   ? Rash   ? occasionally and pt does see a dermatologist  ? Sleep apnea   ? uses a CPAP;sleep study >523yrago  ? ? ? ?  Past Surgical History:  ?Procedure Laterality Date  ? TOE SURGERY    ? melanoma  ? TOTAL KNEE ARTHROPLASTY  2009-2010  ? bilateral  ? VIDEO ASSISTED THORACOSCOPY (VATS)/DECORTICATION Left 05/06/2012  ? Procedure: VIDEO ASSISTED THORACOSCOPY (VATS) drainage loculated pleural effusion;  Surgeon: Grace Isaac, MD;  Location: [facility];  Service: Thoracic;  Laterality: Left;  ? VIDEO BRONCHOSCOPY N/A 05/06/2012  ? Procedure: VIDEO BRONCHOSCOPY;  Surgeon: Grace Isaac, MD;  Location: [facility];  Service: Thoracic;  Laterality: N/A;  ? ? ?Social History  ? ?Socioeconomic History  ? Marital status: Married  ?  Spouse name: Not on file  ? Number of children: Not on file  ? Years of education: tech school  ? Highest education level: High school graduate  ?Occupational History  ? Occupation: retired  ?Tobacco Use  ? Smoking status: Former  ?  Packs/day: 1.00  ?  Years: 26.00  ?  Pack years: 26.00  ?  Types: Cigarettes  ?  Quit date: 03/26/1989  ?  Years since quitting: 32.2  ? Smokeless tobacco: Former  ? Tobacco comments:  ?  less than 1 PPD  ?Vaping Use  ? Vaping Use: Never used  ?Substance and Sexual Activity  ? Alcohol use: No  ?  Alcohol/week: 0.0 standard drinks  ? Drug use: No  ? Sexual activity: Yes  ?Other Topics Concern  ? Not on file  ?Social History Narrative  ? Not on file  ? ?Social Determinants of Health  ? ?Financial Resource Strain: Not on file  ?Food Insecurity: Not on file  ?Transportation Needs: Not on file  ?Physical Activity: Not on file  ?Stress: Not on file  ?Social Connections: Not on file  ?Intimate Partner Violence: Not on file  ? ? ?Family History  ?Problem Relation Age of Onset  ? Hypertension Mother   ? Stroke Mother   ?     possible  ? Cancer Brother   ? ? ? ?Current Outpatient Medications:  ?  apalutamide (ERLEADA) 60 MG tablet, Take 4 tablets (240 mg total) by mouth daily., Disp: 120 tablet, Rfl: 5 ?  aspirin EC 81 MG tablet, Take 81 mg by mouth., Disp: , Rfl:  ?  atorvastatin (LIPITOR) 10 MG tablet, Take 1 tablet (10 mg total) by mouth at bedtime., Disp: 90 tablet, Rfl: 0 ?  hydrochlorothiazide (HYDRODIURIL) 12.5 MG tablet, Take 1 tablet (12.5 mg total) by mouth daily., Disp: 90 tablet, Rfl: 3 ?  lisinopril (ZESTRIL) 30 MG tablet, Take 1 tablet (30 mg total) by mouth daily., Disp: 90  tablet, Rfl: 3 ?  pimecrolimus (ELIDEL) 1 % cream, APPLY TO AFFECTED AREA GROIN TWICE DAILY AS DIRECTED, Disp: 60 g, Rfl: 5 ? ?Physical exam:  ?Vitals:  ? 06/27/21 1357  ?BP: 119/68  ?Pulse: 75  ?Resp: 16  ?Temp: (!) 97 ?F (36.1 ?C)  ?SpO2: 97%  ?Weight: 241 lb 3.2 oz (109.4 kg)  ? ?Physical Exam ?Eyes:  ?   Pupils: Pupils are equal, round, and reactive to light.  ?Cardiovascular:  ?   Rate and Rhythm: Normal rate and regular rhythm.  ?   Heart sounds: Normal heart sounds.  ?Pulmonary:  ?   Effort: Pulmonary effort is normal.  ?   Breath sounds: Normal breath sounds.  ?Abdominal:  ?   General: Bowel sounds are normal.  ?   Palpations: Abdomen is soft.  ?Skin: ?   General: Skin is warm and dry.  ?Neurological:  ?  Mental Status: He is alert and oriented to person, place, and time.  ?  ? ? ?  Latest Ref Rng & Units 04/28/2021  ?  1:22 PM  ?CMP  ?Glucose 70 - 99 mg/dL 140    ?BUN 8 - 23 mg/dL 23    ?Creatinine 0.61 - 1.24 mg/dL 1.12    ?Sodium 135 - 145 mmol/L 136    ?Potassium 3.5 - 5.1 mmol/L 3.7    ?Chloride 98 - 111 mmol/L 101    ?CO2 22 - 32 mmol/L 26    ?Calcium 8.9 - 10.3 mg/dL 9.2    ?Total Protein 6.5 - 8.1 g/dL 7.2    ?Total Bilirubin 0.3 - 1.2 mg/dL 0.1    ?Alkaline Phos 38 - 126 U/L 65    ?AST 15 - 41 U/L 19    ?ALT 0 - 44 U/L 13    ? ? ?  Latest Ref Rng & Units 04/28/2021  ?  1:22 PM  ?CBC  ?WBC 4.0 - 10.5 K/uL 6.2    ?Hemoglobin 13.0 - 17.0 g/dL 13.2    ?Hematocrit 39.0 - 52.0 % 39.2    ?Platelets 150 - 400 K/uL 230    ? ? ? ?Assessment and plan- Patient is a 76 y.o. male with history of nonmetastatic castrate resistant prostate cancer currently on ADT plus apalutamide here for a routine follow-up ? ?Patient is tolerating Lupron plus apalutamide well without any significant side effects.  His PSA currently remains undetectable at less than 0.01.  Testosterone levels are also in the castrate range.  Discussed with the patient that ideally and castrate resistant nonmetastatic setting apalutamide is continued  until progression or toxicity.  Patient did have increase in his PSA when he was on ADT alone.  ADT also as such cannot be stopped at this time in the castrate resistant setting.  Patient is agreeable to [facility]

## 2021-06-28 LAB — TESTOSTERONE: Testosterone: 10 ng/dL — ABNORMAL LOW (ref 264–916)

## 2021-06-29 ENCOUNTER — Other Ambulatory Visit (HOSPITAL_COMMUNITY): Payer: Self-pay

## 2021-06-30 ENCOUNTER — Other Ambulatory Visit (HOSPITAL_COMMUNITY): Payer: Self-pay

## 2021-07-02 ENCOUNTER — Encounter: Payer: Self-pay | Admitting: Oncology

## 2021-07-03 ENCOUNTER — Other Ambulatory Visit (HOSPITAL_COMMUNITY): Payer: Self-pay

## 2021-07-04 ENCOUNTER — Other Ambulatory Visit (HOSPITAL_COMMUNITY): Payer: Self-pay

## 2021-07-06 ENCOUNTER — Other Ambulatory Visit (HOSPITAL_COMMUNITY): Payer: Self-pay

## 2021-07-06 DIAGNOSIS — G4733 Obstructive sleep apnea (adult) (pediatric): Secondary | ICD-10-CM | POA: Diagnosis not present

## 2021-07-10 ENCOUNTER — Other Ambulatory Visit (HOSPITAL_COMMUNITY): Payer: Self-pay

## 2021-07-10 ENCOUNTER — Ambulatory Visit: Payer: PPO | Admitting: Dermatology

## 2021-07-10 DIAGNOSIS — L814 Other melanin hyperpigmentation: Secondary | ICD-10-CM

## 2021-07-10 DIAGNOSIS — Z85820 Personal history of malignant melanoma of skin: Secondary | ICD-10-CM

## 2021-07-10 DIAGNOSIS — Z1283 Encounter for screening for malignant neoplasm of skin: Secondary | ICD-10-CM | POA: Diagnosis not present

## 2021-07-10 DIAGNOSIS — Z86018 Personal history of other benign neoplasm: Secondary | ICD-10-CM | POA: Diagnosis not present

## 2021-07-10 DIAGNOSIS — L821 Other seborrheic keratosis: Secondary | ICD-10-CM | POA: Diagnosis not present

## 2021-07-10 DIAGNOSIS — I8393 Asymptomatic varicose veins of bilateral lower extremities: Secondary | ICD-10-CM | POA: Diagnosis not present

## 2021-07-10 DIAGNOSIS — L820 Inflamed seborrheic keratosis: Secondary | ICD-10-CM | POA: Diagnosis not present

## 2021-07-10 DIAGNOSIS — L578 Other skin changes due to chronic exposure to nonionizing radiation: Secondary | ICD-10-CM | POA: Diagnosis not present

## 2021-07-10 DIAGNOSIS — D229 Melanocytic nevi, unspecified: Secondary | ICD-10-CM

## 2021-07-10 DIAGNOSIS — D1800 Hemangioma unspecified site: Secondary | ICD-10-CM

## 2021-07-10 NOTE — Patient Instructions (Signed)

## 2021-07-10 NOTE — Progress Notes (Signed)
? ?Follow-Up Visit ?  ?Subjective  ?Eric Stone. is a 76 y.o. male who presents for the following: Annual Exam (Hx of MM, dysplastic nevi - patient has irregular irritated skin lesions on the face he would like treated today. ). The patient presents for Total-Body Skin Exam (TBSE) for skin cancer screening and mole check.  The patient has spots, moles and lesions to be evaluated, some may be new or changing and the patient has concerns that these could be cancer. ? ?The following portions of the chart were reviewed this encounter and updated as appropriate:  ? Tobacco  Allergies  Meds  Problems  Med Hx  Surg Hx  Fam Hx   ?  ?Review of Systems:  No other skin or systemic complaints except as noted in HPI or Assessment and Plan. ? ?Objective  ?Well appearing patient in no apparent distress; mood and affect are within normal limits. ? ?A full examination was performed including scalp, head, eyes, ears, nose, lips, neck, chest, axillae, abdomen, back, buttocks, bilateral upper extremities, bilateral lower extremities, hands, feet, fingers, toes, fingernails, and toenails. All findings within normal limits unless otherwise noted below. ? ?R nose x 2, Forehead, L temple ?Erythematous stuck-on, waxy papule or plaque ? ? ?Assessment & Plan  ?Inflamed seborrheic keratosis ?R nose x 2, Forehead, L temple ? ?Destruction of lesion - R nose x 2, Forehead, L temple ?Complexity: simple   ?Destruction method: cryotherapy   ?Informed consent: discussed and consent obtained   ?Timeout:  patient name, date of birth, surgical site, and procedure verified ?Lesion destroyed using liquid nitrogen: Yes   ?Region frozen until ice ball extended beyond lesion: Yes   ?Outcome: patient tolerated procedure well with no complications   ?Post-procedure details: wound care instructions given   ? ?Skin cancer screening ? ?Lentigines ?- Scattered tan macules ?- Due to sun exposure ?- Benign-appearing, observe ?- Recommend daily broad  spectrum sunscreen SPF 30+ to sun-exposed areas, reapply every 2 hours as needed. ?- Call for any changes ? ?Seborrheic Keratoses ?- Stuck-on, waxy, tan-brown papules and/or plaques  ?- Benign-appearing ?- Discussed benign etiology and prognosis. ?- Observe ?- Call for any changes ? ?Melanocytic Nevi ?- Tan-brown and/or pink-flesh-colored symmetric macules and papules ?- Benign appearing on exam today ?- Observation ?- Call clinic for new or changing moles ?- Recommend daily use of broad spectrum spf 30+ sunscreen to sun-exposed areas.  ? ?Hemangiomas ?- Red papules ?- Discussed benign nature ?- Observe ?- Call for any changes ? ?Actinic Damage ?- Chronic condition, secondary to cumulative UV/sun exposure ?- diffuse scaly erythematous macules with underlying dyspigmentation ?- Recommend daily broad spectrum sunscreen SPF 30+ to sun-exposed areas, reapply every 2 hours as needed.  ?- Staying in the shade or wearing long sleeves, sun glasses (UVA+UVB protection) and wide brim hats (4-inch brim around the entire circumference of the hat) are also recommended for sun protection.  ?- Call for new or changing lesions. ? ?History of Melanoma ?- No evidence of recurrence today ?- No lymphadenopathy ?- Recommend regular full body skin exams ?- Recommend daily broad spectrum sunscreen SPF 30+ to sun-exposed areas, reapply every 2 hours as needed.  ?- Call if any new or changing lesions are noted between office visits ? ?History of Dysplastic Nevi ?- No evidence of recurrence today ?- Recommend regular full body skin exams ?- Recommend daily broad spectrum sunscreen SPF 30+ to sun-exposed areas, reapply every 2 hours as needed.  ?- Call if any  new or changing lesions are noted between office visits ? ?Varicose Veins/Spider Veins ?- Dilated blue, purple or red veins at the lower extremities ?- Reassured ?- Smaller vessels can be treated by sclerotherapy (a procedure to inject a medicine into the veins to make them disappear) if  desired, but the treatment is not covered by insurance. Larger vessels may be covered if symptomatic and we would refer to vascular surgeon if treatment desired. ? ?Skin cancer screening performed today. ? ?Return in about 1 year (around 07/11/2022) for TBSE. ? ?I, Rudell Cobb, CMA, am acting as scribe for Sarina Ser, MD . ?Documentation: I have reviewed the above documentation for accuracy and completeness, and I agree with the above. ? ?Sarina Ser, MD ? ? ?

## 2021-07-12 ENCOUNTER — Ambulatory Visit: Payer: PPO | Admitting: Family Medicine

## 2021-07-17 ENCOUNTER — Ambulatory Visit (INDEPENDENT_AMBULATORY_CARE_PROVIDER_SITE_OTHER): Payer: PPO | Admitting: Family Medicine

## 2021-07-17 ENCOUNTER — Other Ambulatory Visit: Payer: Self-pay | Admitting: Family Medicine

## 2021-07-17 ENCOUNTER — Encounter: Payer: Self-pay | Admitting: Family Medicine

## 2021-07-17 VITALS — BP 128/62 | HR 66 | Ht 70.0 in | Wt 241.6 lb

## 2021-07-17 DIAGNOSIS — G4733 Obstructive sleep apnea (adult) (pediatric): Secondary | ICD-10-CM | POA: Diagnosis not present

## 2021-07-17 DIAGNOSIS — E1169 Type 2 diabetes mellitus with other specified complication: Secondary | ICD-10-CM

## 2021-07-17 DIAGNOSIS — E785 Hyperlipidemia, unspecified: Secondary | ICD-10-CM

## 2021-07-17 DIAGNOSIS — Z9989 Dependence on other enabling machines and devices: Secondary | ICD-10-CM | POA: Diagnosis not present

## 2021-07-17 DIAGNOSIS — I10 Essential (primary) hypertension: Secondary | ICD-10-CM

## 2021-07-17 DIAGNOSIS — Z0000 Encounter for general adult medical examination without abnormal findings: Secondary | ICD-10-CM

## 2021-07-17 MED ORDER — ATORVASTATIN CALCIUM 10 MG PO TABS: 10.0000 mg | ORAL_TABLET | Freq: Every day | ORAL | 3 refills | Status: DC

## 2021-07-17 NOTE — Assessment & Plan Note (Addendum)
Controlled cholesterol on lifestyle ? ?Plan: ?1. Continue Rosuvastatin '10mg'$  nightly ?2. Continue ASA '81mg'$  for primary ASCVD risk reduction ?3. Encourage improved lifestyle - low carb/cholesterol, reduce portion size, continue improving regular exercise ?

## 2021-07-17 NOTE — Assessment & Plan Note (Signed)
Previously controlled type 2 diabetes, prior at goal < 7-8 ?Skip A1c no POC fingerstick ? ?No hypoglycemia or hyperglycemia ?Complications - hyperlipidemia, OSA - increases risk of future cardiovascular complications  ? ?Plan:  ?- Not on medication for DM, it is diet controlled ?- Encourage improved lifestyle - low carb, low sugar diet, reduce portion size, continue improving regular exercise ?- May continue chronic case management for comprehensive nursing education on diabetes among other benefits ?- Continue ASA, ACEi, Statin ? ?Future DM Eye Dr Terrilee Files ?

## 2021-07-17 NOTE — Patient Instructions (Addendum)
Thank you for coming to the office today. ? ?Keep up the good work. ? ?No sugar test today. You've done well overall. ? ?Recommend keep up with 6 month visit in [place] and also with Diabetic Eye Exam. ? ?As discussed, the recommendation is to keep on both the prostate hormone shot and pill to keep it controlled. Last PSA undetectable < 0.01. ? ?DUE for FASTING BLOOD WORK (no food or drink after midnight before the lab appointment, only water or coffee without cream/sugar on the morning of) ? ?SCHEDULE "Lab Only" visit in the morning at the clinic for lab draw in 6 MONTHS  ? ?- Make sure Lab Only appointment is at about 1 week before your next appointment, so that results will be available ? ?For Lab Results, once available within 2-3 days of blood draw, you can can log in to MyChart online to view your results and a brief explanation. Also, we can discuss results at next follow-up visit. ? ? ?Please schedule a Follow-up Appointment to: Return in about 6 months (around 01/16/2022) for 6 month fasting lab only then 1 week later Annual Physical. ? ?If you have any other questions or concerns, please feel free to call the office or send a message through [place]. You may also schedule an earlier appointment if necessary. ? ?Additionally, you may be receiving a survey about your experience at our office within a few days to 1 week by e-mail or mail. We value your feedback. ? ?[name], DO ?[place] ?

## 2021-07-17 NOTE — Progress Notes (Signed)
? ?Subjective:  ? ? Patient ID: Eric Daft., male    DOB: [birth year], 76 y.o.   MRN: [id number] ? ?Eric Tagg. is a 76 y.o. male presenting on 07/17/2021 for Diabetes ? ? ?HPI ? ?CHRONIC HTN: ?Controlled. Not checking regularly now. ?Current Meds - Lisinopril '30mg'$  daily, HCTZ 12.'5mg'$  daily ?Reports good compliance, took meds today. Tolerating well, w/o complaints. ?- Admits mild varicose veins edema, but improved on compression stocking ?Denies CP, dyspnea, HA, dizziness / lightheadedness ?  ?Type 2 Diabetes ?Lab result A1c 6.4 on A1c ?CBGs Never checked. ?Meds: never on meds ?Currently on ACEi ?Lifestyle: ?- Diet (Trying to maintain improved diet, reduce carbs portions, inc water) ?- Exercise (Improved activity and exercise) ?- S/p L Great Toe Amputation ?Next DM Eye Exam 12/2020 Dr Terrilee Files ?Denies hypoglycemia, polyuria, visual changes, numbness or tingling. ?  ?HYPERLIPIDEMIA: ?- Reports no concerns. ?- Currently taking Rosuvastatin '10mg'$ , tolerating well without side effects or myalgias ?  ?OSA, on CPAP ?- Patient reports prior history of dx OSA and on CPAP for >20 years, prior to treatment his initial symptoms were snoring, daytime sleepiness and fatigue, he has had several sleep studies in the past. Most recent 07-28-2014, with AHI 6.8 per hour, and mild to moderate OSA. ?- He reports that his sleep apnea is well controlled. He uses the CPAP machine every night. He tolerates the machine well, and thinks that he sleeps better with it and feels good. No new concerns or symptoms.  ?  ?PMH ?- Melanoma, Left big toe / History of Skin Cancer S/p amputation - followed by [facility] ?  ?PMH ?Chronic Low Back Pain, with osteoarthritis ?Has seen Vance Peper PA - [place], had x-rays, copied below of lumbar spine, has gradually improved, advanced OA/DJD, no other intervention. He can return if worsening. ?  ?S/p prostate cancer ?Followed by Dr Janese Banks and Dr Baruch Gouty for Prostate Cancer ?Last report updated  06/27/21 with PSA still undetectable < 0.01 on therapy. ?He continues on Lupron injection plus apalutamide ? ?  ?Health Maintenance: ?  ?- UTD Pneumonia vaccine, received Pneumovax-23 at age 35 (2014), and then next dose was Prevnar-13 12/2013, has received both after age 7, therefore completed ? ? ?  07/17/2021  ? 11:08 AM 01/11/2021  ?  9:11 AM 01/12/2020  ?  9:16 AM  ?Depression screen PHQ 2/9  ?Decreased Interest 0 0 0  ?Down, Depressed, Hopeless 0 0 0  ?PHQ - 2 Score 0 0 0  ?Altered sleeping 0 0   ?Tired, decreased energy 1 1   ?Change in appetite 0 0   ?Feeling bad or failure about yourself  0 0   ?Trouble concentrating 0 0   ?Moving slowly or fidgety/restless 0 0   ?Suicidal thoughts 0    ?PHQ-9 Score 1 1   ?Difficult doing work/chores Not difficult at all Not difficult at all   ? ? ?Social History  ? ?Tobacco Use  ? Smoking status: Former  ?  Packs/day: 1.00  ?  Years: 26.00  ?  Pack years: 26.00  ?  Types: Cigarettes  ?  Quit date: 03/26/1989  ?  Years since quitting: 32.3  ? Smokeless tobacco: Former  ? Tobacco comments:  ?  less than 1 PPD  ?Vaping Use  ? Vaping Use: Never used  ?Substance Use Topics  ? Alcohol use: No  ?  Alcohol/week: 0.0 standard drinks  ? Drug use: No  ? ? ?Review of Systems ?Per  HPI unless specifically indicated above ? ?   ?Objective:  ?  ?BP 128/62   Pulse 66   Ht '5\' 10"'$  (1.778 m)   Wt 241 lb 9.6 oz (109.6 kg)   SpO2 99%   BMI 34.67 kg/m?   ?Wt Readings from Last 3 Encounters:  ?07/17/21 241 lb 9.6 oz (109.6 kg)  ?06/27/21 241 lb 3.2 oz (109.4 kg)  ?04/28/21 242 lb 3.2 oz (109.9 kg)  ?  ?Physical Exam ?Vitals and nursing note reviewed.  ?Constitutional:   ?   General: He is not in acute distress. ?   Appearance: He is well-developed. He is not diaphoretic.  ?   Comments: Well-appearing, comfortable, cooperative  ?HENT:  ?   Head: Normocephalic and atraumatic.  ?Eyes:  ?   General:     ?   Right eye: No discharge.     ?   Left eye: No discharge.  ?   Conjunctiva/sclera:  Conjunctivae normal.  ?Neck:  ?   Thyroid: No thyromegaly.  ?Cardiovascular:  ?   Rate and Rhythm: Normal rate and regular rhythm.  ?   Pulses: Normal pulses.  ?   Heart sounds: Normal heart sounds. No murmur heard. ?Pulmonary:  ?   Effort: Pulmonary effort is normal. No respiratory distress.  ?   Breath sounds: Normal breath sounds. No wheezing or rales.  ?Musculoskeletal:     ?   General: Normal range of motion.  ?   Cervical back: Normal range of motion and neck supple.  ?Lymphadenopathy:  ?   Cervical: No cervical adenopathy.  ?Skin: ?   General: Skin is warm and dry.  ?   Findings: No erythema or rash.  ?Neurological:  ?   Mental Status: He is alert and oriented to person, place, and time. Mental status is at baseline.  ?Psychiatric:     ?   Behavior: Behavior normal.  ?   Comments: Well groomed, good eye contact, normal speech and thoughts  ? ? ? ?Results for orders placed or performed in visit on 06/27/21  ?PSA  ?Result Value Ref Range  ? Prostatic Specific Antigen <0.01 0.00 - 4.00 ng/mL  ?Testosterone  ?Result Value Ref Range  ? Testosterone 10 (L) 264 - 916 ng/dL  ?Comprehensive metabolic panel  ?Result Value Ref Range  ? Sodium 137 135 - 145 mmol/L  ? Potassium 3.9 3.5 - 5.1 mmol/L  ? Chloride 103 98 - 111 mmol/L  ? CO2 25 22 - 32 mmol/L  ? Glucose, Bld 153 (H) 70 - 99 mg/dL  ? BUN 19 8 - 23 mg/dL  ? Creatinine, Ser 1.11 0.61 - 1.24 mg/dL  ? Calcium 9.1 8.9 - 10.3 mg/dL  ? Total Protein 7.0 6.5 - 8.1 g/dL  ? Albumin 4.0 3.5 - 5.0 g/dL  ? AST 21 15 - 41 U/L  ? ALT 12 0 - 44 U/L  ? Alkaline Phosphatase 61 38 - 126 U/L  ? Total Bilirubin 0.5 0.3 - 1.2 mg/dL  ? GFR, Estimated >60 >60 mL/min  ? Anion gap 9 5 - 15  ?CBC with Differential  ?Result Value Ref Range  ? WBC 7.5 4.0 - 10.5 K/uL  ? RBC 4.55 4.22 - 5.81 MIL/uL  ? Hemoglobin 13.3 13.0 - 17.0 g/dL  ? HCT 40.2 39.0 - 52.0 %  ? MCV 88.4 80.0 - 100.0 fL  ? MCH 29.2 26.0 - 34.0 pg  ? MCHC 33.1 30.0 - 36.0 g/dL  ? RDW 12.8 11.5 -  15.5 %  ? Platelets 220 150 -  400 K/uL  ? nRBC 0.0 0.0 - 0.2 %  ? Neutrophils Relative % 71 %  ? Neutro Abs 5.4 1.7 - 7.7 K/uL  ? Lymphocytes Relative 16 %  ? Lymphs Abs 1.2 0.7 - 4.0 K/uL  ? Monocytes Relative 8 %  ? Monocytes Absolute 0.6 0.1 - 1.0 K/uL  ? Eosinophils Relative 4 %  ? Eosinophils Absolute 0.3 0.0 - 0.5 K/uL  ? Basophils Relative 1 %  ? Basophils Absolute 0.1 0.0 - 0.1 K/uL  ? Immature Granulocytes 0 %  ? Abs Immature Granulocytes 0.02 0.00 - 0.07 K/uL  ? ?   ?Assessment & Plan:  ? ?Problem List Items Addressed This Visit   ? ? Type 2 diabetes mellitus with other specified complication ([place]) - Primary  ?  Previously controlled type 2 diabetes, prior at goal < 7-8 ?Skip A1c no POC fingerstick ? ?No hypoglycemia or hyperglycemia ?Complications - hyperlipidemia, OSA - increases risk of future cardiovascular complications  ? ?Plan:  ?- Not on medication for DM, it is diet controlled ?- Encourage improved lifestyle - low carb, low sugar diet, reduce portion size, continue improving regular exercise ?- May continue chronic case management for comprehensive nursing education on diabetes among other benefits ?- Continue ASA, ACEi, Statin ? ?Future DM Eye Dr Terrilee Files ?  ?  ? Relevant Medications  ? atorvastatin (LIPITOR) 10 MG tablet  ? OSA on CPAP  ?  Well controlled, chronic mild-mod OSA on CPAP ?- Good adherence to CPAP nightly ?- Continue current CPAP therapy, patient is benefiting from therapy ? ?  ?  ? Hyperlipidemia associated with type 2 diabetes mellitus ([place])  ?  Controlled cholesterol on lifestyle ? ?Plan: ?1. Continue Rosuvastatin '10mg'$  nightly ?2. Continue ASA '81mg'$  for primary ASCVD risk reduction ?3. Encourage improved lifestyle - low carb/cholesterol, reduce portion size, continue improving regular exercise ? ?  ?  ? Relevant Medications  ? atorvastatin (LIPITOR) 10 MG tablet  ? Essential hypertension  ?  Controlled ?- Home BP readings not available ?No known complications  ?OFF Furosemide ?  ? ?Plan:  ?1. Continue HCTZ  12.'5mg'$  daily for HTN and also likely for edema, continue Lisinopril '30mg'$  daily ?2. Encourage improved lifestyle - low sodium diet, improve regular exercise ?3. Continue monitor BP outside office, bring readings to next

## 2021-07-17 NOTE — Assessment & Plan Note (Signed)
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 

## 2021-07-17 NOTE — Assessment & Plan Note (Signed)
Controlled ?- Home BP readings not available ?No known complications  ?OFF Furosemide ?  ? ?Plan:  ?1. Continue HCTZ 12.'5mg'$  daily for HTN and also likely for edema, continue Lisinopril '30mg'$  daily ?2. Encourage improved lifestyle - low sodium diet, improve regular exercise ?3. Continue monitor BP outside office, bring readings to next visit, if persistently >140/90 or new symptoms notify office sooner ?

## 2021-07-18 ENCOUNTER — Encounter: Payer: Self-pay | Admitting: Dermatology

## 2021-07-28 ENCOUNTER — Other Ambulatory Visit (HOSPITAL_COMMUNITY): Payer: Self-pay

## 2021-07-31 ENCOUNTER — Other Ambulatory Visit (HOSPITAL_COMMUNITY): Payer: Self-pay

## 2021-08-08 ENCOUNTER — Other Ambulatory Visit (HOSPITAL_COMMUNITY): Payer: Self-pay

## 2021-08-16 ENCOUNTER — Other Ambulatory Visit: Payer: Self-pay | Admitting: Dermatology

## 2021-08-25 ENCOUNTER — Other Ambulatory Visit (HOSPITAL_COMMUNITY): Payer: Self-pay

## 2021-08-28 ENCOUNTER — Inpatient Hospital Stay: Payer: PPO | Attending: Oncology

## 2021-08-28 DIAGNOSIS — C61 Malignant neoplasm of prostate: Secondary | ICD-10-CM | POA: Insufficient documentation

## 2021-08-28 LAB — COMPREHENSIVE METABOLIC PANEL
ALT: 13 U/L (ref 0–44)
AST: 22 U/L (ref 15–41)
Albumin: 3.9 g/dL (ref 3.5–5.0)
Alkaline Phosphatase: 62 U/L (ref 38–126)
Anion gap: 8 (ref 5–15)
BUN: 24 mg/dL — ABNORMAL HIGH (ref 8–23)
CO2: 24 mmol/L (ref 22–32)
Calcium: 8.6 mg/dL — ABNORMAL LOW (ref 8.9–10.3)
Chloride: 104 mmol/L (ref 98–111)
Creatinine, Ser: 1.24 mg/dL (ref 0.61–1.24)
GFR, Estimated: >60 mL/min (ref >60)
Glucose, Bld: 146 mg/dL — ABNORMAL HIGH (ref 70–99)
Potassium: 3.9 mmol/L (ref 3.5–5.1)
Sodium: 136 mmol/L (ref 135–145)
Total Bilirubin: 0.1 mg/dL — ABNORMAL LOW (ref 0.3–1.2)
Total Protein: 7.1 g/dL (ref 6.5–8.1)

## 2021-08-28 LAB — CBC WITH DIFFERENTIAL/PLATELET
Abs Immature Granulocytes: 0.03 10*3/uL (ref 0.00–0.07)
Basophils Absolute: 0 10*3/uL (ref 0.0–0.1)
Basophils Relative: 0 %
Eosinophils Absolute: 0.2 10*3/uL (ref 0.0–0.5)
Eosinophils Relative: 3 %
HCT: 38.9 % — ABNORMAL LOW (ref 39.0–52.0)
Hemoglobin: 13.1 g/dL (ref 13.0–17.0)
Immature Granulocytes: 0 %
Lymphocytes Relative: 16 %
Lymphs Abs: 1.1 10*3/uL (ref 0.7–4.0)
MCH: 29.9 pg (ref 26.0–34.0)
MCHC: 33.7 g/dL (ref 30.0–36.0)
MCV: 88.8 fL (ref 80.0–100.0)
Monocytes Absolute: 0.5 10*3/uL (ref 0.1–1.0)
Monocytes Relative: 7 %
Neutro Abs: 5 10*3/uL (ref 1.7–7.7)
Neutrophils Relative %: 74 %
Platelets: 217 10*3/uL (ref 150–400)
RBC: 4.38 MIL/uL (ref 4.22–5.81)
RDW: 13.3 % (ref 11.5–15.5)
WBC: 6.9 10*3/uL (ref 4.0–10.5)
nRBC: 0 % (ref 0.0–0.2)

## 2021-08-28 LAB — PSA: Prostatic Specific Antigen: <0.01 ng/mL (ref 0.00–4.00)

## 2021-09-12 ENCOUNTER — Other Ambulatory Visit (HOSPITAL_COMMUNITY): Payer: Self-pay

## 2021-09-19 ENCOUNTER — Other Ambulatory Visit (HOSPITAL_COMMUNITY): Payer: Self-pay

## 2021-10-05 ENCOUNTER — Other Ambulatory Visit (HOSPITAL_COMMUNITY): Payer: Self-pay

## 2021-10-09 ENCOUNTER — Other Ambulatory Visit (HOSPITAL_COMMUNITY): Payer: Self-pay

## 2021-10-18 ENCOUNTER — Other Ambulatory Visit (HOSPITAL_COMMUNITY): Payer: Self-pay

## 2021-10-25 ENCOUNTER — Encounter: Payer: Self-pay | Admitting: Oncology

## 2021-10-25 ENCOUNTER — Inpatient Hospital Stay: Payer: PPO | Attending: Oncology

## 2021-10-25 ENCOUNTER — Inpatient Hospital Stay: Payer: PPO

## 2021-10-25 ENCOUNTER — Inpatient Hospital Stay: Payer: PPO | Admitting: Oncology

## 2021-10-25 VITALS — BP 115/67 | HR 71 | Temp 97.8°F | Resp 19 | Wt 240.1 lb

## 2021-10-25 DIAGNOSIS — C61 Malignant neoplasm of prostate: Secondary | ICD-10-CM

## 2021-10-25 DIAGNOSIS — Z79818 Long term (current) use of other agents affecting estrogen receptors and estrogen levels: Secondary | ICD-10-CM

## 2021-10-25 DIAGNOSIS — Z5181 Encounter for therapeutic drug level monitoring: Secondary | ICD-10-CM | POA: Diagnosis not present

## 2021-10-25 DIAGNOSIS — Z79899 Other long term (current) drug therapy: Secondary | ICD-10-CM | POA: Diagnosis not present

## 2021-10-25 DIAGNOSIS — N179 Acute kidney failure, unspecified: Secondary | ICD-10-CM | POA: Insufficient documentation

## 2021-10-25 DIAGNOSIS — Z5111 Encounter for antineoplastic chemotherapy: Secondary | ICD-10-CM | POA: Insufficient documentation

## 2021-10-25 LAB — CBC WITH DIFFERENTIAL/PLATELET
Abs Immature Granulocytes: 0.02 10*3/uL (ref 0.00–0.07)
Basophils Absolute: 0 10*3/uL (ref 0.0–0.1)
Basophils Relative: 1 %
Eosinophils Absolute: 0.2 10*3/uL (ref 0.0–0.5)
Eosinophils Relative: 3 %
HCT: 36.7 % — ABNORMAL LOW (ref 39.0–52.0)
Hemoglobin: 12.3 g/dL — ABNORMAL LOW (ref 13.0–17.0)
Immature Granulocytes: 0 %
Lymphocytes Relative: 18 %
Lymphs Abs: 1.4 10*3/uL (ref 0.7–4.0)
MCH: 30.3 pg (ref 26.0–34.0)
MCHC: 33.5 g/dL (ref 30.0–36.0)
MCV: 90.4 fL (ref 80.0–100.0)
Monocytes Absolute: 0.6 10*3/uL (ref 0.1–1.0)
Monocytes Relative: 8 %
Neutro Abs: 5.5 10*3/uL (ref 1.7–7.7)
Neutrophils Relative %: 70 %
Platelets: 219 10*3/uL (ref 150–400)
RBC: 4.06 MIL/uL — ABNORMAL LOW (ref 4.22–5.81)
RDW: 13.2 % (ref 11.5–15.5)
WBC: 7.7 10*3/uL (ref 4.0–10.5)
nRBC: 0 % (ref 0.0–0.2)

## 2021-10-25 LAB — COMPREHENSIVE METABOLIC PANEL
ALT: 12 U/L (ref 0–44)
AST: 19 U/L (ref 15–41)
Albumin: 4.1 g/dL (ref 3.5–5.0)
Alkaline Phosphatase: 60 U/L (ref 38–126)
Anion gap: 8 (ref 5–15)
BUN: 37 mg/dL — ABNORMAL HIGH (ref 8–23)
CO2: 25 mmol/L (ref 22–32)
Calcium: 9.3 mg/dL (ref 8.9–10.3)
Chloride: 106 mmol/L (ref 98–111)
Creatinine, Ser: 1.75 mg/dL — ABNORMAL HIGH (ref 0.61–1.24)
GFR, Estimated: 40 mL/min — ABNORMAL LOW (ref >60)
Glucose, Bld: 123 mg/dL — ABNORMAL HIGH (ref 70–99)
Potassium: 4.3 mmol/L (ref 3.5–5.1)
Sodium: 139 mmol/L (ref 135–145)
Total Bilirubin: 0.3 mg/dL (ref 0.3–1.2)
Total Protein: 6.9 g/dL (ref 6.5–8.1)

## 2021-10-25 LAB — PSA: Prostatic Specific Antigen: <0.01 ng/mL (ref 0.00–4.00)

## 2021-10-25 MED ORDER — LEUPROLIDE ACETATE (6 MONTH) 45 MG ~~LOC~~ KIT
45.0000 mg | PACK | Freq: Once | SUBCUTANEOUS | Status: AC
  Administered 2021-10-25: 45 mg via SUBCUTANEOUS
  Filled 2021-10-25: qty 45

## 2021-10-25 NOTE — Progress Notes (Signed)
Hematology/Oncology Consult note [facility]  [phone] [phone]  Patient Care Team: Olin Hauser, DO as PCP - General (Family Medicine) Luan Pulling Ronelle Nigh., MD (Inactive) (Unknown Physician Specialty) Grace Isaac, MD (Inactive) as Consulting Physician (Cardiothoracic Surgery) Clance, Armando Reichert, MD as Consulting Physician (Pulmonary Disease) Grace Isaac, MD (Inactive) as Consulting Physician (Cardiothoracic Surgery) Clance, Armando Reichert, MD as Consulting Physician (Pulmonary Disease) Minor, Dalbert Garnet, RN (Inactive) as Case Manager Sindy Guadeloupe, MD as Consulting Physician (Hematology and Oncology)   Name of the patient: Eric Stone  [id number]  06/28/1945   Date of visit: 10/25/21  Diagnosis-castrate resistant nonmetastatic prostate cancer  Chief complaint/ Reason for visit-routine follow-up of prostate cancer on Lupron and apalutamide  Heme/Onc history: patient is a 76 year old male who was diagnosed with Gleason 6 adenocarcinoma of the prostateAbout 5 years ago and received radiation treatment.  He has not undergone the surgery for his prostate but has seen Dr. Bernardo Heater in the past.  He was receiving intermittent ADT and last received 45 mg of Eligard on 09/30/2018.  His PSA following that went down to 0.87 December 2020.  He did not receive any ADT after that and his most recent PSA on 09/24/2019 was elevated at 5.08.  Patient reports having significant hot flashes and fatigue when he gets ADT.    Patient presently on continuous ADT since July 2021 which she gets every 6 months.  Bone scan did not show any evidence of metastatic disease.   Patient noted to have PSA doubling time of about 3 months in August 2022.  CT abdomen and bone scan did not show any evidence of distant metastatic disease.Patient started on apalutamide for castrate resistant nonmetastatic prostate cancer    Interval history-patient is tolerating  apalutamide well and has not missed any doses.  He has baseline fatigue and some self-limited hot flashes but denies other complaints.  ECOG PS- 1 Pain scale- 0   Review of systems- Review of Systems  Constitutional:  Positive for malaise/fatigue. Negative for chills, fever and weight loss.  HENT:  Negative for congestion, ear discharge and nosebleeds.   Eyes:  Negative for blurred vision.  Respiratory:  Negative for cough, hemoptysis, sputum production, shortness of breath and wheezing.   Cardiovascular:  Negative for chest pain, palpitations, orthopnea and claudication.  Gastrointestinal:  Negative for abdominal pain, blood in stool, constipation, diarrhea, heartburn, melena, nausea and vomiting.  Genitourinary:  Negative for dysuria, flank pain, frequency, hematuria and urgency.  Musculoskeletal:  Negative for back pain, joint pain and myalgias.  Skin:  Negative for rash.  Neurological:  Negative for dizziness, tingling, focal weakness, seizures, weakness and headaches.  Endo/Heme/Allergies:  Does not bruise/bleed easily.       Hot flashes  Psychiatric/Behavioral:  Negative for depression and suicidal ideas. The patient does not have insomnia.       Allergies  Allergen Reactions   Penicillins Other (See Comments)    As a child     Past Medical History:  Diagnosis Date   Arthritis    H/O blood clots    H/O blood clots    in left leg in 1988   Hx of dysplastic nevus 06/18/2013   L lat ankle - mild   Melanoma ([place]) 02/21/2016   L prox med plantar great toe - Breslow's 1.43m, Clark level early 4   Prostate cancer (HCC)    Rash    occasionally and pt does see a  dermatologist   Sleep apnea    uses a CPAP;sleep study >97yr ago     Past Surgical History:  Procedure Laterality Date   TOE SURGERY     melanoma   TOTAL KNEE ARTHROPLASTY  2009-2010   bilateral   VIDEO ASSISTED THORACOSCOPY (VATS)/DECORTICATION Left 05/06/2012   Procedure: VIDEO ASSISTED THORACOSCOPY  (VATS) drainage loculated pleural effusion;  Surgeon: EGrace Isaac MD;  [place]  Service: Thoracic;  Laterality: Left;   VIDEO BRONCHOSCOPY N/A 05/06/2012   Procedure: VIDEO BRONCHOSCOPY;  Surgeon: EGrace Isaac MD;  [place] [facility]  Service: Thoracic;  Laterality: N/A;    Social History   Socioeconomic History   Marital status: Married    Spouse name: Not on file   Number of children: Not on file   Years of education: tech school   Highest education level: High school graduate  Occupational History   Occupation: retired  Tobacco Use   Smoking status: Former    Packs/day: 1.00    Years: 26.00    Total pack years: 26.00    Types: Cigarettes    Quit date: 03/26/1989    Years since quitting: 32.6   Smokeless tobacco: Former   Tobacco comments:    less than 1 PPD  Vaping Use   Vaping Use: Never used  Substance and Sexual Activity   Alcohol use: No    Alcohol/week: 0.0 standard drinks of alcohol   Drug use: No   Sexual activity: Yes  Other Topics Concern   Not on file  Social History Narrative   Not on file   Social Determinants of Health   Financial Resource Strain: Low Risk  (01/07/2018)   Overall Financial Resource Strain (CARDIA)    Difficulty of Paying Living Expenses: Not hard at all  Food Insecurity: No Food Insecurity (01/07/2018)   Hunger Vital Sign    Worried About Running Out of Food in the Last Year: Never true    RAmanain the Last Year: Never true  Transportation Needs: No Transportation Needs (01/07/2018)   PRAPARE - THydrologist(Medical): No    Lack of Transportation (Non-Medical): No  Physical Activity: Inactive (01/07/2018)   Exercise Vital Sign    Days of Exercise per Week: 0 days    Minutes of Exercise per Session: 0 min  Stress: No Stress Concern Present (01/07/2018)   [place]   Feeling of Stress : Not at all  Social  Connections: Moderately Integrated (01/07/2018)   Social Connection and Isolation Panel [NHANES]    Frequency of Communication with Friends and Family: More than three times a week    Frequency of Social Gatherings with Friends and Family: More than three times a week    Attends Religious Services: More than 4 times per year    Active Member of [employer] Organizations: No    Attends CArchivistMeetings: Never    Marital Status: Married  IHuman resources officerViolence: Not At Risk (01/07/2018)   Humiliation, Afraid, Rape, and Kick questionnaire    Fear of Current or Ex-Partner: No    Emotionally Abused: No    Physically Abused: No    Sexually Abused: No    Family History  Problem Relation Age of Onset   Hypertension Mother    Stroke Mother        possible   Cancer Brother      Current  Outpatient Medications:    apalutamide (ERLEADA) 60 MG tablet, Take 4 tablets (240 mg total) by mouth daily., Disp: 120 tablet, Rfl: 5   aspirin EC 81 MG tablet, Take 81 mg by mouth., Disp: , Rfl:    atorvastatin (LIPITOR) 10 MG tablet, Take 1 tablet (10 mg total) by mouth at bedtime., Disp: 90 tablet, Rfl: 3   hydrochlorothiazide (HYDRODIURIL) 12.5 MG tablet, Take 1 tablet (12.5 mg total) by mouth daily., Disp: 90 tablet, Rfl: 3   lisinopril (ZESTRIL) 30 MG tablet, Take 1 tablet (30 mg total) by mouth daily., Disp: 90 tablet, Rfl: 3   pimecrolimus (ELIDEL) 1 % cream, APPLY   TOPICALLY TO AFFECTED AREA IN GROIN TWICE DAILY, Disp: 60 g, Rfl: 0  Physical exam:  Vitals:   10/25/21 1356  BP: 115/67  Pulse: 71  Resp: 19  Temp: 97.8 F (36.6 C)  SpO2: 97%  Weight: 240 lb 1.6 oz (108.9 kg)   Physical Exam Cardiovascular:     Rate and Rhythm: Normal rate and regular rhythm.     Heart sounds: Normal heart sounds.  Pulmonary:     Effort: Pulmonary effort is normal.     Breath sounds: Normal breath sounds.  Abdominal:     General: Bowel sounds are normal.     Palpations: Abdomen is soft.   Skin:    General: Skin is warm and dry.  Neurological:     Mental Status: He is alert and oriented to person, place, and time.         Latest Ref Rng & Units 10/25/2021    1:30 PM  CMP  Glucose 70 - 99 mg/dL 123   BUN 8 - 23 mg/dL 37   Creatinine 0.61 - 1.24 mg/dL 1.75   Sodium 135 - 145 mmol/L 139   Potassium 3.5 - 5.1 mmol/L 4.3   Chloride 98 - 111 mmol/L 106   CO2 22 - 32 mmol/L 25   Calcium 8.9 - 10.3 mg/dL 9.3   Total Protein 6.5 - 8.1 g/dL 6.9   Total Bilirubin 0.3 - 1.2 mg/dL 0.3   Alkaline Phos 38 - 126 U/L 60   AST 15 - 41 U/L 19   ALT 0 - 44 U/L 12       Latest Ref Rng & Units 10/25/2021    1:30 PM  CBC  WBC 4.0 - 10.5 K/uL 7.7   Hemoglobin 13.0 - 17.0 g/dL 12.3   Hematocrit 39.0 - 52.0 % 36.7   Platelets 150 - 400 K/uL 219      Assessment and plan- Patient is a 76 y.o. male with nonmetastatic castrate resistant prostate cancer on Lupron and apalutamide here for routine follow-up  PSA remains undetectable and patient is tolerating apalutamide well without any significant side effects other than self-limited hot flashes which she will continue to monitor.  He will receive Lupron today and then again in 6 months.  I will see him back in 3 months with CBC with differential CMP and PSA.  Plan is to continue apalutamide until progression or toxicity.  His baseline bone density is normal and therefore I will monitor him without giving him bisphosphonates at this time.  AKI: Patient's creatinine at baseline runs between 1.1-1.2 and presently elevated at 1.75.  I have asked him to increase his oral intake of fluids and we will repeat his BMP again in 10 days   Visit Diagnosis 1. High risk medication use   2. Encounter for monitoring Lupron therapy  3. AKI (acute kidney injury) ([place])      Dr. Randa Evens, MD, MPH [facility] [phone] 10/25/2021 3:54 PM

## 2021-10-26 ENCOUNTER — Other Ambulatory Visit (HOSPITAL_COMMUNITY): Payer: Self-pay

## 2021-10-26 ENCOUNTER — Other Ambulatory Visit: Payer: Self-pay | Admitting: Pharmacist

## 2021-10-26 DIAGNOSIS — C61 Malignant neoplasm of prostate: Secondary | ICD-10-CM

## 2021-10-26 MED ORDER — APALUTAMIDE 60 MG PO TABS
240.0000 mg | ORAL_TABLET | Freq: Every day | ORAL | 5 refills | Status: DC
  Filled 2021-10-26 – 2021-11-06 (×2): qty 120, 30d supply, fill #0
  Filled 2021-11-30: qty 120, 30d supply, fill #1
  Filled 2022-01-02: qty 120, 30d supply, fill #2

## 2021-11-03 ENCOUNTER — Other Ambulatory Visit: Payer: Self-pay | Admitting: *Deleted

## 2021-11-03 ENCOUNTER — Inpatient Hospital Stay: Payer: PPO

## 2021-11-03 DIAGNOSIS — Z5111 Encounter for antineoplastic chemotherapy: Secondary | ICD-10-CM | POA: Diagnosis not present

## 2021-11-03 DIAGNOSIS — R748 Abnormal levels of other serum enzymes: Secondary | ICD-10-CM

## 2021-11-03 LAB — BASIC METABOLIC PANEL
Anion gap: 12 (ref 5–15)
BUN: 30 mg/dL — ABNORMAL HIGH (ref 8–23)
CO2: 25 mmol/L (ref 22–32)
Calcium: 9 mg/dL (ref 8.9–10.3)
Chloride: 101 mmol/L (ref 98–111)
Creatinine, Ser: 1.41 mg/dL — ABNORMAL HIGH (ref 0.61–1.24)
GFR, Estimated: 52 mL/min — ABNORMAL LOW (ref >60)
Glucose, Bld: 124 mg/dL — ABNORMAL HIGH (ref 70–99)
Potassium: 3.5 mmol/L (ref 3.5–5.1)
Sodium: 138 mmol/L (ref 135–145)

## 2021-11-06 ENCOUNTER — Other Ambulatory Visit (HOSPITAL_COMMUNITY): Payer: Self-pay

## 2021-11-08 DIAGNOSIS — G4733 Obstructive sleep apnea (adult) (pediatric): Secondary | ICD-10-CM | POA: Diagnosis not present

## 2021-11-13 ENCOUNTER — Other Ambulatory Visit (HOSPITAL_COMMUNITY): Payer: Self-pay

## 2021-11-17 ENCOUNTER — Other Ambulatory Visit: Payer: Self-pay | Admitting: Family Medicine

## 2021-11-17 DIAGNOSIS — I10 Essential (primary) hypertension: Secondary | ICD-10-CM

## 2021-11-17 NOTE — Telephone Encounter (Signed)
Requested Prescriptions  Pending Prescriptions Disp Refills  . lisinopril (ZESTRIL) 30 MG tablet [Pharmacy Med Name: Lisinopril 30 MG Oral Tablet] 90 tablet 0    Sig: Take 1 tablet by mouth once daily     Cardiovascular:  ACE Inhibitors Failed - 11/17/2021  9:49 AM      Failed - Cr in normal range and within 180 days    Creat  Date Value Ref Range Status  01/04/2021 1.17 0.70 - 1.28 mg/dL Final   Creatinine, Ser  Date Value Ref Range Status  11/03/2021 1.41 (H) 0.61 - 1.24 mg/dL Final         Passed - K in normal range and within 180 days    Potassium  Date Value Ref Range Status  11/03/2021 3.5 3.5 - 5.1 mmol/L Final  04/17/2012 3.6 3.5 - 5.1 mmol/L Final         Passed - Patient is not pregnant      Passed - Last BP in normal range    BP Readings from Last 1 Encounters:  10/25/21 115/67         Passed - Valid encounter within last 6 months    Recent Outpatient Visits          4 months ago Type 2 diabetes mellitus with other specified complication, without long-term current use of insulin ([place])   [place], DO   10 months ago Annual physical exam   [place], Devonne Doughty, DO   1 year ago Type 2 diabetes mellitus with other specified complication, without long-term current use of insulin [facility])   [place], DO   1 year ago Chronic left-sided low back pain without sciatica   [facility] Olin Hauser, DO   1 year ago Annual physical exam   [place], DO      Future Appointments            In 2 months [place], Devonne Doughty, [facility], [place]   In 7 months Ralene Bathe, MD [place]           . hydrochlorothiazide (HYDRODIURIL) 12.5 MG tablet [Pharmacy Med Name: hydroCHLOROthiazide 12.5 MG Oral Tablet] 90 tablet 0    Sig: Take 1 tablet by mouth  once daily     Cardiovascular: Diuretics - Thiazide Failed - 11/17/2021  9:49 AM      Failed - Cr in normal range and within 180 days    Creat  Date Value Ref Range Status  01/04/2021 1.17 0.70 - 1.28 mg/dL Final   Creatinine, Ser  Date Value Ref Range Status  11/03/2021 1.41 (H) 0.61 - 1.24 mg/dL Final         Passed - K in normal range and within 180 days    Potassium  Date Value Ref Range Status  11/03/2021 3.5 3.5 - 5.1 mmol/L Final  04/17/2012 3.6 3.5 - 5.1 mmol/L Final         Passed - Na in normal range and within 180 days    Sodium  Date Value Ref Range Status  11/03/2021 138 135 - 145 mmol/L Final  07/12/2015 143 134 - 144 mmol/L Final  04/17/2012 137 136 - 145 mmol/L Final         Passed - Last BP in normal range    BP Readings from Last 1 Encounters:  10/25/21  115/67         Passed - Valid encounter within last 6 months    Recent Outpatient Visits          4 months ago Type 2 diabetes mellitus with other specified complication, without long-term current use of insulin ([place])   [place], DO   10 months ago Annual physical exam   [facility] Olin Hauser, DO   1 year ago Type 2 diabetes mellitus with other specified complication, without long-term current use of insulin [facility])   [place], DO   1 year ago Chronic left-sided low back pain without sciatica   [place], DO   1 year ago Annual physical exam   [place], DO      Future Appointments            In 2 months [place], Devonne Doughty, DO [facility], [place]   In 7 months Ralene Bathe, MD [place]

## 2021-11-30 ENCOUNTER — Other Ambulatory Visit (HOSPITAL_COMMUNITY): Payer: Self-pay

## 2021-12-08 IMAGING — CT CT ABD-PELV W/ CM
2 of 5 series · 16 of 46 positions shown, 18 images · IV contrast (omnipaque)
Comparison: 04/17/2006

CLINICAL DATA: Follow-up prostate carcinoma. Rising PSA. Previous
radiation therapy and androgen deprivation therapy.

EXAM:
CT ABDOMEN AND PELVIS WITH CONTRAST
TECHNIQUE: Multidetector CT imaging of the abdomen and pelvis was performed
using the standard protocol following bolus administration of
intravenous contrast.
CONTRAST:  100mL OMNIPAQUE IOHEXOL 350 MG/ML SOLN

[Series 2: abd pelvis 5.00 · axial · 0.81mm/px · z∈[-1609,-1139]mm · 13 of 108 slices shown, 15 images]
[im 7/108  soft-tissue]
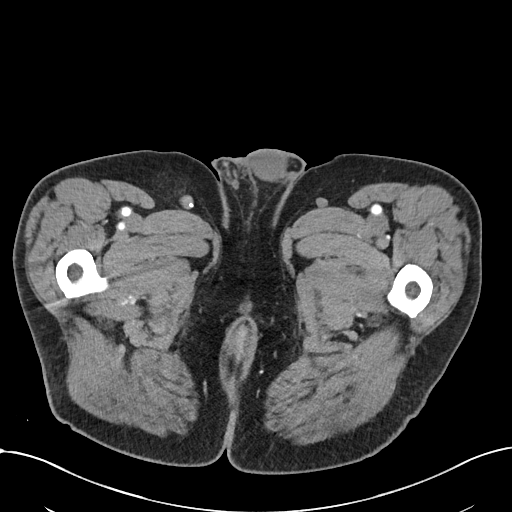
[im 7/108  bone]
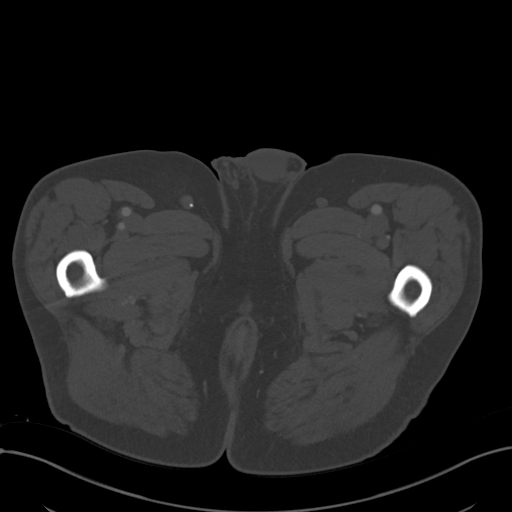
[im 13/108  soft-tissue]
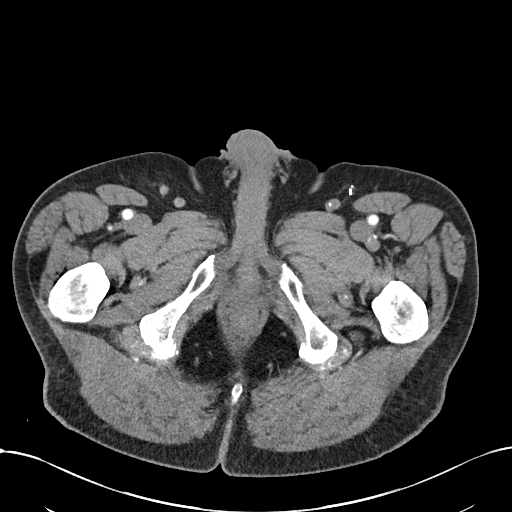
[im 26/108  soft-tissue]
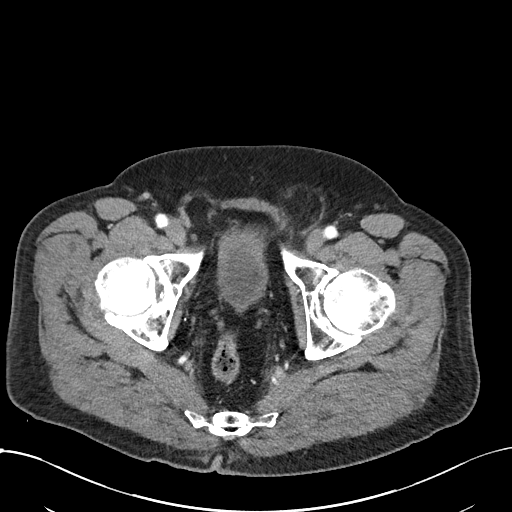
[im 32/108  soft-tissue]
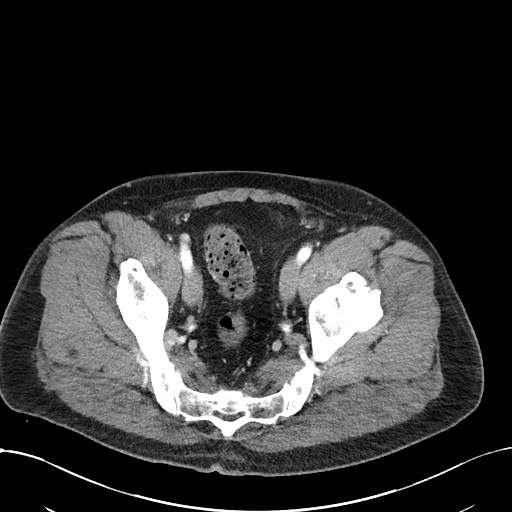
[im 38/108  soft-tissue]
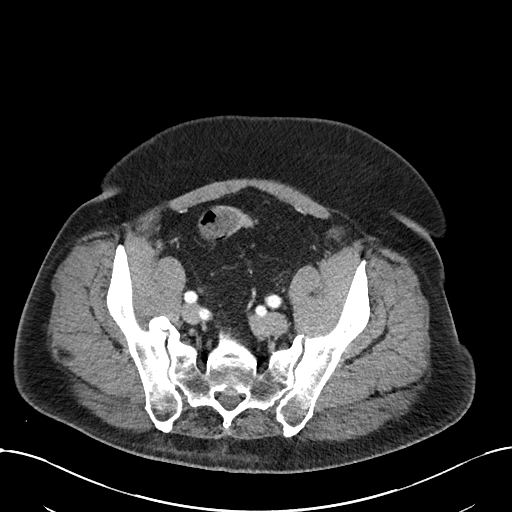
[im 45/108  soft-tissue]
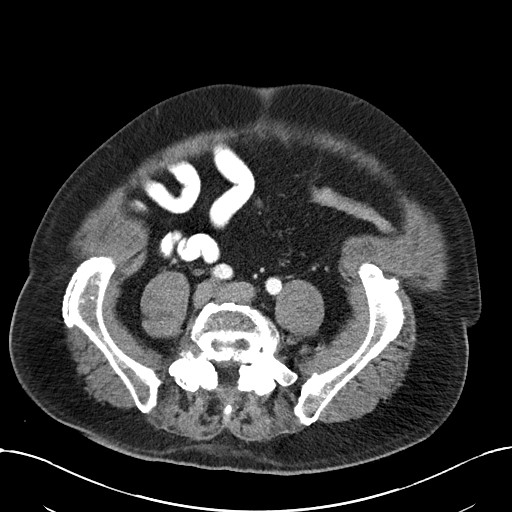
[im 57/108  soft-tissue]
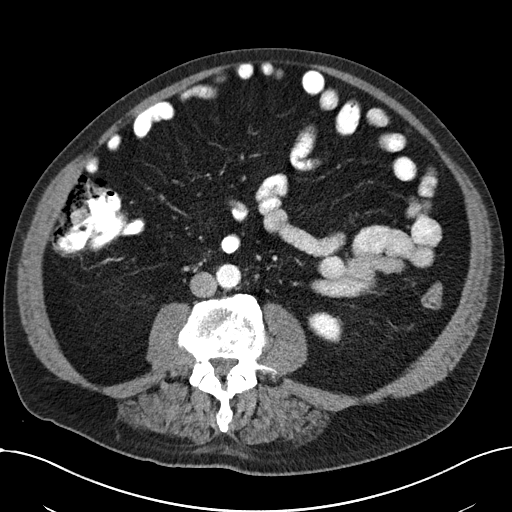
[im 63/108  soft-tissue]
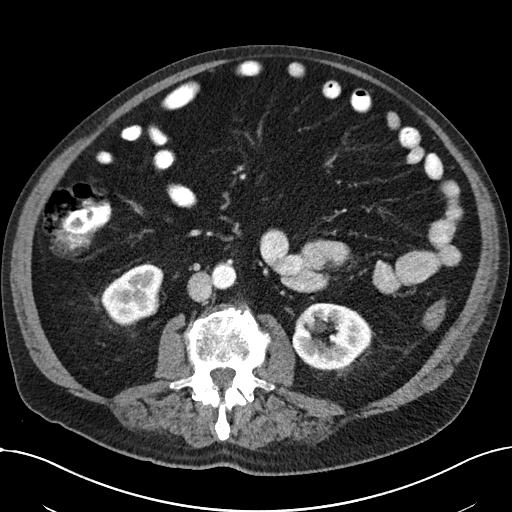
[im 70/108  soft-tissue]
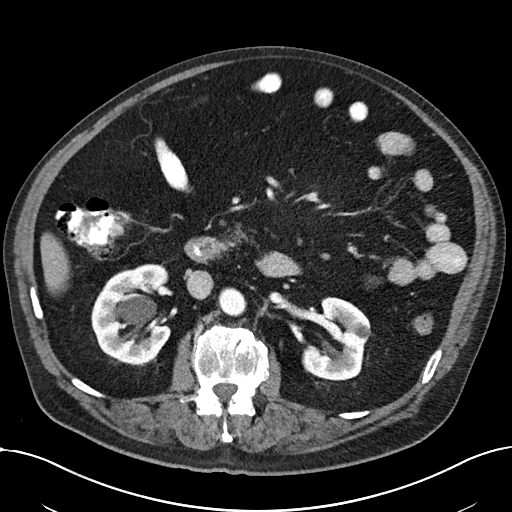
[im 70/108  bone]
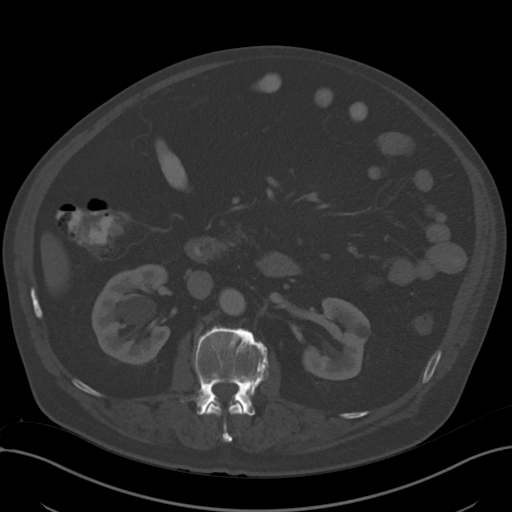
[im 76/108  soft-tissue]
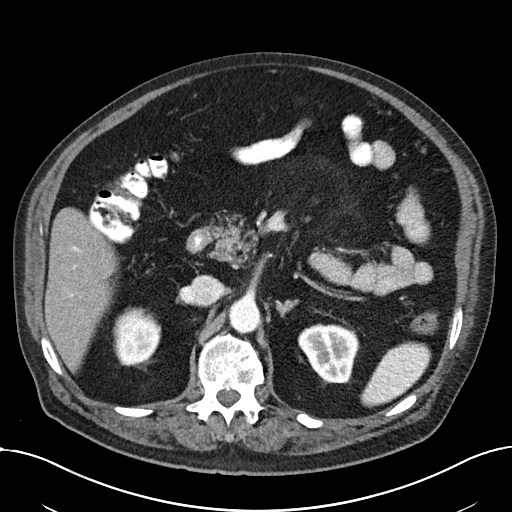
[im 82/108  soft-tissue]
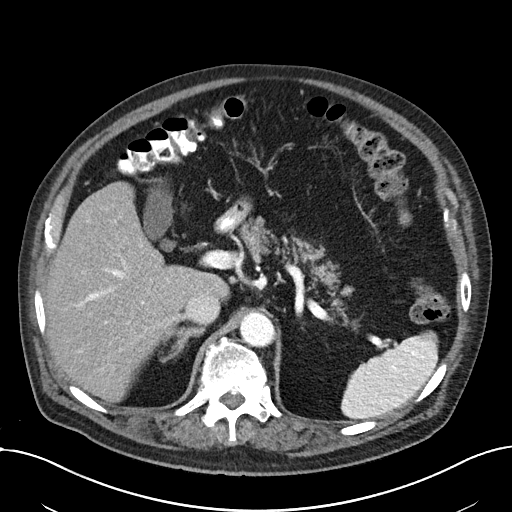
[im 95/108  soft-tissue]
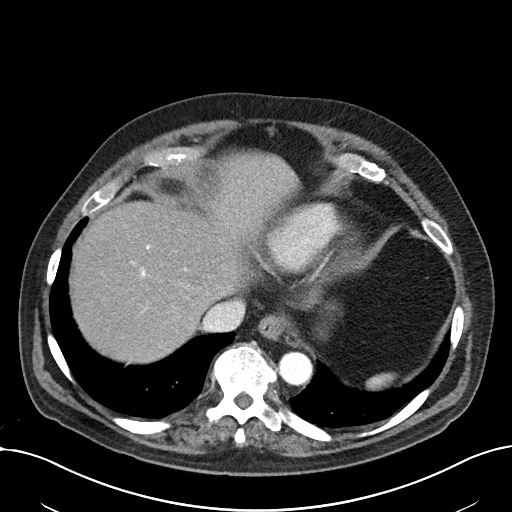
[im 101/108  soft-tissue]
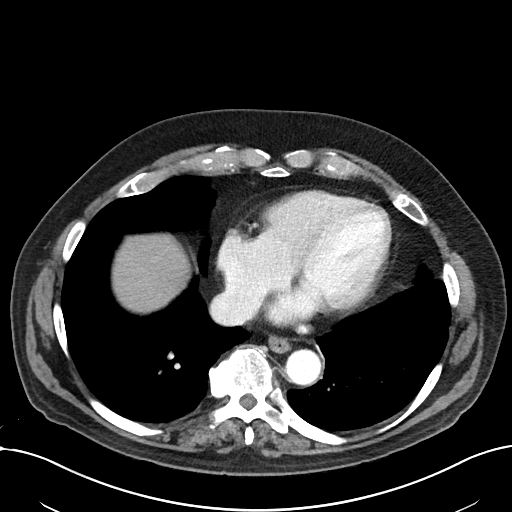

[Series 4: coronals abd pelvis 2.00 cor · coronal · 0.81mm/px · 3 of 179 slices shown]
[im 60/179  soft-tissue]
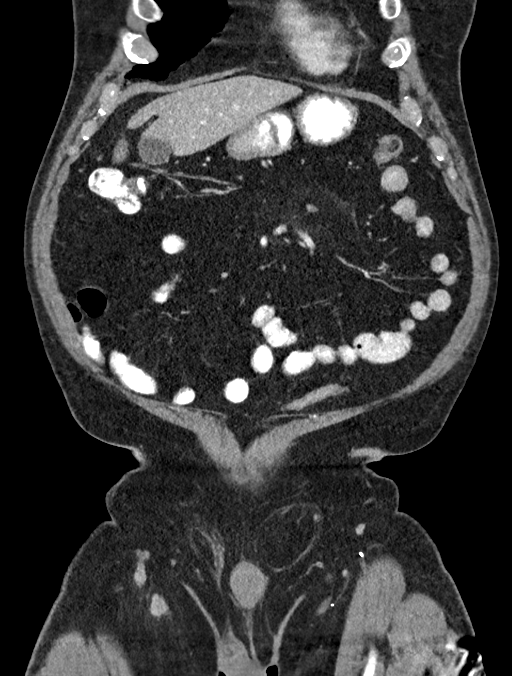
[im 80/179  soft-tissue]
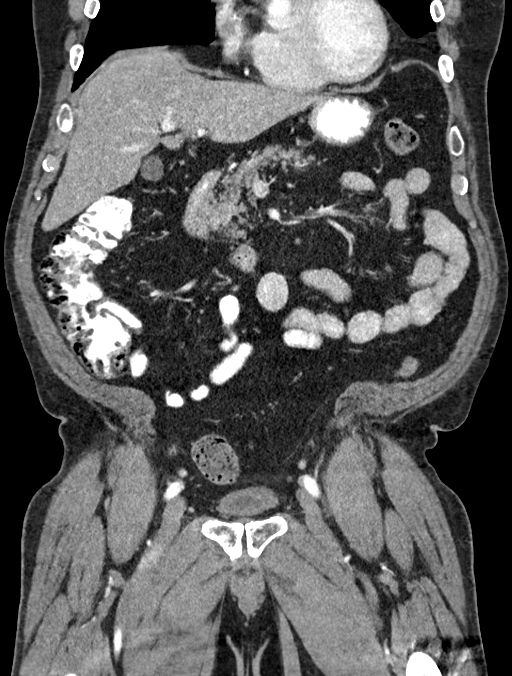
[im 99/179  soft-tissue]
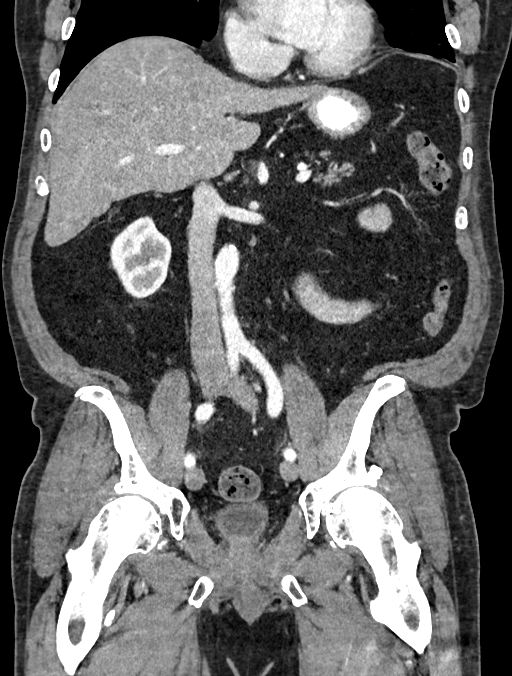

[16 of 46 positions shown; findings below may reference images not displayed]

FINDINGS: Lower Chest: No acute findings.

Hepatobiliary: No hepatic masses identified. Gallbladder is
unremarkable. No evidence of biliary ductal dilatation.

Pancreas:  No mass or inflammatory changes.

Spleen: Within normal limits in size and appearance.

Adrenals/Urinary Tract: No masses identified. A few small renal
cysts are noted. No evidence of ureteral calculi or hydronephrosis.

Stomach/Bowel: No evidence of obstruction, inflammatory process or
abnormal fluid collections. Normal appendix visualized.

Vascular/Lymphatic: No pathologically enlarged lymph nodes. No acute
vascular findings.

Reproductive: Small prostate gland noted. Symmetric seminal
vesicles.

Other:  None.

Musculoskeletal:  No suspicious bone lesions identified.
IMPRESSION: No acute findings. No evidence of recurrent or metastatic carcinoma.

## 2021-12-11 ENCOUNTER — Other Ambulatory Visit (HOSPITAL_COMMUNITY): Payer: Self-pay

## 2021-12-12 ENCOUNTER — Other Ambulatory Visit: Payer: PPO

## 2022-01-02 ENCOUNTER — Other Ambulatory Visit (HOSPITAL_COMMUNITY): Payer: Self-pay

## 2022-01-10 ENCOUNTER — Other Ambulatory Visit: Payer: Self-pay

## 2022-01-10 DIAGNOSIS — Z0000 Encounter for general adult medical examination without abnormal findings: Secondary | ICD-10-CM

## 2022-01-10 DIAGNOSIS — E1169 Type 2 diabetes mellitus with other specified complication: Secondary | ICD-10-CM

## 2022-01-11 ENCOUNTER — Other Ambulatory Visit: Payer: PPO

## 2022-01-11 DIAGNOSIS — E1169 Type 2 diabetes mellitus with other specified complication: Secondary | ICD-10-CM | POA: Diagnosis not present

## 2022-01-11 DIAGNOSIS — E785 Hyperlipidemia, unspecified: Secondary | ICD-10-CM | POA: Diagnosis not present

## 2022-01-11 DIAGNOSIS — Z0000 Encounter for general adult medical examination without abnormal findings: Secondary | ICD-10-CM | POA: Diagnosis not present

## 2022-01-12 LAB — LIPID PANEL
Cholesterol: 155 mg/dL (ref <200)
HDL: 48 mg/dL (ref >=40)
LDL Cholesterol (Calc): 85 mg/dL (calc)
Non-HDL Cholesterol (Calc): 107 mg/dL (calc) (ref <130)
Total CHOL/HDL Ratio: 3.2 (calc) (ref <5.0)
Triglycerides: 120 mg/dL (ref <150)

## 2022-01-12 LAB — HEMOGLOBIN A1C
Hgb A1c MFr Bld: 6.6 % of total Hgb — ABNORMAL HIGH (ref <5.7)
Mean Plasma Glucose: 143 mg/dL
eAG (mmol/L): 7.9 mmol/L

## 2022-01-12 LAB — TSH: TSH: 1.57 mIU/L (ref 0.40–4.50)

## 2022-01-16 ENCOUNTER — Other Ambulatory Visit (HOSPITAL_COMMUNITY): Payer: Self-pay

## 2022-01-16 ENCOUNTER — Telehealth: Payer: Self-pay

## 2022-01-16 NOTE — Telephone Encounter (Signed)
Oral Oncology Patient Advocate Encounter  Reached out and spoke with patient regarding PAP paperwork, explained that I would send it to their preferred email via Docu[employer].   Confirmed email address: [email or web address].    Patient expressed understanding and consent.  Will follow up once paperwork has been signed and returned.   Berdine Addison, [facility] Patient [place]  [phone] (phone) [phone] (fax) 01/16/2022 11:35 AM

## 2022-01-16 NOTE — Telephone Encounter (Signed)
Oral Oncology Patient Advocate Encounter   Began application for assistance for Erleada through [place].   Application will be submitted upon completion of necessary supporting documentation.   [facility]'s phone number [phone].   I will continue to check the status until final determination.   Berdine Addison, [facility] Patient [place]  [phone] (phone) [phone] (fax) 01/16/2022 11:34 AM

## 2022-01-17 ENCOUNTER — Other Ambulatory Visit (HOSPITAL_COMMUNITY): Payer: Self-pay

## 2022-01-18 ENCOUNTER — Ambulatory Visit (INDEPENDENT_AMBULATORY_CARE_PROVIDER_SITE_OTHER): Payer: PPO | Admitting: Family Medicine

## 2022-01-18 ENCOUNTER — Encounter: Payer: Self-pay | Admitting: Family Medicine

## 2022-01-18 ENCOUNTER — Other Ambulatory Visit (HOSPITAL_COMMUNITY): Payer: Self-pay

## 2022-01-18 VITALS — BP 128/72 | HR 62 | Ht 70.0 in | Wt 244.0 lb

## 2022-01-18 DIAGNOSIS — Z23 Encounter for immunization: Secondary | ICD-10-CM | POA: Diagnosis not present

## 2022-01-18 DIAGNOSIS — Z0000 Encounter for general adult medical examination without abnormal findings: Secondary | ICD-10-CM

## 2022-01-18 DIAGNOSIS — Z89412 Acquired absence of left great toe: Secondary | ICD-10-CM

## 2022-01-18 DIAGNOSIS — E1169 Type 2 diabetes mellitus with other specified complication: Secondary | ICD-10-CM | POA: Diagnosis not present

## 2022-01-18 DIAGNOSIS — R7989 Other specified abnormal findings of blood chemistry: Secondary | ICD-10-CM | POA: Diagnosis not present

## 2022-01-18 DIAGNOSIS — N179 Acute kidney failure, unspecified: Secondary | ICD-10-CM | POA: Diagnosis not present

## 2022-01-18 DIAGNOSIS — I10 Essential (primary) hypertension: Secondary | ICD-10-CM | POA: Diagnosis not present

## 2022-01-18 LAB — BASIC METABOLIC PANEL WITH GFR
BUN: 16 mg/dL (ref 7–25)
CO2: 29 mmol/L (ref 20–32)
Calcium: 9.5 mg/dL (ref 8.6–10.3)
Chloride: 103 mmol/L (ref 98–110)
Creat: 1.07 mg/dL (ref 0.70–1.28)
Glucose, Bld: 122 mg/dL — ABNORMAL HIGH (ref 65–99)
Potassium: 4.2 mmol/L (ref 3.5–5.3)
Sodium: 141 mmol/L (ref 135–146)
eGFR: 73 mL/min/{1.73_m2} (ref >=60)

## 2022-01-18 LAB — MICROALBUMIN / CREATININE URINE RATIO
Creatinine, Urine: 84 mg/dL (ref 20–320)
Microalb, Ur: <0.2 mg/dL

## 2022-01-18 MED ORDER — LISINOPRIL 20 MG PO TABS: 20.0000 mg | ORAL_TABLET | Freq: Every day | ORAL | 3 refills | Status: DC

## 2022-01-18 NOTE — Assessment & Plan Note (Signed)
Controlled A1c No hypoglycemia or hyperglycemia Complications - hyperlipidemia, OSA - increases risk of future cardiovascular complications   Plan:  - Not on medication for DM, it is diet controlled - Encourage improved lifestyle - low carb, low sugar diet, reduce portion size, continue improving regular exercise - May continue chronic case management for comprehensive nursing education on diabetes among other benefits - Continue ASA, ACEi, Statin  Future DM Eye Dr Terrilee Files

## 2022-01-18 NOTE — Patient Instructions (Addendum)
Thank you for coming to the office today.  We will check the Kidney function once more today, and hopefully get results within 24 hours. Based on the kidney function result, we can adjust your BP medications.  If the creatinine / BUN are still elevated or worse - then I would suggest HOLDING Lisinopril completely and not take for 2+ weeks, and I would suggest SWITCHING the Hydrochlorothiazide to a different BP medication Amlodipine '10mg'$  daily. Caution some swelling in lower extremity from amlodipine. If that bothers you then we will need to switch again.  If the creatinine / BUN are back to normal range for you - around 1.0 to 1.3 range then we can CONTINUE Lisinopril but maybe at a slightly lower dose this time. And we can CONSIDER switching Hydrochlorothiazide again to the Amlodipine if you are bothered by the frequent urination.  Recent Labs    01/11/22 0809  HGBA1C 6.6*   Sugar and cholesterol well controlled.   Please schedule a Follow-up Appointment to: Return in about 4 months (around 05/21/2022) for 4 month follow-up DM A1c, HTN, CKD.  If you have any other questions or concerns, please feel free to call the office or send a message through [place]. You may also schedule an earlier appointment if necessary.  Additionally, you may be receiving a survey about your experience at our office within a few days to 1 week by e-mail or mail. We value your feedback.  [name], DO [place]

## 2022-01-18 NOTE — Progress Notes (Signed)
Subjective:    Patient ID: Markus Daft., male    DOB: [birth year], 76 y.o.   MRN: [id number]  Henry Utsey. is a 76 y.o. 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 Not checking regularly now. Current Meds - Lisinopril 28m daily, HCTZ 12.574mdaily Reports good compliance, took meds today. Tolerating well, w/o complaints. - Admits mild varicose veins edema, but improved on compression stocking Denies CP, dyspnea, HA, dizziness / lightheadedness   Type 2 Diabetes A1c 6.6 CBGs Never checked. Meds: never on meds Currently on ACEi Lifestyle: - Diet (Trying to maintain improved diet, reduce carbs portions, inc water) - Exercise (Improved activity and exercise) - S/p L Great Toe Amputation Next DM Eye Exam 12/2021 Dr ChTerrilee Filesenies hypoglycemia, polyuria, visual changes, numbness or tingling.   HYPERLIPIDEMIA: - Reports no concerns. - Currently taking Rosuvastatin 1037mtolerating well without side effects or myalgias   OSA, on CPAP - Patient reports prior history of dx OSA and on CPAP for >20 years, prior to treatment his initial symptoms were snoring, daytime sleepiness and fatigue, he has had several sleep studies in the past. Most recent 4/207-May-2016ith AHI 6.8 per hour, and mild to moderate OSA. - He reports that his sleep apnea is well controlled. He uses the CPAP machine every night. He tolerates the machine well, and thinks that he sleeps better with it and feels good. No new concerns or symptoms.    PMH - Melanoma, Left big toe / History of Skin Cancer S[facility]p [facility]putation - [facility] Low Back Pain, with osteoarthritis Has seen JonVance Peper - [facility] x-rays, copied below of lumbar spine, has gradually improved, advanced OA/DJD, no other intervention. He can return if worsening.   S/p prostate  cancer Followed by Dr RaoJanese Banksd Dr ChrBaruch Goutyr Prostate Cancer Last report updated 06/27/21 with PSA still undetectable < 0.01 on therapy. He continues on Lupron injection plus apalutamide     Health Maintenance:   - UTD Pneumonia vaccine, received Pneumovax-23 at age 30 16014), and then next dose was Prevnar-13 12/2013, has received both after age [age]herefore completed     01/18/2022    9:10 AM 07/17/2021   11:08 AM 01/11/2021    9:11 AM  Depression screen PHQ 2/9  Decreased Interest 0 0 0  Down, Depressed, Hopeless 0 0 0  PHQ - 2 Score 0 0 0  Altered sleeping 0 0 0  Tired, decreased energy 1 1 1   Change in appetite 0 0 0  Feeling bad or failure about yourself  0 0 0  Trouble concentrating 0 0 0  Moving slowly or fidgety/restless 0 0 0  Suicidal thoughts 0 0   PHQ-9 Score 1 1 1   Difficult doing work/chores Not difficult at all Not difficult at all Not difficult at all    Past Medical History:  Diagnosis Date   Arthritis    H/O blood clots    H/O blood clots    in left leg in 1988   Hx of dysplastic nevus 06/18/2013   L lat ankle - mild   Melanoma ([facility]/28/2017   L prox med plantar great toe - Breslow's 1.68m88mlark level early 4   Prostate cancer (HCC)    Rash    occasionally  and pt does see a dermatologist   Sleep apnea    uses a CPAP;sleep study >31yr ago   Past Surgical History:  Procedure Laterality Date   TOE SURGERY     melanoma   TOTAL KNEE ARTHROPLASTY  2009-2010   bilateral   VIDEO ASSISTED THORACOSCOPY (VATS)/DECORTICATION Left 05/06/2012   Procedure: VIDEO ASSISTED THORACOSCOPY (VATS) drainage loculated pleural effusion;  Surgeon: EGrace Isaac MD;  [address]  Service: Thoracic;  Laterality: Left;   VIDEO BRONCHOSCOPY N/A 05/06/2012   Procedure: VIDEO BRONCHOSCOPY;  Surgeon: EGrace Isaac MD;  [facility]  Service: Thoracic;  Laterality: N/A;   Social History   Socioeconomic History   Marital status: Married    Spouse  name: Not on file   Number of children: Not on file   Years of education: tech school   Highest education level: High school graduate  Occupational History   Occupation: retired  Tobacco Use   Smoking status: Former    Packs/day: 1.00    Years: 26.00    Total pack years: 26.00    Types: Cigarettes    Quit date: 03/26/1989    Years since quitting: 32.8   Smokeless tobacco: Former   Tobacco comments:    less than 1 PPD  Vaping Use   Vaping Use: Never used  Substance and Sexual Activity   Alcohol use: No    Alcohol/week: 0.0 standard drinks of alcohol   Drug use: No   Sexual activity: Yes  Other Topics Concern   Not on file  Social History Narrative   Not on file   Social Determinants of Health   Financial Resource Strain: Low Risk  (01/07/2018)   Overall Financial Resource Strain (CARDIA)    Difficulty of Paying Living Expenses: Not hard at all  Food Insecurity: No Food Insecurity (01/07/2018)   Hunger Vital Sign    Worried About Running Out of Food in the Last Year: Never true    RCouderayin the Last Year: Never true  Transportation Needs: No Transportation Needs (01/07/2018)   PRAPARE - THydrologist(Medical): No    Lack of Transportation (Non-Medical): No  Physical Activity: Inactive (01/07/2018)   Exercise Vital Sign    Days of Exercise per Week: 0 days    Minutes of Exercise per Session: 0 min  Stress: No Stress Concern Present (01/07/2018)   FPringle   Feeling of Stress : Not at all  Social Connections: Moderately Integrated (01/07/2018)   Social Connection and Isolation Panel [NHANES]    Frequency of Communication with Friends and Family: More than three times a week    Frequency of Social Gatherings with Friends and Family: More than three times a week    Attends Religious Services: More than 4 times per year    Active Member of [employer]sor Organizations: No     Attends CArchivistMeetings: Never    Marital Status: Married  IHuman resources officerViolence: Not At Risk (01/07/2018)   Humiliation, Afraid, Rape, and Kick questionnaire    Fear of Current or Ex-Partner: No    Emotionally Abused: No    Physically Abused: No    Sexually Abused: No   Family History  Problem Relation Age of Onset   Hypertension Mother    Stroke Mother        possible   Cancer Brother    Current Outpatient  Medications on File Prior to Visit  Medication Sig   apalutamide (ERLEADA) 60 MG tablet Take 4 tablets (240 mg total) by mouth daily.   aspirin EC 81 MG tablet Take 81 mg by mouth.   atorvastatin (LIPITOR) 10 MG tablet Take 1 tablet (10 mg total) by mouth at bedtime.   hydrochlorothiazide (HYDRODIURIL) 12.5 MG tablet Take 1 tablet by mouth once daily   pimecrolimus (ELIDEL) 1 % cream APPLY   TOPICALLY TO AFFECTED AREA IN GROIN TWICE DAILY   No current facility-administered medications on file prior to visit.    Review of Systems  Constitutional:  Negative for activity change, appetite change, chills, diaphoresis, fatigue and fever.  HENT:  Negative for congestion and hearing loss.   Eyes:  Negative for visual disturbance.  Respiratory:  Negative for cough, chest tightness, shortness of breath and wheezing.   Cardiovascular:  Negative for chest pain, palpitations and leg swelling.  Gastrointestinal:  Negative for abdominal pain, constipation, diarrhea, nausea and vomiting.  Genitourinary:  Negative for dysuria, frequency and hematuria.  Musculoskeletal:  Negative for arthralgias and neck pain.  Skin:  Negative for rash.  Neurological:  Negative for dizziness, weakness, light-headedness, numbness and headaches.  Hematological:  Negative for adenopathy.  Psychiatric/Behavioral:  Negative for behavioral problems, dysphoric mood and sleep disturbance.    Per HPI unless specifically indicated above      Objective:    BP 128/72 (BP Location: Right  Arm)   Pulse 62   Ht 5' 10"  (1.778 m)   Wt 244 lb (110.7 kg)   SpO2 99%   BMI 35.01 kg/m   Wt Readings from Last 3 Encounters:  01/18/22 244 lb (110.7 kg)  10/25/21 240 lb 1.6 oz (108.9 kg)  07/17/21 241 lb 9.6 oz (109.6 kg)    Physical Exam Vitals and nursing note reviewed.  Constitutional:      General: He is not in acute distress.    Appearance: He is well-developed. He is not diaphoretic.     Comments: Well-appearing, comfortable, cooperative  HENT:     Head: Normocephalic and atraumatic.  Eyes:     General:        Right eye: No discharge.        Left eye: No discharge.     Conjunctiva/sclera: Conjunctivae normal.     Pupils: Pupils are equal, round, and reactive to light.  Neck:     Thyroid: No thyromegaly.  Cardiovascular:     Rate and Rhythm: Normal rate and regular rhythm.     Pulses: Normal pulses.     Heart sounds: Normal heart sounds. No murmur heard. Pulmonary:     Effort: Pulmonary effort is normal. No respiratory distress.     Breath sounds: Normal breath sounds. No wheezing or rales.  Abdominal:     General: Bowel sounds are normal. There is no distension.     Palpations: Abdomen is soft. There is no mass.     Tenderness: There is no abdominal tenderness.  Musculoskeletal:        General: No tenderness. Normal range of motion.     Cervical back: Normal range of motion and neck supple.     Comments: Upper / Lower Extremities: - Normal muscle tone, strength bilateral upper extremities 5/5, lower extremities 5/5  Lymphadenopathy:     Cervical: No cervical adenopathy.  Skin:    General: Skin is warm and dry.     Findings: No erythema or rash.  Neurological:     Mental  Status: He is alert and oriented to person, place, and time.     Comments: Distal sensation intact to light touch all extremities  Psychiatric:        Mood and Affect: Mood normal.        Behavior: Behavior normal.        Thought Content: Thought content normal.     Comments: Well  groomed, good eye contact, normal speech and thoughts     Diabetic Foot Exam - Simple   Simple Foot Form Diabetic Foot exam was performed with the following findings: Yes 01/18/2022  9:25 AM  Visual Inspection See comments: Yes Sensation Testing Intact to touch and monofilament testing bilaterally: Yes Pulse Check Posterior Tibialis and Dorsalis pulse intact bilaterally: Yes Comments S/p left great toe amputation well healed. Intact monofilament sensation. Varicose veins. Mild callus formation forefoot, no ulceration.      Results for orders placed or performed in visit on 18/84/16  BASIC METABOLIC PANEL WITH GFR  Result Value Ref Range   Glucose, Bld 122 (H) 65 - 99 mg/dL   BUN 16 7 - 25 mg/dL   Creat 1.07 0.70 - 1.28 mg/dL   eGFR 73 > OR = 60 mL/min/1.33m   BUN/Creatinine Ratio SEE NOTE: 6 - 22 (calc)   Sodium 141 135 - 146 mmol/L   Potassium 4.2 3.5 - 5.3 mmol/L   Chloride 103 98 - 110 mmol/L   CO2 29 20 - 32 mmol/L   Calcium 9.5 8.6 - 10.3 mg/dL      Assessment & Plan:   Problem List Items Addressed This Visit     Essential hypertension   Relevant Medications   lisinopril (ZESTRIL) 20 MG tablet   History of amputation of left great toe (HCC)   Type 2 diabetes mellitus with other specified complication (HCC)    Controlled A1c No hypoglycemia or hyperglycemia Complications - hyperlipidemia, OSA - increases risk of future cardiovascular complications   Plan:  - Not on medication for DM, it is diet controlled - Encourage improved lifestyle - low carb, low sugar diet, reduce portion size, continue improving regular exercise - May continue chronic case management for comprehensive nursing education on diabetes among other benefits - Continue ASA, ACEi, Statin  Future DM Eye Dr CTerrilee Files     Relevant Medications   lisinopril (ZESTRIL) 20 MG tablet   Other Relevant Orders   Urine Microalbumin w/creat. ratio   Other Visit Diagnoses     Annual physical  exam    -  Primary   Need for immunization against influenza       Relevant Orders   Flu Vaccine QUAD High Dose(Fluad) (Completed)   Elevated serum creatinine       Relevant Orders   BASIC METABOLIC PANEL WITH GFR (Completed)   AKI (acute kidney injury) ([place]       Relevant Orders   BASIC METABOLIC PANEL WITH GFR (Completed)       Updated Health Maintenance information Reviewed recent lab results with patient Encouraged improvement to lifestyle with diet and exercise Goal of weight loss  AoCKD / AKI Creatinine has improved significantly since prior elevation in August 2023 1.75 > 1.41 now to 1.07 back to baseline Encourage continued hydration Lower dose Lisinopril from 358mdown to 2018montinue HCTZ for now, he does have edema. We can consider reduce or DC due to urinary frequency  Orders Placed This Encounter  Procedures   Flu Vaccine QUAD High Dose(Fluad)   BASIC METABOLIC  PANEL WITH GFR   Urine Microalbumin w/creat. ratio     Meds ordered this encounter  Medications   lisinopril (ZESTRIL) 20 MG tablet    Sig: Take 1 tablet (20 mg total) by mouth daily.    Dispense:  90 tablet    Refill:  3    Stop Lisinopril 23m and reduce to Lisinopril 229mdaily      Follow up plan: Return in about 4 months (around 05/21/2022) for 4 month follow-up DM A1c, HTN, CKD.  AlNobie PutnamDOBethanyedical Group 01/18/2022, 9:10 AM

## 2022-01-22 ENCOUNTER — Other Ambulatory Visit: Payer: Self-pay | Admitting: *Deleted

## 2022-01-22 ENCOUNTER — Other Ambulatory Visit (HOSPITAL_COMMUNITY): Payer: Self-pay

## 2022-01-22 DIAGNOSIS — Z79899 Other long term (current) drug therapy: Secondary | ICD-10-CM

## 2022-01-22 DIAGNOSIS — C61 Malignant neoplasm of prostate: Secondary | ICD-10-CM

## 2022-01-22 LAB — HM DIABETES EYE EXAM

## 2022-01-22 NOTE — Telephone Encounter (Signed)
Oral Oncology Patient Advocate Encounter   Submitted application for assistance for Erleada to [employer].   Application submitted via e-fax to [phone]   Janssen's phone number [phone].   I will continue to check the status until final determination.   Berdine Addison, [facility] Patient [place]  [phone] (phone) [phone] (fax) 01/22/2022 3:31 PM

## 2022-01-23 ENCOUNTER — Encounter: Payer: Self-pay | Admitting: Pharmacist

## 2022-01-23 NOTE — Progress Notes (Signed)
Oral Chemotherapy Pharmacist Encounter   Dispensed samples to patient:   Medication: Erleada '60mg'$  tablets Instructions: Take 4 tablets (240 mg total) by mouth daily. Quantity dispensed: 56 tablets Days supply: 14 days Manufacturer: [employer] Lot: [id number] Exp: 08/23/21  Patient plans on picking up samples from the office on 01/24/22.   Darl Pikes, PharmD, BCPS, BCOP, CPP Hematology/Oncology Clinical Pharmacist Practitioner [place]/[facility]/AP [facility] [phone]  01/23/2022 9:35 AM

## 2022-01-24 ENCOUNTER — Inpatient Hospital Stay: Payer: PPO

## 2022-01-24 ENCOUNTER — Other Ambulatory Visit: Payer: Self-pay

## 2022-01-24 ENCOUNTER — Inpatient Hospital Stay: Payer: PPO | Attending: Oncology | Admitting: Oncology

## 2022-01-24 ENCOUNTER — Encounter: Payer: Self-pay | Admitting: Oncology

## 2022-01-24 VITALS — BP 121/82 | HR 77 | Temp 98.2°F | Resp 17 | Wt 244.0 lb

## 2022-01-24 DIAGNOSIS — C61 Malignant neoplasm of prostate: Secondary | ICD-10-CM

## 2022-01-24 DIAGNOSIS — Z79899 Other long term (current) drug therapy: Secondary | ICD-10-CM | POA: Insufficient documentation

## 2022-01-24 LAB — CBC WITH DIFFERENTIAL/PLATELET
Abs Immature Granulocytes: 0.03 10*3/uL (ref 0.00–0.07)
Basophils Absolute: 0 10*3/uL (ref 0.0–0.1)
Basophils Relative: 1 %
Eosinophils Absolute: 0.3 10*3/uL (ref 0.0–0.5)
Eosinophils Relative: 5 %
HCT: 40.1 % (ref 39.0–52.0)
Hemoglobin: 13.4 g/dL (ref 13.0–17.0)
Immature Granulocytes: 0 %
Lymphocytes Relative: 21 %
Lymphs Abs: 1.5 10*3/uL (ref 0.7–4.0)
MCH: 30.1 pg (ref 26.0–34.0)
MCHC: 33.4 g/dL (ref 30.0–36.0)
MCV: 90.1 fL (ref 80.0–100.0)
Monocytes Absolute: 0.4 10*3/uL (ref 0.1–1.0)
Monocytes Relative: 6 %
Neutro Abs: 4.6 10*3/uL (ref 1.7–7.7)
Neutrophils Relative %: 67 %
Platelets: 232 10*3/uL (ref 150–400)
RBC: 4.45 MIL/uL (ref 4.22–5.81)
RDW: 12.5 % (ref 11.5–15.5)
WBC: 6.9 10*3/uL (ref 4.0–10.5)
nRBC: 0 % (ref 0.0–0.2)

## 2022-01-24 LAB — COMPREHENSIVE METABOLIC PANEL
ALT: 12 U/L (ref 0–44)
AST: 18 U/L (ref 15–41)
Albumin: 4.1 g/dL (ref 3.5–5.0)
Alkaline Phosphatase: 70 U/L (ref 38–126)
Anion gap: 10 (ref 5–15)
BUN: 19 mg/dL (ref 8–23)
CO2: 26 mmol/L (ref 22–32)
Calcium: 9.3 mg/dL (ref 8.9–10.3)
Chloride: 102 mmol/L (ref 98–111)
Creatinine, Ser: 0.87 mg/dL (ref 0.61–1.24)
GFR, Estimated: >60 mL/min (ref >60)
Glucose, Bld: 163 mg/dL — ABNORMAL HIGH (ref 70–99)
Potassium: 3.9 mmol/L (ref 3.5–5.1)
Sodium: 138 mmol/L (ref 135–145)
Total Bilirubin: <0.1 mg/dL — ABNORMAL LOW (ref 0.3–1.2)
Total Protein: 7.4 g/dL (ref 6.5–8.1)

## 2022-01-24 LAB — PSA: Prostatic Specific Antigen: <0.01 ng/mL (ref 0.00–4.00)

## 2022-01-24 MED ORDER — APALUTAMIDE 60 MG PO TABS: 240.0000 mg | ORAL_TABLET | Freq: Every day | ORAL | 5 refills | Status: DC

## 2022-01-24 NOTE — Progress Notes (Signed)
Patient here for oncology follow-up appointment, expresses no new concerns at this time.    

## 2022-01-24 NOTE — Progress Notes (Signed)
Hematology/Oncology Consult note [facility]  [phone] [phone]  Patient Care Team: Olin Hauser, DO as PCP - General (Family Medicine) Luan Pulling Ronelle Nigh., MD (Inactive) (Unknown Physician Specialty) Grace Isaac, MD (Inactive) as Consulting Physician (Cardiothoracic Surgery) Clance, Armando Reichert, MD as Consulting Physician (Pulmonary Disease) Grace Isaac, MD (Inactive) as Consulting Physician (Cardiothoracic Surgery) Clance, Armando Reichert, MD as Consulting Physician (Pulmonary Disease) Minor, Dalbert Garnet, RN (Inactive) as Case Manager Sindy Guadeloupe, MD as Consulting Physician (Hematology and Oncology)   Name of the patient: Eric Stone  [id number]  Jul 29, 1945   Date of visit: 01/24/22  Diagnosis- castrate resistant nonmetastatic prostate cancer  Chief complaint/ Reason for visit-routine follow-up of prostate cancer on Lupron and apalutamide  Heme/Onc history: patient is a 76 year old male who was diagnosed with Gleason 6 adenocarcinoma of the prostateAbout 5 years ago and received radiation treatment.  He has not undergone the surgery for his prostate but has seen Dr. Bernardo Heater in the past.  He was receiving intermittent ADT and last received 45 mg of Eligard on 09/30/2018.  His PSA following that went down to 0.87 December 2020.  He did not receive any ADT after that and his most recent PSA on 09/24/2019 was elevated at 5.08.  Patient reports having significant hot flashes and fatigue when he gets ADT.    Patient presently on continuous ADT since July 2021 which she gets every 6 months.  Bone scan did not show any evidence of metastatic disease.   Patient noted to have PSA doubling time of about 3 months in August 2022.  CT abdomen and bone scan did not show any evidence of distant metastatic disease.Patient started on apalutamide for castrate resistant nonmetastatic prostate cancer      Interval history-patient ran out of  his apalutamide yesterday and is awaiting his new prescription.  Tolerating his drug well so far without any significant side effects.  Occasional self-limited hot flashes.  ECOG PS- 1 Pain scale- 0   Review of systems- Review of Systems  Constitutional:  Negative for chills, fever, malaise/fatigue and weight loss.  HENT:  Negative for congestion, ear discharge and nosebleeds.   Eyes:  Negative for blurred vision.  Respiratory:  Negative for cough, hemoptysis, sputum production, shortness of breath and wheezing.   Cardiovascular:  Negative for chest pain, palpitations, orthopnea and claudication.  Gastrointestinal:  Negative for abdominal pain, blood in stool, constipation, diarrhea, heartburn, melena, nausea and vomiting.  Genitourinary:  Negative for dysuria, flank pain, frequency, hematuria and urgency.  Musculoskeletal:  Negative for back pain, joint pain and myalgias.  Skin:  Negative for rash.  Neurological:  Negative for dizziness, tingling, focal weakness, seizures, weakness and headaches.  Endo/Heme/Allergies:  Does not bruise/bleed easily.  Psychiatric/Behavioral:  Negative for depression and suicidal ideas. The patient does not have insomnia.       Allergies  Allergen Reactions   Penicillins Other (See Comments)    As a child     Past Medical History:  Diagnosis Date   Arthritis    H/O blood clots    H/O blood clots    in left leg in 1988   Hx of dysplastic nevus 06/18/2013   L lat ankle - mild   Melanoma ([place]) 02/21/2016   L prox med plantar great toe - Breslow's 1.22m, Clark level early 4   Prostate cancer (HCC)    Rash    occasionally and pt does see a dermatologist  Sleep apnea    uses a CPAP;sleep study >22yr ago     Past Surgical History:  Procedure Laterality Date   TOE SURGERY     melanoma   TOTAL KNEE ARTHROPLASTY  2009-2010   bilateral   VIDEO ASSISTED THORACOSCOPY (VATS)/DECORTICATION Left 05/06/2012   Procedure: VIDEO ASSISTED THORACOSCOPY  (VATS) drainage loculated pleural effusion;  Surgeon: EGrace Isaac MD;  [facility]  Service: Thoracic;  Laterality: Left;   VIDEO BRONCHOSCOPY N/A 05/06/2012   Procedure: VIDEO BRONCHOSCOPY;  Surgeon: EGrace Isaac MD;  [facility]  Service: [facility];  Laterality: N/A;    Social History   Socioeconomic History   Marital status: Married    Spouse name: Not on file   Number of children: Not on file   Years of education: tech school   Highest education level: High school graduate  Occupational History   Occupation: retired  Tobacco Use   Smoking status: Former    Packs/day: 1.00    Years: 26.00    Total pack years: 26.00    Types: Cigarettes    Quit date: 03/26/1989    Years since quitting: 32.8   Smokeless tobacco: Former   Tobacco comments:    less than 1 PPD  Vaping Use   Vaping Use: Never used  Substance and Sexual Activity   Alcohol use: No    Alcohol/week: 0.0 standard drinks of alcohol   Drug use: No   Sexual activity: Yes  Other Topics Concern   Not on file  Social History Narrative   Not on file   Social Determinants of Health   Financial Resource Strain: Low Risk  (01/07/2018)   Overall Financial Resource Strain (CARDIA)    Difficulty of Paying Living Expenses: Not hard at all  Food Insecurity: No Food Insecurity (01/07/2018)   Hunger Vital Sign    Worried About Running Out of Food in the Last Year: Never true    RVarnain the Last Year: Never true  Transportation Needs: No Transportation Needs (01/07/2018)   PRAPARE - THydrologist(Medical): No    Lack of Transportation (Non-Medical): No  Physical Activity: Inactive (01/07/2018)   Exercise Vital Sign    Days of Exercise per Week: 0 days    Minutes of Exercise per Session: 0 min  Stress: No Stress Concern Present (01/07/2018)   FWatson   Feeling of Stress : Not at all  Social  Connections: Moderately Integrated (01/07/2018)   Social Connection and Isolation Panel [NHANES]    Frequency of Communication with Friends and Family: More than three times a week    Frequency of Social Gatherings with Friends and Family: More than three times a week    Attends Religious Services: More than 4 times per year    Active Member of [employer] Organizations: No    Attends CArchivistMeetings: Never    Marital Status: Married  IHuman resources officerViolence: Not At Risk (01/07/2018)   Humiliation, Afraid, Rape, and Kick questionnaire    Fear of Current or Ex-Partner: No    Emotionally Abused: No    Physically Abused: No    Sexually Abused: No    Family History  Problem Relation Age of Onset   Hypertension Mother    Stroke Mother        possible   Cancer Brother      Current Outpatient Medications:  aspirin EC 81 MG tablet, Take 81 mg by mouth., Disp: , Rfl:    atorvastatin (LIPITOR) 10 MG tablet, Take 1 tablet (10 mg total) by mouth at bedtime., Disp: 90 tablet, Rfl: 3   hydrochlorothiazide (HYDRODIURIL) 12.5 MG tablet, Take 1 tablet by mouth once daily, Disp: 90 tablet, Rfl: 1   lisinopril (ZESTRIL) 20 MG tablet, Take 1 tablet (20 mg total) by mouth daily., Disp: 90 tablet, Rfl: 3   pimecrolimus (ELIDEL) 1 % cream, APPLY   TOPICALLY TO AFFECTED AREA IN GROIN TWICE DAILY, Disp: 60 g, Rfl: 0   apalutamide (ERLEADA) 60 MG tablet, Take 4 tablets (240 mg total) by mouth daily., Disp: 120 tablet, Rfl: 5  Physical exam:  Vitals:   01/24/22 1429  BP: 121/82  Pulse: 77  Resp: 17  Temp: 98.2 F (36.8 C)  TempSrc: Oral  SpO2: 97%  Weight: 244 lb (110.7 kg)   Physical Exam Constitutional:      General: He is not in acute distress. Cardiovascular:     Rate and Rhythm: Normal rate and regular rhythm.     Heart sounds: Normal heart sounds.  Pulmonary:     Effort: Pulmonary effort is normal.     Breath sounds: Normal breath sounds.  Abdominal:     General:  Bowel sounds are normal.     Palpations: Abdomen is soft.  Skin:    General: Skin is warm and dry.  Neurological:     Mental Status: He is alert and oriented to person, place, and time.         Latest Ref Rng & Units 01/24/2022    2:02 PM  CMP  Glucose 70 - 99 mg/dL 163   BUN 8 - 23 mg/dL 19   Creatinine 0.61 - 1.24 mg/dL 0.87   Sodium 135 - 145 mmol/L 138   Potassium 3.5 - 5.1 mmol/L 3.9   Chloride 98 - 111 mmol/L 102   CO2 22 - 32 mmol/L 26   Calcium 8.9 - 10.3 mg/dL 9.3   Total Protein 6.5 - 8.1 g/dL 7.4   Total Bilirubin 0.3 - 1.2 mg/dL <0.1   Alkaline Phos 38 - 126 U/L 70   AST 15 - 41 U/L 18   ALT 0 - 44 U/L 12       Latest Ref Rng & Units 01/24/2022    2:02 PM  CBC  WBC 4.0 - 10.5 K/uL 6.9   Hemoglobin 13.0 - 17.0 g/dL 13.4   Hematocrit 39.0 - 52.0 % 40.1   Platelets 150 - 400 K/uL 232     No images are attached to the encounter.  No results found.   Assessment and plan- Patient is a 77 y.o. male with history of nonmetastatic castrate resistant prostate cancer currently on ADT plus apalutamide here for routine follow-up  We are in the process of reapplying for his grant for apalutamide.  We did give him monthly sample pack until we can get the grant approved so that he can continue with his apalutamide uninterrupted.  Overall he has responded to treatment well andPSA continues to be undetectable at this time.  PSA from today is pending.  I will see him back in 3 months time and he will receive Lupron at that time with labs CBC with differential CMP and PSA   Visit Diagnosis 1. Malignant neoplasm of prostate ([place])   2. High risk medication use      Dr. Randa Evens, MD, MPH [facility]  [facility] [phone] 01/24/2022 5:48 PM

## 2022-01-30 ENCOUNTER — Other Ambulatory Visit (HOSPITAL_COMMUNITY): Payer: Self-pay

## 2022-01-30 NOTE — Telephone Encounter (Signed)
Called Eric Stone to check status of application. Informed it was processing and that they needed billing claim info. Faxed over to them to finish processing.   Eric Stone, [facility] [name]  [phone] (phone) [phone] (fax) 01/30/2022 10:57 AM

## 2022-02-02 NOTE — Telephone Encounter (Signed)
Oral Oncology Patient Advocate Encounter   Received notification that the application for assistance for Erleada through [employer] has been approved.   Jannsen's phone number [phone].   Effective dates: 11.10.23 through 12.31.24  I have spoken to the patient.  Berdine Addison, [facility] Patient [place]  [phone] (phone) [phone] (fax) 02/02/2022 3:13 PM

## 2022-02-02 NOTE — Telephone Encounter (Addendum)
Called [name] to check status. Confirmed they did receive the copy of the claim requested and that it was back in processing.    Eric Stone, [facility] Patient Eric Stone  [phone] (phone) [phone] (fax) 02/02/2022 11:36 AM

## 2022-02-07 ENCOUNTER — Other Ambulatory Visit (HOSPITAL_COMMUNITY): Payer: Self-pay

## 2022-02-07 DIAGNOSIS — G4733 Obstructive sleep apnea (adult) (pediatric): Secondary | ICD-10-CM | POA: Diagnosis not present

## 2022-03-08 ENCOUNTER — Encounter: Payer: Self-pay | Admitting: Internal Medicine

## 2022-03-08 ENCOUNTER — Ambulatory Visit (INDEPENDENT_AMBULATORY_CARE_PROVIDER_SITE_OTHER): Payer: PPO | Admitting: Internal Medicine

## 2022-03-08 ENCOUNTER — Ambulatory Visit: Payer: Self-pay

## 2022-03-08 VITALS — BP 118/62 | HR 82 | Temp 96.8°F | Wt 246.0 lb

## 2022-03-08 DIAGNOSIS — Z20828 Contact with and (suspected) exposure to other viral communicable diseases: Secondary | ICD-10-CM

## 2022-03-08 DIAGNOSIS — J069 Acute upper respiratory infection, unspecified: Secondary | ICD-10-CM

## 2022-03-08 MED ORDER — OSELTAMIVIR PHOSPHATE 75 MG PO CAPS: 75.0000 mg | ORAL_CAPSULE | Freq: Every day | ORAL | 0 refills | Status: DC

## 2022-03-08 MED ORDER — BENZONATATE 200 MG PO CAPS: 200.0000 mg | ORAL_CAPSULE | Freq: Three times a day (TID) | ORAL | 0 refills | Status: DC | PRN

## 2022-03-08 NOTE — Telephone Encounter (Signed)
Pt stated experiencing a cough, runny nose and headache. Pt mentioned a low-grade fever yesterday and is requesting an appointment; no appointments are available.      Chief Complaint: Cough, runny nose,wife has flu Symptoms: Above Frequency: Last night Pertinent Negatives: Patient denies SOB Disposition: '[]'$ ED /'[]'$ Urgent Care (no appt availability in office) / '[x]'$ Appointment(In office/virtual)/ '[]'$  [place] [facility]/ '[]'$ Home Care/ '[]'$ Refused Recommended Disposition /'[]'$ [place] [facility]/ '[]'$  Follow-up with PCP Additional Notes:   Reason for Disposition  [1] Continuous (nonstop) coughing interferes with work or school AND [2] no improvement using cough treatment per Care Advice  Answer Assessment - Initial Assessment Questions 1. ONSET: "When did the cough begin?"      Last night 2. SEVERITY: "How bad is the cough today?"      Severe 3. SPUTUM: "Describe the color of your sputum" (none, dry cough; clear, white, yellow, green)     None 4. HEMOPTYSIS: "Are you coughing up any blood?" If so ask: "How much?" (flecks, streaks, tablespoons, etc.)     No 5. DIFFICULTY BREATHING: "Are you having difficulty breathing?" If Yes, ask: "How bad is it?" (e.g., mild, moderate, severe)    - MILD: No SOB at rest, mild SOB with walking, speaks normally in sentences, can lie down, no retractions, pulse < 100.    - MODERATE: SOB at rest, SOB with minimal exertion and prefers to sit, cannot lie down flat, speaks in phrases, mild retractions, audible wheezing, pulse 100-120.    - SEVERE: Very SOB at rest, speaks in single words, struggling to breathe, sitting hunched forward, retractions, pulse > 120      No 6. FEVER: "Do you have a fever?" If Yes, ask: "What is your temperature, how was it measured, and when did it start?"     Low grade 7. CARDIAC HISTORY: "Do you have any history of heart disease?" (e.g., heart attack, congestive heart failure)      No  8. LUNG HISTORY: "Do you have any history of  lung disease?"  (e.g., pulmonary embolus, asthma, emphysema)     No 9. PE RISK FACTORS: "Do you have a history of blood clots?" (or: recent major surgery, recent prolonged travel, bedridden)     No 10. OTHER SYMPTOMS: "Do you have any other symptoms?" (e.g., runny nose, wheezing, chest pain)       Runny nose 11. PREGNANCY: "Is there any chance you are pregnant?" "When was your last menstrual period?"       N/a 12. TRAVEL: "Have you traveled out of the country in the last month?" (e.g., travel history, exposures)       No  Protocols used: Cough - Acute Non-Productive-A-AH

## 2022-03-08 NOTE — Patient Instructions (Signed)

## 2022-03-08 NOTE — Progress Notes (Signed)
Subjective:    Patient ID: Eric Daft., male    DOB: 1945-06-16, 76 y.o.   MRN: [id number]  HPI  Patient presents to clinic today with complaint of  headache, runny nose, sore throat and cough.  This started yesterday.  The headache is located in his forehead. He describes the pain as pressure. He is blowing clear mucous of his nose. He denies difficulty swallowing. The cough is non productive.  He denies ear pain, shortness of breath, chest pain, nausea, vomiting or diarrhea.  He denies fever, chills or body aches.  He has not tried any medication OTC for this.  He reports his wife was recently diagnosed with the flu.  Review of Systems     Past Medical History:  Diagnosis Date   Arthritis    H/O blood clots    H/O blood clots    in left leg in 1988   Hx of dysplastic nevus 06/18/2013   L lat ankle - mild   Melanoma [place]) 02/21/2016   L prox med plantar great toe - Breslow's 1.64m, Clark level early 4   [place]    Rash    occasionally and pt does see a dermatologist   Sleep apnea    uses a CPAP;sleep study >511yrago    Current Outpatient Medications  Medication Sig Dispense Refill   apalutamide (ERLEADA) 60 MG tablet Take 4 tablets (240 mg total) by mouth daily. 120 tablet 5   aspirin EC 81 MG tablet Take 81 mg by mouth.     atorvastatin (LIPITOR) 10 MG tablet Take 1 tablet (10 mg total) by mouth at bedtime. 90 tablet 3   hydrochlorothiazide (HYDRODIURIL) 12.5 MG tablet Take 1 tablet by mouth once daily 90 tablet 1   lisinopril (ZESTRIL) 20 MG tablet Take 1 tablet (20 mg total) by mouth daily. 90 tablet 3   pimecrolimus (ELIDEL) 1 % cream APPLY   TOPICALLY TO AFFECTED AREA IN GROIN TWICE DAILY 60 g 0   No current facility-administered medications for this visit.    Allergies  Allergen Reactions   Penicillins Other (See Comments)    As a child    Family History  Problem Relation Age of Onset   Hypertension Mother    Stroke Mother         possible   Cancer Brother     Social History   Socioeconomic History   Marital status: Married    Spouse name: Not on file   Number of children: Not on file   Years of education: tech school   Highest education level: High school graduate  Occupational History   Occupation: retired  Tobacco Use   Smoking status: Former    Packs/day: 1.00    Years: 26.00    Total pack years: 26.00    Types: Cigarettes    Quit date: 03/26/1989    Years since quitting: 32.9   Smokeless tobacco: Former   Tobacco comments:    less than 1 PPD  Vaping Use   Vaping Use: Never used  Substance and Sexual Activity   Alcohol use: No    Alcohol/week: 0.0 standard drinks of alcohol   Drug use: No   Sexual activity: Yes  Other Topics Concern   Not on file  Social History Narrative   Not on file   Social Determinants of Health   Financial Resource Strain: Low Risk  (01/07/2018)   Overall Financial Resource Strain (CARDIA)    Difficulty of  Paying Living Expenses: Not hard at all  Food Insecurity: No Food Insecurity (01/07/2018)   Hunger Vital Sign    Worried About Running Out of Food in the Last Year: Never true    Ran Out of Food in the Last Year: Never true  Transportation Needs: No Transportation Needs (01/07/2018)   PRAPARE - Hydrologist (Medical): No    Lack of Transportation (Non-Medical): No  Physical Activity: Inactive (01/07/2018)   Exercise Vital Sign    Days of Exercise per Week: 0 days    Minutes of Exercise per Session: 0 min  Stress: No Stress Concern Present (01/07/2018)   [place]    Feeling of Stress : Not at all  Social Connections: Moderately Integrated (01/07/2018)   Social Connection and Isolation Panel [NHANES]    Frequency of Communication with Friends and Family: More than three times a week    Frequency of Social Gatherings with Friends and Family: More than three times a  week    Attends Religious Services: More than 4 times per year    Active Member of [employer] or Organizations: No    Attends Archivist Meetings: Never    Marital Status: Married  Human resources officer Violence: Not At Risk (01/07/2018)   Humiliation, Afraid, Rape, and Kick questionnaire    Fear of Current or Ex-Partner: No    Emotionally Abused: No    Physically Abused: No    Sexually Abused: No     Constitutional: Patient reports headache.  Denies fever, malaise, fatigue, or abrupt weight changes.  HEENT: Patient reports runny nose and sore throat.  Denies eye pain, eye redness, ear pain, ringing in the ears, wax buildup, nasal congestion, bloody nose. Respiratory: Patient reports cough.  Denies difficulty breathing, shortness of breath, or sputum production.   Cardiovascular: Denies chest pain, chest tightness, palpitations or swelling in the hands or feet.  Gastrointestinal: Denies abdominal pain, bloating, constipation, diarrhea or blood in the stool.   No other specific complaints in a complete review of systems (except as listed in HPI above).  Objective:   Physical Exam   BP 118/62 (BP Location: Right Arm, Patient Position: Sitting, Cuff Size: Large)   Pulse 82   Temp (!) 96.8 F (36 C) (Temporal)   Wt 246 lb (111.6 kg)   SpO2 99%   BMI 35.30 kg/m   Wt Readings from Last 3 Encounters:  01/24/22 244 lb (110.7 kg)  01/18/22 244 lb (110.7 kg)  10/25/21 240 lb 1.6 oz (108.9 kg)    General: Appears his stated age, obese, in NAD. Skin: Warm, dry and intact. No rashes noted. HEENT: Head: normal shape and size, no sinus tenderness noted; Eyes: sclera white, no icterus, conjunctiva pink, PERRLA and EOMs intact; Nose: mucosa pink and moist, septum midline; Throat/Mouth: Teeth present, mucosa pink and moist, no exudate, lesions or ulcerations noted.  Neck: No adenopathy noted. Cardiovascular: Normal rate and rhythm. S1,S2 noted.  No murmur, rubs or gallops noted.   Pulmonary/Chest: Normal effort and positive vesicular breath sounds. No respiratory distress. No wheezes, rales or ronchi noted.  Musculoskeletal:  No difficulty with gait.  Neurological: Alert and oriented.   BMET    Component Value Date/Time   NA 138 01/24/2022 1402   NA 143 07/12/2015 0910   NA 137 04/17/2012 0543   K 3.9 01/24/2022 1402   K 3.6 04/17/2012 0543   CL 102 01/24/2022 1402  CL 101 04/17/2012 0543   CO2 26 01/24/2022 1402   CO2 26 04/17/2012 0543   GLUCOSE 163 (H) 01/24/2022 1402   GLUCOSE 148 (H) 04/17/2012 0543   BUN 19 01/24/2022 1402   BUN 16 07/12/2015 0910   BUN 25 (H) 04/17/2012 0543   CREATININE 0.87 01/24/2022 1402   CREATININE 1.07 01/18/2022 0936   CALCIUM 9.3 01/24/2022 1402   CALCIUM 8.6 04/17/2012 0543   GFRNONAA >60 01/24/2022 1402   GFRNONAA 73 01/05/2020 0748   GFRAA 85 01/05/2020 0748    Lipid Panel     Component Value Date/Time   CHOL 155 01/11/2022 0809   CHOL 156 07/12/2015 0910   TRIG 120 01/11/2022 0809   HDL 48 01/11/2022 0809   HDL 40 07/12/2015 0910   CHOLHDL 3.2 01/11/2022 0809   LDLCALC 85 01/11/2022 0809    CBC    Component Value Date/Time   WBC 6.9 01/24/2022 1402   RBC 4.45 01/24/2022 1402   HGB 13.4 01/24/2022 1402   HGB 15.0 07/12/2015 0910   HCT 40.1 01/24/2022 1402   HCT 43.3 07/12/2015 0910   PLT 232 01/24/2022 1402   PLT 204 07/12/2015 0910   MCV 90.1 01/24/2022 1402   MCV 88 07/12/2015 0910   MCV 91 01/14/2014 1357   MCH 30.1 01/24/2022 1402   MCHC 33.4 01/24/2022 1402   RDW 12.5 01/24/2022 1402   RDW 13.8 07/12/2015 0910   RDW 13.3 01/14/2014 1357   LYMPHSABS 1.5 01/24/2022 1402   LYMPHSABS 1.2 07/12/2015 0910   LYMPHSABS 0.7 (L) 01/14/2014 1357   MONOABS 0.4 01/24/2022 1402   MONOABS 0.5 01/14/2014 1357   EOSABS 0.3 01/24/2022 1402   EOSABS 0.2 07/12/2015 0910   EOSABS 0.2 01/14/2014 1357   BASOSABS 0.0 01/24/2022 1402   BASOSABS 0.0 07/12/2015 0910   BASOSABS 0.0 01/14/2014 1357    Hgb  A1C Lab Results  Component Value Date   HGBA1C 6.6 (H) 01/11/2022           Assessment & Plan:   Exposure to Influenza, Viral URI with Cough:  Encourage rest and fluids Rapid flu negative Rapid COVID-negative Rx for Tamiflu 75 mg daily x 10 days Rx for Tessalon 200 mg every 8 hours as needed for cough Recommend he start Zyrtec and Flonase OTC daily x 1 week   Follow-up with your PCP as previously scheduled Webb Silversmith, NP

## 2022-04-06 ENCOUNTER — Other Ambulatory Visit (HOSPITAL_COMMUNITY): Payer: Self-pay

## 2022-04-27 ENCOUNTER — Encounter: Payer: Self-pay | Admitting: Oncology

## 2022-04-27 ENCOUNTER — Inpatient Hospital Stay: Payer: PPO | Attending: Oncology

## 2022-04-27 ENCOUNTER — Inpatient Hospital Stay (HOSPITAL_BASED_OUTPATIENT_CLINIC_OR_DEPARTMENT_OTHER): Payer: PPO | Admitting: Oncology

## 2022-04-27 ENCOUNTER — Inpatient Hospital Stay: Payer: PPO

## 2022-04-27 VITALS — BP 125/73 | HR 75 | Temp 97.3°F | Resp 18 | Wt 242.7 lb

## 2022-04-27 DIAGNOSIS — Z79899 Other long term (current) drug therapy: Secondary | ICD-10-CM

## 2022-04-27 DIAGNOSIS — C61 Malignant neoplasm of prostate: Secondary | ICD-10-CM

## 2022-04-27 DIAGNOSIS — Z5111 Encounter for antineoplastic chemotherapy: Secondary | ICD-10-CM | POA: Insufficient documentation

## 2022-04-27 LAB — CBC WITH DIFFERENTIAL/PLATELET
Abs Immature Granulocytes: 0.01 10*3/uL (ref 0.00–0.07)
Basophils Absolute: 0 10*3/uL (ref 0.0–0.1)
Basophils Relative: 1 %
Eosinophils Absolute: 0.2 10*3/uL (ref 0.0–0.5)
Eosinophils Relative: 3 %
HCT: 37.9 % — ABNORMAL LOW (ref 39.0–52.0)
Hemoglobin: 13 g/dL (ref 13.0–17.0)
Immature Granulocytes: 0 %
Lymphocytes Relative: 18 %
Lymphs Abs: 1.3 10*3/uL (ref 0.7–4.0)
MCH: 29.6 pg (ref 26.0–34.0)
MCHC: 34.3 g/dL (ref 30.0–36.0)
MCV: 86.3 fL (ref 80.0–100.0)
Monocytes Absolute: 0.7 10*3/uL (ref 0.1–1.0)
Monocytes Relative: 9 %
Neutro Abs: 4.9 10*3/uL (ref 1.7–7.7)
Neutrophils Relative %: 69 %
Platelets: 245 10*3/uL (ref 150–400)
RBC: 4.39 MIL/uL (ref 4.22–5.81)
RDW: 13.1 % (ref 11.5–15.5)
WBC: 7.2 10*3/uL (ref 4.0–10.5)
nRBC: 0 % (ref 0.0–0.2)

## 2022-04-27 LAB — COMPREHENSIVE METABOLIC PANEL
ALT: 12 U/L (ref 0–44)
AST: 18 U/L (ref 15–41)
Albumin: 4 g/dL (ref 3.5–5.0)
Alkaline Phosphatase: 60 U/L (ref 38–126)
Anion gap: 10 (ref 5–15)
BUN: 22 mg/dL (ref 8–23)
CO2: 25 mmol/L (ref 22–32)
Calcium: 9.2 mg/dL (ref 8.9–10.3)
Chloride: 103 mmol/L (ref 98–111)
Creatinine, Ser: 1.12 mg/dL (ref 0.61–1.24)
GFR, Estimated: >60 mL/min (ref >60)
Glucose, Bld: 123 mg/dL — ABNORMAL HIGH (ref 70–99)
Potassium: 3.8 mmol/L (ref 3.5–5.1)
Sodium: 138 mmol/L (ref 135–145)
Total Bilirubin: 0.2 mg/dL — ABNORMAL LOW (ref 0.3–1.2)
Total Protein: 7.1 g/dL (ref 6.5–8.1)

## 2022-04-27 LAB — PSA: Prostatic Specific Antigen: <0.01 ng/mL (ref 0.00–4.00)

## 2022-04-27 MED ORDER — LEUPROLIDE ACETATE (6 MONTH) 45 MG ~~LOC~~ KIT
45.0000 mg | PACK | Freq: Once | SUBCUTANEOUS | Status: AC
  Administered 2022-04-27: 45 mg via SUBCUTANEOUS
  Filled 2022-04-27: qty 45

## 2022-04-27 NOTE — Progress Notes (Signed)
Patient here today for follow up regarding prostate cancer. Patient continues to take [facility], reports he is tolerating well. Patient reports fatigue and urinary frequency.

## 2022-04-28 ENCOUNTER — Encounter: Payer: Self-pay | Admitting: Oncology

## 2022-04-28 NOTE — Progress Notes (Signed)
Hematology/Oncology Consult note [facility]  [phone] [phone]  Patient Care Team: Olin Hauser, DO as PCP - General (Family Medicine) Luan Pulling Ronelle Nigh., MD (Inactive) (Unknown Physician Specialty) Grace Isaac, MD (Inactive) as Consulting Physician (Cardiothoracic Surgery) Clance, Armando Reichert, MD as Consulting Physician (Pulmonary Disease) Grace Isaac, MD (Inactive) as Consulting Physician (Cardiothoracic Surgery) Clance, Armando Reichert, MD as Consulting Physician (Pulmonary Disease) Minor, Dalbert Garnet, RN (Inactive) as Case Manager Sindy Guadeloupe, MD as Consulting Physician (Hematology and Oncology)   Name of the patient: Eric Stone  [id number]  [birth year]   Date of visit: 04/28/22  Diagnosis- castrate resistant nonmetastatic prostate cancer   Chief complaint/ Reason for visit-routine follow-up of prostate cancer on Lupron and apalutamide  Heme/Onc history: patient is a 77 year old male who was diagnosed with Gleason 6 adenocarcinoma of the prostateAbout 5 years ago and received radiation treatment.  He has not undergone the surgery for his prostate but has seen Dr. Bernardo Heater in the past.  He was receiving intermittent ADT and last received 45 mg of Eligard on 09/30/2018.  His PSA following that went down to 0.87 December 2020.  He did not receive any ADT after that and his most recent PSA on 09/24/2019 was elevated at 5.08.  Patient reports having significant hot flashes and fatigue when he gets ADT.    Patient presently on continuous ADT since July 2021 which she gets every 6 months.  Bone scan did not show any evidence of metastatic disease.   Patient noted to have PSA doubling time of about 3 months in August 2022.  CT abdomen and bone scan did not show any evidence of distant metastatic disease.Patient started on apalutamide for castrate resistant nonmetastatic prostate cancer    Interval history-patient has been  taking apalutamide consistently without missing any doses.  He has some ongoing fatigue and self-limited hot flashes but denies other complaints at this time  ECOG PS- 1 Pain scale-0   Review of systems- Review of Systems  Constitutional:  Positive for malaise/fatigue. Negative for chills, fever and weight loss.  HENT:  Negative for congestion, ear discharge and nosebleeds.   Eyes:  Negative for blurred vision.  Respiratory:  Negative for cough, hemoptysis, sputum production, shortness of breath and wheezing.   Cardiovascular:  Negative for chest pain, palpitations, orthopnea and claudication.  Gastrointestinal:  Negative for abdominal pain, blood in stool, constipation, diarrhea, heartburn, melena, nausea and vomiting.  Genitourinary:  Negative for dysuria, flank pain, frequency, hematuria and urgency.  Musculoskeletal:  Negative for back pain, joint pain and myalgias.  Skin:  Negative for rash.  Neurological:  Negative for dizziness, tingling, focal weakness, seizures, weakness and headaches.  Endo/Heme/Allergies:  Does not bruise/bleed easily.  Psychiatric/Behavioral:  Negative for depression and suicidal ideas. The patient does not have insomnia.       Allergies  Allergen Reactions   Penicillins Other (See Comments)    As a child     Past Medical History:  Diagnosis Date   Arthritis    H/O blood clots    H/O blood clots    in left leg in 1988   Hx of dysplastic nevus 06/18/2013   L lat ankle - mild   Melanoma ([place]) 02/21/2016   L prox med plantar great toe - Breslow's 1.33m, Clark level early 4   Prostate cancer ([facility])    Rash    occasionally and pt does see a dermatologist   Sleep apnea  uses a CPAP;sleep study >68yr ago     Past Surgical History:  Procedure Laterality Date   TOE SURGERY     melanoma   TOTAL KNEE ARTHROPLASTY  2009-2010   bilateral   VIDEO ASSISTED THORACOSCOPY (VATS)/DECORTICATION Left 05/06/2012   Procedure: VIDEO ASSISTED THORACOSCOPY  (VATS) drainage loculated pleural effusion;  Surgeon: EGrace Isaac MD;  [facility]  Service: Thoracic;  Laterality: Left;   VIDEO BRONCHOSCOPY N/A 05/06/2012   Procedure: VIDEO BRONCHOSCOPY;  Surgeon: EGrace Isaac MD;  [facility];  Service: Thoracic;  Laterality: N/A;    Social History   Socioeconomic History   Marital status: Married    Spouse name: Not on file   Number of children: Not on file   Years of education: tech school   Highest education level: High school graduate  Occupational History   Occupation: retired  Tobacco Use   Smoking status: Former    Packs/day: 1.00    Years: 26.00    Total pack years: 26.00    Types: Cigarettes    Quit date: 03/26/1989    Years since quitting: 33.1   Smokeless tobacco: Former   Tobacco comments:    less than 1 PPD  Vaping Use   Vaping Use: Never used  Substance and Sexual Activity   Alcohol use: No    Alcohol/week: 0.0 standard drinks of alcohol   Drug use: No   Sexual activity: Yes  Other Topics Concern   Not on file  Social History Narrative   Not on file   Social Determinants of Health   Financial Resource Strain: Low Risk  (01/07/2018)   Overall Financial Resource Strain (CARDIA)    Difficulty of Paying Living Expenses: Not hard at all  Food Insecurity: No Food Insecurity (01/07/2018)   Hunger Vital Sign    Worried About Running Out of Food in the Last Year: Never true    RC[place] the Last Year: Never true  Transportation Needs: No Transportation Needs (01/07/2018)   PRAPARE - THydrologist(Medical): No    Lack of Transportation (Non-Medical): No  Physical Activity: Inactive (01/07/2018)   Exercise Vital Sign    Days of Exercise per Week: 0 days    Minutes of Exercise per Session: 0 min  Stress: No Stress Concern Present (01/07/2018)   [place]   Feeling of Stress : Not at all  Social  Connections: Moderately Integrated (01/07/2018)   Social Connection and Isolation Panel [NHANES]    Frequency of Communication with Friends and Family: More than three times a week    Frequency of Social Gatherings with Friends and Family: More than three times a week    Attends Religious Services: More than 4 times per year    Active Member of [employer] Organizations: No    Attends CArchivistMeetings: Never    Marital Status: Married  IHuman resources officerViolence: Not At Risk (01/07/2018)   Humiliation, Afraid, Rape, and Kick questionnaire    Fear of Current or Ex-Partner: No    Emotionally Abused: No    Physically Abused: No    Sexually Abused: No    Family History  Problem Relation Age of Onset   Hypertension Mother    Stroke Mother        possible   Cancer Brother      Current Outpatient Medications:    apalutamide (ERLEADA) 60  MG tablet, Take 4 tablets (240 mg total) by mouth daily., Disp: 120 tablet, Rfl: 5   aspirin EC 81 MG tablet, Take 81 mg by mouth., Disp: , Rfl:    atorvastatin (LIPITOR) 10 MG tablet, Take 1 tablet (10 mg total) by mouth at bedtime., Disp: 90 tablet, Rfl: 3   benzonatate (TESSALON) 200 MG capsule, Take 1 capsule (200 mg total) by mouth 3 (three) times daily as needed., Disp: 30 capsule, Rfl: 0   hydrochlorothiazide (HYDRODIURIL) 12.5 MG tablet, Take 1 tablet by mouth once daily, Disp: 90 tablet, Rfl: 1   lisinopril (ZESTRIL) 20 MG tablet, Take 1 tablet (20 mg total) by mouth daily., Disp: 90 tablet, Rfl: 3   pimecrolimus (ELIDEL) 1 % cream, APPLY   TOPICALLY TO AFFECTED AREA IN GROIN TWICE DAILY, Disp: 60 g, Rfl: 0  Physical exam:  Vitals:   04/27/22 1315  BP: 125/73  Pulse: 75  Resp: 18  Temp: (!) 97.3 F (36.3 C)  TempSrc: Tympanic  SpO2: 97%  Weight: 242 lb 11.2 oz (110.1 kg)   Physical Exam Cardiovascular:     Rate and Rhythm: Normal rate and regular rhythm.     Heart sounds: Normal heart sounds.  Pulmonary:     Effort:  Pulmonary effort is normal.     Breath sounds: Normal breath sounds.  Abdominal:     General: Bowel sounds are normal.     Palpations: Abdomen is soft.  Skin:    General: Skin is warm and dry.  Neurological:     Mental Status: He is alert and oriented to person, place, and time.         Latest Ref Rng & Units 04/27/2022   12:59 PM  CMP  Glucose 70 - 99 mg/dL 123   BUN 8 - 23 mg/dL 22   Creatinine 0.61 - 1.24 mg/dL 1.12   Sodium 135 - 145 mmol/L 138   Potassium 3.5 - 5.1 mmol/L 3.8   Chloride 98 - 111 mmol/L 103   CO2 22 - 32 mmol/L 25   Calcium 8.9 - 10.3 mg/dL 9.2   Total Protein 6.5 - 8.1 g/dL 7.1   Total Bilirubin 0.3 - 1.2 mg/dL 0.2   Alkaline Phos 38 - 126 U/L 60   AST 15 - 41 U/L 18   ALT 0 - 44 U/L 12       Latest Ref Rng & Units 04/27/2022   12:59 PM  CBC  WBC 4.0 - 10.5 K/uL 7.2   Hemoglobin 13.0 - 17.0 g/dL 13.0   Hematocrit 39.0 - 52.0 % 37.9   Platelets 150 - 400 K/uL 245      Assessment and plan- Patient is a 77 y.o. male history of nonmetastatic castrate resistant prostate cancer currently on ADT plus apalutamide here for routine follow-up  Patient was started on apalutamide after he had a consistent rise in his PSA on ADT alone.  After starting apalutamide his PSA has consistently remained undetectable.  He will therefore continue the combination until progression or toxicity.  He will receive Lupron today and I will see him back in 3 months with CBC with differential CMP and PSA.  He did have bone density scan last year which was normal.  We will continue to monitor it every other year.   Visit Diagnosis 1. Malignant neoplasm of prostate ([place])   2. High risk medication use      Dr. Randa Evens, MD, MPH [facility] [phone] 04/28/2022  5:53 PM

## 2022-05-08 DIAGNOSIS — G4733 Obstructive sleep apnea (adult) (pediatric): Secondary | ICD-10-CM | POA: Diagnosis not present

## 2022-05-16 ENCOUNTER — Other Ambulatory Visit: Payer: Self-pay | Admitting: Family Medicine

## 2022-05-16 DIAGNOSIS — I10 Essential (primary) hypertension: Secondary | ICD-10-CM

## 2022-05-16 NOTE — Telephone Encounter (Signed)
Lisinopril was decreased to 28m on 01/18/22. Refills were sent in #90/3 RF.   Requested Prescriptions  Pending Prescriptions Disp Refills   hydrochlorothiazide (HYDRODIURIL) 12.5 MG tablet [Pharmacy Med Name: hydroCHLOROthiazide 12.5 MG Oral Tablet] 90 tablet 0    Sig: Take 1 tablet by mouth once daily     Cardiovascular: Diuretics - Thiazide Passed - 05/16/2022  5:06 PM      Passed - Cr in normal range and within 180 days    Creat  Date Value Ref Range Status  01/18/2022 1.07 0.70 - 1.28 mg/dL Final   Creatinine, Ser  Date Value Ref Range Status  04/27/2022 1.12 0.61 - 1.24 mg/dL Final   Creatinine, Urine  Date Value Ref Range Status  01/18/2022 84 20 - 320 mg/dL Final         Passed - K in normal range and within 180 days    Potassium  Date Value Ref Range Status  04/27/2022 3.8 3.5 - 5.1 mmol/L Final  04/17/2012 3.6 3.5 - 5.1 mmol/L Final         Passed - Na in normal range and within 180 days    Sodium  Date Value Ref Range Status  04/27/2022 138 135 - 145 mmol/L Final  07/12/2015 143 134 - 144 mmol/L Final  04/17/2012 137 136 - 145 mmol/L Final         Passed - Last BP in normal range    BP Readings from Last 1 Encounters:  04/27/22 125/73         Passed - Valid encounter within last 6 months    Recent Outpatient Visits           2 months ago Exposure to influenza   [place] [facility]   [facility]   10 months ago Type 2 diabetes mellitus with other specified complication, without long-term current use of insulin ([facility]   [place] DO   [facility]   1 year ago Type 2 diabetes mellitus with other specified complication, without long-term current use of insulin ([facility]   CGreen Camp DO       Future Appointments             In 5 days KParks Ranger AD[facility] [place]  In 1 month [place]alene Bathe MD [place]           Refused Prescriptions Disp Refills   lisinopril (ZESTRIL) 30 MG tablet [Pharmacy Med Name: Lisinopril 30 MG Oral Tablet] 90 tablet 0    Sig: Take 1 tablet by mouth once daily     Cardiovascular:  ACE Inhibitors Passed - 05/16/2022  5:06 PM      Passed - Cr in normal range and within 180 days    Creat  Date Value Ref Range Status  01/18/2022 1.07 0.70 - 1.28 mg/dL Final   Creatinine, Ser  Date Value Ref Range Status  04/27/2022 1.12 0.61 - 1.24 mg/dL Final   Creatinine, Urine  Date Value Ref Range Status  01/18/2022 84 20 - 320 mg/dL Final         Passed - K in normal  range and within 180 days    Potassium  Date Value Ref Range Status  04/27/2022 3.8 3.5 - 5.1 mmol/L Final  04/17/2012 3.6 3.5 - 5.1 mmol/L Final         Passed - Patient is not pregnant      Passed - Last BP in normal range    BP Readings from Last 1 Encounters:  04/27/22 125/73         Passed - Valid encounter within last 6 months    Recent Outpatient Visits           2 months ago Exposure to influenza   [facility] [place], Coralie Keens, NP   3 months ago Annual physical exam   [facility] Olin Hauser, DO   10 months ago Type 2 diabetes mellitus with other specified complication, without long-term current use of insulin [facility])   [place], DO   1 year ago Annual physical exam   [facility] Olin Hauser, DO   1 year ago Type 2 diabetes mellitus with other specified complication, without long-term current use of insulin [facility])   [place], DO       Future Appointments             In 5 days Parks Ranger, Devonne Doughty, DO [facility], [place]   In 1 month Ralene Bathe, MD [place]

## 2022-05-21 ENCOUNTER — Ambulatory Visit (INDEPENDENT_AMBULATORY_CARE_PROVIDER_SITE_OTHER): Payer: PPO | Admitting: Family Medicine

## 2022-05-21 ENCOUNTER — Other Ambulatory Visit: Payer: Self-pay | Admitting: Family Medicine

## 2022-05-21 ENCOUNTER — Encounter: Payer: Self-pay | Admitting: Family Medicine

## 2022-05-21 VITALS — BP 120/72 | HR 57 | Ht 70.0 in | Wt 241.4 lb

## 2022-05-21 DIAGNOSIS — I10 Essential (primary) hypertension: Secondary | ICD-10-CM

## 2022-05-21 DIAGNOSIS — G4733 Obstructive sleep apnea (adult) (pediatric): Secondary | ICD-10-CM

## 2022-05-21 DIAGNOSIS — E1169 Type 2 diabetes mellitus with other specified complication: Secondary | ICD-10-CM

## 2022-05-21 DIAGNOSIS — E785 Hyperlipidemia, unspecified: Secondary | ICD-10-CM

## 2022-05-21 DIAGNOSIS — C61 Malignant neoplasm of prostate: Secondary | ICD-10-CM

## 2022-05-21 DIAGNOSIS — Z0000 Encounter for general adult medical examination without abnormal findings: Secondary | ICD-10-CM

## 2022-05-21 DIAGNOSIS — R972 Elevated prostate specific antigen [PSA]: Secondary | ICD-10-CM

## 2022-05-21 DIAGNOSIS — Z89412 Acquired absence of left great toe: Secondary | ICD-10-CM

## 2022-05-21 LAB — POCT GLYCOSYLATED HEMOGLOBIN (HGB A1C): Hemoglobin A1C: 7 % — AB (ref 4.0–5.6)

## 2022-05-21 MED ORDER — LISINOPRIL 20 MG PO TABS: 20.0000 mg | ORAL_TABLET | Freq: Every day | ORAL | 3 refills | Status: DC

## 2022-05-21 MED ORDER — ATORVASTATIN CALCIUM 10 MG PO TABS: 10.0000 mg | ORAL_TABLET | Freq: Every day | ORAL | 3 refills | Status: DC

## 2022-05-21 NOTE — Assessment & Plan Note (Addendum)
Controlled cholesterol on lifestyle  Plan: 1. Continue Atorvastatin '10mg'$  nightly 2. Continue ASA '81mg'$  for primary ASCVD risk reduction 3. Encourage improved lifestyle - low carb/cholesterol, reduce portion size, continue improving regular exercise

## 2022-05-21 NOTE — Patient Instructions (Addendum)
Thank you for coming to the office today.  Recent Labs    01/11/22 0809 05/21/22 0920  HGBA1C 6.6* 7.0*   Refilled all medications. I will check with pharmacy to make sure they are updated  Wt loss 4-5 lbs in past few months.  Keep improving diet as you are.   DUE for FASTING BLOOD WORK (no food or drink after midnight before the lab appointment, only water or coffee without cream/sugar on the morning of)  SCHEDULE "Lab Only" visit in the morning at the clinic for lab draw in 8 MONTHS   - Make sure Lab Only appointment is at about 1 week before your next appointment, so that results will be available  For Lab Results, once available within 2-3 days of blood draw, you can can log in to MyChart online to view your results and a brief explanation. Also, we can discuss results at next follow-up visit.   Please schedule a Follow-up Appointment to: Return in about 8 months (around 01/19/2023) for 8 month fasting lab only then 1 week later Annual Physical.  If you have any other questions or concerns, please feel free to call the office or send a message through [place]. You may also schedule an earlier appointment if necessary.  Additionally, you may be receiving a survey about your experience at our office within a few days to 1 week by e-mail or mail. We value your feedback.  [name], DO [place]

## 2022-05-21 NOTE — Assessment & Plan Note (Signed)
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

## 2022-05-21 NOTE — Assessment & Plan Note (Signed)
Controlled - Home BP readings not available No known complications  OFF Furosemide    Plan:  1. Continue HCTZ 12.'5mg'$  daily for HTN and also likely for edema, continue Lisinopril '20mg'$  daily (reduced dose) 2. Encourage improved lifestyle - low sodium diet, improve regular exercise 3. Continue monitor BP outside office, bring readings to next visit, if persistently >140/90 or new symptoms notify office sooner

## 2022-05-21 NOTE — Assessment & Plan Note (Signed)
A1c up to 7.0, prior mid 6 range. This is slightly elevated but remains well controlled off medication No hypoglycemia or hyperglycemia Complications - hyperlipidemia, OSA - increases risk of future cardiovascular complications   Plan:  - Not on medication for DM, it is diet controlled - Encourage improved lifestyle - low carb, low sugar diet, reduce portion size, continue improving regular exercise - May continue chronic case management for comprehensive nursing education on diabetes among other benefits - Continue ASA, ACEi, Statin  Future DM Eye Dr Terrilee Files - request copy of last record.

## 2022-05-21 NOTE — Progress Notes (Signed)
Subjective:    Patient ID: Eric Stone., male    DOB: Feb 03, 1946, 77 y.o.   MRN: [id number]  Eric Stone. is a 77 y.o. male presenting on 05/21/2022 for Diabetes, Hypertension, and Chronic Kidney Disease   HPI  CHRONIC HTN: CKD II Last lab improved Cr 1.12 stable now Controlled. Not checking regularly now. Current Meds - Lisinopril '20mg'$  daily, HCTZ 12.'5mg'$  daily Reports good compliance, took meds today. Tolerating well, w/o complaints. - Admits mild varicose veins edema, but improved on compression stocking Completed Diabetic Eye Exam 01/2022 Dr Eric Stone in [place] need a copy of result Denies CP, dyspnea, HA, dizziness / lightheadedness  Type 2 Diabetes Due A1c today Prior A1c 6.6 to 6.7 CBGs Never checked. Meds: never on meds Currently on ACEi Lifestyle: Weight down 5 lbs in 2 months - Diet (Trying to maintain improved diet, reduce carbs portions, inc water) - Exercise (Improved activity and exercise) - S/p L Great Toe Amputation Next DM Eye Exam 12/2021 Dr Eric Stone Denies hypoglycemia, polyuria, visual changes, numbness or tingling.   HYPERLIPIDEMIA: - Reports no concerns. - Currently taking Rosuvastatin '10mg'$ , tolerating well without side effects or myalgias   OSA, on CPAP - Patient reports prior history of dx OSA and on CPAP for >20 years, prior to treatment his initial symptoms were snoring, daytime sleepiness and fatigue, he has had several sleep studies in the past. Previously dx mild to moderate OSA. - He reports that his sleep apnea is well controlled. He uses the CPAP machine every night. He tolerates the machine well, and thinks that he sleeps better with it and feels good. No new concerns or symptoms.    PMH - Melanoma, Left big toe / History of Skin Cancer S/p amputation - followed by Eric Stone   PMH Chronic Low Back Pain, with osteoarthritis Has seen Eric Peper PA - [place], had x-rays, copied below of lumbar spine, has gradually improved,  advanced OA/DJD, no other intervention. He can return if worsening.   S/p prostate cancer Followed by Dr Eric Stone and Dr Eric Stone for Prostate Cancer Last report updated 04/27/22 with PSA still undetectable < 0.01 on therapy. He continues on Lupron injection plus apalutamide Next repeat May 2024   Health Maintenance:  Future pause Cologuard today, he is due but declines this time, will reconsider next visit.     05/21/2022    9:47 AM 01/18/2022    9:10 AM 07/17/2021   11:08 AM  Depression screen PHQ 2/9  Decreased Interest 0 0 0  Down, Depressed, Hopeless 0 0 0  PHQ - 2 Score 0 0 0  Altered sleeping 0 0 0  Tired, decreased energy '1 1 1  '$ Change in appetite 0 0 0  Feeling bad or failure about yourself  0 0 0  Trouble concentrating 0 0 0  Moving slowly or fidgety/restless 0 0 0  Suicidal thoughts 0 0 0  PHQ-9 Score '1 1 1  '$ Difficult doing work/chores Not difficult at all Not difficult at all Not difficult at all    Social History   Tobacco Use   Smoking status: Former    Packs/day: 1.00    Years: 26.00    Total pack years: 26.00    Types: Cigarettes    Quit date: 03/26/1989    Years since quitting: 33.1   Smokeless tobacco: Former   Tobacco comments:    less than 1 PPD  Vaping Use   Vaping Use: Never used  Substance Use  Topics   Alcohol use: No    Alcohol/week: 0.0 standard drinks of alcohol   Drug use: No    Review of Systems Per HPI unless specifically indicated above     Objective:    BP 120/72   Pulse (!) 57   Ht '5\' 10"'$  (1.778 m)   Wt 241 lb 6.4 oz (109.5 kg)   SpO2 98%   BMI 34.64 kg/m   Wt Readings from Last 3 Encounters:  05/21/22 241 lb 6.4 oz (109.5 kg)  04/27/22 242 lb 11.2 oz (110.1 kg)  03/08/22 246 lb (111.6 kg)    Physical Exam Vitals and nursing note reviewed.  Constitutional:      General: He is not in acute distress.    Appearance: He is well-developed. He is not diaphoretic.     Comments: Well-appearing, comfortable, cooperative  HENT:      Head: Normocephalic and atraumatic.  Eyes:     General:        Right eye: No discharge.        Left eye: No discharge.     Conjunctiva/sclera: Conjunctivae normal.  Neck:     Thyroid: No thyromegaly.  Cardiovascular:     Rate and Rhythm: Normal rate and regular rhythm.     Pulses: Normal pulses.     Heart sounds: Normal heart sounds. No murmur heard. Pulmonary:     Effort: Pulmonary effort is normal. No respiratory distress.     Breath sounds: Normal breath sounds. No wheezing or rales.  Musculoskeletal:        General: Normal range of motion.     Cervical back: Normal range of motion and neck supple.  Lymphadenopathy:     Cervical: No cervical adenopathy.  Skin:    General: Skin is warm and dry.     Findings: No erythema or rash.  Neurological:     Mental Status: He is alert and oriented to person, place, and time. Mental status is at baseline.  Psychiatric:        Behavior: Behavior normal.     Comments: Well groomed, good eye contact, normal speech and thoughts       Results for orders placed or performed in visit on 05/21/22  POCT HgB A1C  Result Value Ref Range   Hemoglobin A1C 7.0 (A) 4.0 - 5.6 %      Assessment & Plan:   Problem List Items Addressed This Visit     Essential hypertension    Controlled - Home BP readings not available No known complications  OFF Furosemide    Plan:  1. Continue HCTZ 12.'5mg'$  daily for HTN and also likely for edema, continue Lisinopril '20mg'$  daily (reduced dose) 2. Encourage improved lifestyle - low sodium diet, improve regular exercise 3. Continue monitor BP outside office, bring readings to next visit, if persistently >140/90 or new symptoms notify office sooner      Relevant Medications   lisinopril (ZESTRIL) 20 MG tablet   atorvastatin (LIPITOR) 10 MG tablet   History of amputation of left great toe (HCC)   Hyperlipidemia associated with type 2 diabetes mellitus ([place])    Controlled cholesterol on  lifestyle  Plan: 1. Continue Atorvastatin '10mg'$  nightly 2. Continue ASA '81mg'$  for primary ASCVD risk reduction 3. 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 This is slightly elevated but remains well controlled off medication No hypoglycemia or hyperglycemia Complications - hyperlipidemia, OSA - increases risk of future cardiovascular complications   Plan:  - Not on medication for DM, it is diet controlled - Encourage improved lifestyle - low carb, low sugar diet, reduce portion size, continue improving regular exercise - May continue chronic case management for comprehensive nursing education on diabetes among other benefits - Continue ASA, ACEi, Statin  Future DM Eye Dr Eric Stone - request copy of last record.      Relevant Medications   lisinopril (ZESTRIL) 20 MG tablet   atorvastatin (LIPITOR) 10 MG tablet   Other Relevant Orders   POCT HgB A1C (Completed)       Orders Placed This Encounter  Procedures   POCT HgB A1C     Meds ordered this encounter  Medications   lisinopril (ZESTRIL) 20 MG tablet    Sig: Take 1 tablet (20 mg total) by mouth daily.    Dispense:  90 tablet    Refill:  3   atorvastatin (LIPITOR) 10 MG tablet    Sig: Take 1 tablet (10 mg total) by mouth at bedtime.    Dispense:  90 tablet    Refill:  3    Add refills      Follow up plan: Return in about 8 months (around 01/19/2023) for 8 month fasting lab only then 1 week later Annual Physical.  Future labs  ordered for 12/2022, will defer PSA to Oncology.   [name], [facility] 05/21/2022, 9:20 AM

## 2022-05-23 ENCOUNTER — Encounter: Payer: Self-pay | Admitting: Family Medicine

## 2022-07-02 ENCOUNTER — Ambulatory Visit: Payer: PPO | Admitting: Dermatology

## 2022-07-02 ENCOUNTER — Encounter: Payer: Self-pay | Admitting: Dermatology

## 2022-07-02 VITALS — BP 155/66 | HR 65

## 2022-07-02 DIAGNOSIS — L570 Actinic keratosis: Secondary | ICD-10-CM

## 2022-07-02 DIAGNOSIS — L821 Other seborrheic keratosis: Secondary | ICD-10-CM

## 2022-07-02 DIAGNOSIS — Z7189 Other specified counseling: Secondary | ICD-10-CM | POA: Diagnosis not present

## 2022-07-02 DIAGNOSIS — Z1283 Encounter for screening for malignant neoplasm of skin: Secondary | ICD-10-CM | POA: Diagnosis not present

## 2022-07-02 DIAGNOSIS — Z79899 Other long term (current) drug therapy: Secondary | ICD-10-CM | POA: Diagnosis not present

## 2022-07-02 DIAGNOSIS — L299 Pruritus, unspecified: Secondary | ICD-10-CM | POA: Diagnosis not present

## 2022-07-02 DIAGNOSIS — L814 Other melanin hyperpigmentation: Secondary | ICD-10-CM

## 2022-07-02 DIAGNOSIS — D1801 Hemangioma of skin and subcutaneous tissue: Secondary | ICD-10-CM

## 2022-07-02 DIAGNOSIS — D229 Melanocytic nevi, unspecified: Secondary | ICD-10-CM | POA: Diagnosis not present

## 2022-07-02 DIAGNOSIS — Z85820 Personal history of malignant melanoma of skin: Secondary | ICD-10-CM

## 2022-07-02 DIAGNOSIS — L578 Other skin changes due to chronic exposure to nonionizing radiation: Secondary | ICD-10-CM | POA: Diagnosis not present

## 2022-07-02 DIAGNOSIS — Z86018 Personal history of other benign neoplasm: Secondary | ICD-10-CM

## 2022-07-02 DIAGNOSIS — L304 Erythema intertrigo: Secondary | ICD-10-CM | POA: Diagnosis not present

## 2022-07-02 MED ORDER — PIMECROLIMUS 1 % EX CREA: TOPICAL_CREAM | CUTANEOUS | 11 refills | Status: AC

## 2022-07-02 MED ORDER — HYDROCORTISONE 2.5 % EX CREA: TOPICAL_CREAM | CUTANEOUS | 11 refills | Status: AC

## 2022-07-02 MED ORDER — KETOCONAZOLE 2 % EX CREA: TOPICAL_CREAM | CUTANEOUS | 11 refills | Status: AC

## 2022-07-02 NOTE — Progress Notes (Signed)
Follow-Up Visit   Subjective  Eric BuryJohn A Dake Jr. is a 77 y.o. male who presents for the following: Skin Cancer Screening and Full Body Skin Exam. HxMM. HxDN  The patient presents for Total-Body Skin Exam (TBSE) for skin cancer screening and mole check. The patient has spots, moles and lesions to be evaluated, some may be new or changing and the patient has concerns that these could be cancer.    The following portions of the chart were reviewed this encounter and updated as appropriate: medications, allergies, medical history  Review of Systems:  No other skin or systemic complaints except as noted in HPI or Assessment and Plan.  Objective  Well appearing patient in no apparent distress; mood and affect are within normal limits.  A full examination was performed including scalp, head, eyes, ears, nose, lips, neck, chest, axillae, abdomen, back, buttocks, bilateral upper extremities, bilateral lower extremities, hands, feet, fingers, toes, fingernails, and toenails. All findings within normal limits unless otherwise noted below.   Relevant physical exam findings are noted in the Assessment and Plan.    Assessment & Plan    HISTORY OF MELANOMA. Left proximal med plantar great toe. Breslow's 1.3535mm, Clark's level early IV. 02/21/2016 - No evidence of recurrence today - No lymphadenopathy - Recommend regular full body skin exams - Recommend daily broad spectrum sunscreen SPF 30+ to sun-exposed areas, reapply every 2 hours as needed.  - Call if any new or changing lesions are noted between office visits   HISTORY OF DYSPLASTIC NEVUS. Left lateral ankle, mild. 2015 No evidence of recurrence today Recommend regular full body skin exams Recommend daily broad spectrum sunscreen SPF 30+ to sun-exposed areas, reapply every 2 hours as needed.  Call if any new or changing lesions are noted between office visits    LENTIGINES, SEBORRHEIC KERATOSES, HEMANGIOMAS - Benign normal skin  lesions - Benign-appearing - Call for any changes  MELANOCYTIC NEVI - Tan-brown and/or pink-flesh-colored symmetric macules and papules - Benign appearing on exam today - Observation - Call clinic for new or changing moles - Recommend daily use of broad spectrum spf 30+ sunscreen to sun-exposed areas.   ACTINIC DAMAGE - Chronic condition, secondary to cumulative UV/sun exposure - diffuse scaly erythematous macules with underlying dyspigmentation - Recommend daily broad spectrum sunscreen SPF 30+ to sun-exposed areas, reapply every 2 hours as needed.  - Staying in the shade or wearing long sleeves, sun glasses (UVA+UVB protection) and wide brim hats (4-inch brim around the entire circumference of the hat) are also recommended for sun protection.  - Call for new or changing lesions.  SKIN CANCER SCREENING PERFORMED TODAY.  ACTINIC KERATOSIS Exam: Erythematous thin papules/macules with gritty scale  Actinic keratoses are precancerous spots that appear secondary to cumulative UV radiation exposure/sun exposure over time. They are chronic with expected duration over 1 year. A portion of actinic keratoses will progress to squamous cell carcinoma of the skin. It is not possible to reliably predict which spots will progress to skin cancer and so treatment is recommended to prevent development of skin cancer.  Recommend daily broad spectrum sunscreen SPF 30+ to sun-exposed areas, reapply every 2 hours as needed.  Recommend staying in the shade or wearing long sleeves, sun glasses (UVA+UVB protection) and wide brim hats (4-inch brim around the entire circumference of the hat). Call for new or changing lesions.  Treatment Plan:  Prior to procedure, discussed risks of blister formation, small wound, skin dyspigmentation, or rare scar following cryotherapy. Recommend Vaseline  ointment to treated areas while healing.  Destruction Procedure Note Destruction method: cryotherapy   Informed consent:  discussed and consent obtained   Lesion destroyed using liquid nitrogen: Yes   Outcome: patient tolerated procedure well with no complications   Post-procedure details: wound care instructions given   Locations: face x2 # of Lesions Treated: 2   INTERTRIGO Exam Erythematous macerated patches at infra-abdominal area  Chronic and persistent condition with duration or expected duration over one year. Condition is bothersome/symptomatic for patient. Currently flared.   Intertrigo is a chronic recurrent rash that occurs in skin fold areas that may be associated with friction; heat; moisture; yeast; fungus; and bacteria.  It is exacerbated by increased movement / activity; sweating; and higher atmospheric temperature.  Treatment Plan Apply ketoconazole cream M, W, F at bedtime to rash at abdomen. Hydrocortisone 2.5 % cream T, TH, S at bedtime to rash at abdomen.  Pruritus Groin. Continue Pimecrolimus cream twice daily to groin area as needed for itching.   Return in about 1 year (around 07/02/2023) for TBSE, HxMM, HxDN.  I, Lawson Radar, CMA, am acting as scribe for Armida Sans, MD.   Documentation: I have reviewed the above documentation for accuracy and completeness, and I agree with the above.  Armida Sans, MD

## 2022-07-02 NOTE — Patient Instructions (Addendum)
Rash at abdomen/Waistline Apply ketoconazole cream M, W, F at bedtime to rash at abdomen. Hydrocortisone 2.5 % cream T, TH, S at bedtime to rash at abdomen.  Groin: Continue Pimecrolimus cream twice daily to groin area as needed for itching.     Recommend daily broad spectrum sunscreen SPF 30+ to sun-exposed areas, reapply every 2 hours as needed. Call for new or changing lesions.  Staying in the shade or wearing long sleeves, sun glasses (UVA+UVB protection) and wide brim hats (4-inch brim around the entire circumference of the hat) are also recommended for sun protection.    Melanoma ABCDEs  Melanoma is the most dangerous type of skin cancer, and is the leading cause of death from skin disease.  You are more likely to develop melanoma if you: Have light-colored skin, light-colored eyes, or red or blond hair Spend a lot of time in the sun Tan regularly, either outdoors or in a tanning bed Have had blistering sunburns, especially during childhood Have a close family member who has had a melanoma Have atypical moles or large birthmarks  Early detection of melanoma is key since treatment is typically straightforward and cure rates are extremely high if we catch it early.   The first sign of melanoma is often a change in a mole or a new dark spot.  The ABCDE system is a way of remembering the signs of melanoma.  A for asymmetry:  The two halves do not match. B for border:  The edges of the growth are irregular. C for color:  A mixture of colors are present instead of an even brown color. D for diameter:  Melanomas are usually (but not always) greater than 34mm - the size of a pencil eraser. E for evolution:  The spot keeps changing in size, shape, and color.  Please check your skin once per month between visits. You can use a small mirror in front and a large mirror behind you to keep an eye on the back side or your body.   If you see any new or changing lesions before your next  follow-up, please call to schedule a visit.  Please continue daily skin protection including broad spectrum sunscreen SPF 30+ to sun-exposed areas, reapplying every 2 hours as needed when you're outdoors.   Staying in the shade or wearing long sleeves, sun glasses (UVA+UVB protection) and wide brim hats (4-inch brim around the entire circumference of the hat) are also recommended for sun protection.    Due to recent changes in healthcare laws, you may see results of your pathology and/or laboratory studies on MyChart before the doctors have had a chance to review them. We understand that in some cases there may be results that are confusing or concerning to you. Please understand that not all results are received at the same time and often the doctors may need to interpret multiple results in order to provide you with the best plan of care or course of treatment. Therefore, we ask that you please give [place] 2 business days to thoroughly review all your results before contacting the office for clarification. Should we see a critical lab result, you will be contacted sooner.   If You Need Anything After Your Visit  If you have any questions or concerns for your doctor, please call our main line at [phone] and press option 4 to reach your doctor's medical assistant. If no one answers, please leave a voicemail as directed and we will return your call as soon  as possible. Messages left after 4 pm will be answered the following business day.   You may also send [place] a message via MyChart. We typically respond to MyChart messages within 1-2 business days.  For prescription refills, please ask your pharmacy to contact our office. Our fax number is [phone].  If you have an urgent issue when the clinic is closed that cannot wait until the next business day, you can page your doctor at the number below.    Please note that while we do our best to be available for urgent issues outside of office hours,  we are not available 24/7.   If you have an urgent issue and are unable to reach [place], you may choose to seek medical care at your doctor's office, retail clinic, urgent care center, or emergency room.  If you have a medical emergency, please immediately call 911 or go to the emergency department.  Pager Numbers  - Dr. Gwen Pounds: [phone]  - Dr. Neale Burly: [phone]  - Dr. Roseanne Reno: [phone]  In the event of inclement weather, please call our main line at [phone] for an update on the status of any delays or closures.  Dermatology Medication Tips: Please keep the boxes that topical medications come in in order to help keep track of the instructions about where and how to use these. Pharmacies typically print the medication instructions only on the boxes and not directly on the medication tubes.   If your medication is too expensive, please contact our office at [phone] option 4 or send [place] a message through MyChart.   We are unable to tell what your co-pay for medications will be in advance as this is different depending on your insurance coverage. However, we may be able to find a substitute medication at lower cost or fill out paperwork to get insurance to cover a needed medication.   If a prior authorization is required to get your medication covered by your insurance company, please allow [place] 1-2 business days to complete this process.  Drug prices often vary depending on where the prescription is filled and some pharmacies may offer cheaper prices.  The website www.goodrx.com contains coupons for medications through different pharmacies. The prices here do not account for what the cost may be with help from insurance (it may be cheaper with your insurance), but the website can give you the price if you did not use any insurance.  - You can print the associated coupon and take it with your prescription to the pharmacy.  - You may also stop by our office during regular  business hours and pick up a GoodRx coupon card.  - If you need your prescription sent electronically to a different pharmacy, notify our office through [facility] or by phone at [phone] option 4.     Si Usted Necesita Algo Despus de Su Visita  Tambin puede enviarnos un mensaje a travs de Clinical cytogeneticist. Por lo general respondemos a los mensajes de MyChart en el transcurso de 1 a 2 das hbiles.  Para renovar recetas, por favor pida a su farmacia que se ponga en contacto con nuestra oficina. Annie Sable de fax es [place] [phone].  Si tiene un asunto urgente cuando la clnica est cerrada y que no puede esperar hasta el siguiente da hbil, puede llamar/localizar a su doctor(a) al nmero que aparece a continuacin.   Por favor, tenga en cuenta que aunque hacemos todo lo posible para estar disponibles para asuntos urgentes fuera del horario de  oficina, no estamos disponibles las 24 horas del da, los 7 das de la [place].   Si tiene un problema urgente y no puede comunicarse con nosotros, puede optar por buscar atencin mdica  en el consultorio de su doctor(a), en una clnica privada, en un centro de atencin urgente o en una sala de emergencias.  Si tiene Engineering geologist, por favor llame inmediatamente al 911 o vaya a la sala de emergencias.  Nmeros de bper  - Dr. Nehemiah Massed: [phone]  - Dra. Moye: [phone]  - Dra. Nicole Kindred: [phone]  En caso de inclemencias del [place], por favor llame a Johnsie Kindred principal al [phone] para una actualizacin sobre el [employer] de cualquier retraso o cierre.  Consejos para la medicacin en dermatologa: Por favor, guarde las cajas en las que vienen los medicamentos de uso tpico para ayudarle a seguir las instrucciones sobre dnde y cmo usarlos. Las farmacias generalmente imprimen las instrucciones del medicamento slo en las cajas y no directamente en los tubos del [place].   Si su medicamento es muy caro, por  favor, pngase en contacto con Zigmund Daniel llamando al [phone] y presione la opcin 4 o envenos un mensaje a travs de Pharmacist, community.   No podemos decirle cul ser su copago por los medicamentos por adelantado ya que esto es diferente dependiendo de la cobertura de su seguro. Sin embargo, es posible que podamos encontrar un medicamento sustituto a Electrical engineer un formulario para que el seguro cubra el medicamento que se considera necesario.   Si se requiere una autorizacin previa para que su compaa de seguros [place] su medicamento, por favor permtanos de 1 a 2 das hbiles para completar este proceso.  Los precios de los medicamentos varan con frecuencia dependiendo del Environmental consultant de dnde se surte la receta y alguna farmacias pueden ofrecer precios ms baratos.  El sitio web www.goodrx.com tiene cupones para medicamentos de Airline pilot. Los precios aqu no tienen en cuenta lo que podra costar con la ayuda del seguro (puede ser ms barato con su seguro), pero el sitio web puede darle el precio si no utiliz Research scientist (physical sciences).  - Puede imprimir el cupn correspondiente y llevarlo con su receta a la farmacia.  - Tambin puede pasar por nuestra oficina durante el horario de atencin regular y Charity fundraiser una tarjeta de cupones de GoodRx.  - Si necesita que su receta se enve electrnicamente a una farmacia diferente, informe a nuestra oficina a travs de MyChart de [place] o por telfono llamando al [phone] y presione la opcin 4.

## 2022-07-12 ENCOUNTER — Ambulatory Visit: Payer: PPO | Admitting: Dermatology

## 2022-07-16 ENCOUNTER — Encounter: Payer: Self-pay | Admitting: Dermatology

## 2022-07-27 ENCOUNTER — Inpatient Hospital Stay: Payer: PPO

## 2022-07-27 ENCOUNTER — Encounter: Payer: Self-pay | Admitting: Oncology

## 2022-07-27 ENCOUNTER — Inpatient Hospital Stay: Payer: PPO | Attending: Oncology | Admitting: Oncology

## 2022-07-27 VITALS — BP 133/70 | HR 70 | Temp 98.0°F | Resp 18 | Ht 70.0 in | Wt 241.8 lb

## 2022-07-27 DIAGNOSIS — Z79899 Other long term (current) drug therapy: Secondary | ICD-10-CM | POA: Diagnosis not present

## 2022-07-27 DIAGNOSIS — C61 Malignant neoplasm of prostate: Secondary | ICD-10-CM

## 2022-07-27 DIAGNOSIS — Z87891 Personal history of nicotine dependence: Secondary | ICD-10-CM | POA: Insufficient documentation

## 2022-07-27 LAB — CBC WITH DIFFERENTIAL/PLATELET
Abs Immature Granulocytes: 0.03 10*3/uL (ref 0.00–0.07)
Basophils Absolute: 0 10*3/uL (ref 0.0–0.1)
Basophils Relative: 1 %
Eosinophils Absolute: 0.3 10*3/uL (ref 0.0–0.5)
Eosinophils Relative: 5 %
HCT: 37.6 % — ABNORMAL LOW (ref 39.0–52.0)
Hemoglobin: 12.7 g/dL — ABNORMAL LOW (ref 13.0–17.0)
Immature Granulocytes: 0 %
Lymphocytes Relative: 20 %
Lymphs Abs: 1.5 10*3/uL (ref 0.7–4.0)
MCH: 29.7 pg (ref 26.0–34.0)
MCHC: 33.8 g/dL (ref 30.0–36.0)
MCV: 88.1 fL (ref 80.0–100.0)
Monocytes Absolute: 0.5 10*3/uL (ref 0.1–1.0)
Monocytes Relative: 7 %
Neutro Abs: 5.1 10*3/uL (ref 1.7–7.7)
Neutrophils Relative %: 67 %
Platelets: 226 10*3/uL (ref 150–400)
RBC: 4.27 MIL/uL (ref 4.22–5.81)
RDW: 13.1 % (ref 11.5–15.5)
WBC: 7.6 10*3/uL (ref 4.0–10.5)
nRBC: 0 % (ref 0.0–0.2)

## 2022-07-27 LAB — COMPREHENSIVE METABOLIC PANEL
ALT: 12 U/L (ref 0–44)
AST: 20 U/L (ref 15–41)
Albumin: 3.9 g/dL (ref 3.5–5.0)
Alkaline Phosphatase: 64 U/L (ref 38–126)
Anion gap: 9 (ref 5–15)
BUN: 23 mg/dL (ref 8–23)
CO2: 24 mmol/L (ref 22–32)
Calcium: 8.7 mg/dL — ABNORMAL LOW (ref 8.9–10.3)
Chloride: 106 mmol/L (ref 98–111)
Creatinine, Ser: 1.16 mg/dL (ref 0.61–1.24)
GFR, Estimated: >60 mL/min (ref >60)
Glucose, Bld: 148 mg/dL — ABNORMAL HIGH (ref 70–99)
Potassium: 3.6 mmol/L (ref 3.5–5.1)
Sodium: 139 mmol/L (ref 135–145)
Total Bilirubin: 0.1 mg/dL — ABNORMAL LOW (ref 0.3–1.2)
Total Protein: 6.8 g/dL (ref 6.5–8.1)

## 2022-07-27 LAB — PSA: Prostatic Specific Antigen: <0.01 ng/mL (ref 0.00–4.00)

## 2022-07-29 ENCOUNTER — Encounter: Payer: Self-pay | Admitting: Oncology

## 2022-07-29 NOTE — Progress Notes (Signed)
Hematology/Oncology Consult note [facility]  [phone] [phone]  Patient Care Team: Smitty Cords, DO as PCP - General (Family Medicine) Juanetta Gosling Teresita Madura., MD (Inactive) (Unknown Physician Specialty) Delight Ovens, MD (Inactive) as Consulting Physician (Cardiothoracic Surgery) Clance, Maree Krabbe, MD as Consulting Physician (Pulmonary Disease) Delight Ovens, MD (Inactive) as Consulting Physician (Cardiothoracic Surgery) Clance, Maree Krabbe, MD as Consulting Physician (Pulmonary Disease) Minor, Theadora Rama, RN (Inactive) as Case Manager Creig Hines, MD as Consulting Physician (Hematology and Oncology)   Name of the patient: Eric Stone  [id number]  02/03/1946   Date of visit: 07/29/22  Diagnosis-castrate resistant nonmetastatic prostate cancer  Chief complaint/ Reason for visit-routine follow-up of prostate cancer on Lupron and apalutamide  Heme/Onc history: patient is a 77 year old male who was diagnosed with Gleason 6 adenocarcinoma of the prostateAbout 5 years ago and received radiation treatment.  He has not undergone the surgery for his prostate but has seen Dr. Lonna Cobb in the past.  He was receiving intermittent ADT and last received 45 mg of Eligard on 09/30/2018.  His PSA following that went down to 0.87 December 2020.  He did not receive any ADT after that and his most recent PSA on 09/24/2019 was elevated at 5.08.  Patient reports having significant hot flashes and fatigue when he gets ADT.    Patient presently on continuous ADT since July 2021 which she gets every 6 months.  Bone scan did not show any evidence of metastatic disease.   Patient noted to have PSA doubling time of about 3 months in August 2022.  CT abdomen and bone scan did not show any evidence of distant metastatic disease.Patient started on apalutamide for castrate resistant nonmetastatic prostate cancer      Interval history-tolerating  apalutamide wellWithout any significant side effects.  He is getting Lupron every 6 months.  Occasional hot flashes and fatigue but still has a good quality of life.  ECOG PS- 1 Pain scale- 0   Review of systems- Review of Systems  Constitutional:  Positive for malaise/fatigue. Negative for chills, fever and weight loss.  HENT:  Negative for congestion, ear discharge and nosebleeds.   Eyes:  Negative for blurred vision.  Respiratory:  Negative for cough, hemoptysis, sputum production, shortness of breath and wheezing.   Cardiovascular:  Negative for chest pain, palpitations, orthopnea and claudication.  Gastrointestinal:  Negative for abdominal pain, blood in stool, constipation, diarrhea, heartburn, melena, nausea and vomiting.  Genitourinary:  Negative for dysuria, flank pain, frequency, hematuria and urgency.  Musculoskeletal:  Negative for back pain, joint pain and myalgias.  Skin:  Negative for rash.  Neurological:  Negative for dizziness, tingling, focal weakness, seizures, weakness and headaches.  Endo/Heme/Allergies:  Does not bruise/bleed easily.  Psychiatric/Behavioral:  Negative for depression and suicidal ideas. The patient does not have insomnia.       Allergies  Allergen Reactions   Penicillins Other (See Comments)    As a child     Past Medical History:  Diagnosis Date   Arthritis    H/O blood clots    H/O blood clots    in left leg in 1988   Hx of dysplastic nevus 06/18/2013   L lat ankle - mild   Melanoma (HCC) 02/21/2016   L prox med plantar great toe - Breslow's 1.71mm, Clark level early 4   Prostate cancer (HCC)    Rash    occasionally and pt does see a dermatologist  Sleep apnea    uses a CPAP;sleep study >50yrs ago     Past Surgical History:  Procedure Laterality Date   TOE SURGERY     melanoma   TOTAL KNEE ARTHROPLASTY  2009-2010   bilateral   VIDEO ASSISTED THORACOSCOPY (VATS)/DECORTICATION Left 05/06/2012   Procedure: VIDEO ASSISTED  THORACOSCOPY (VATS) drainage loculated pleural effusion;  Surgeon: Delight Ovens, MD;  Location: [facility] OR;  Service: Thoracic;  Laterality: Left;   VIDEO BRONCHOSCOPY N/A 05/06/2012   Procedure: VIDEO BRONCHOSCOPY;  Surgeon: Delight Ovens, MD;  Location: [facility] OR;  Service: Thoracic;  Laterality: N/A;    Social History   Socioeconomic History   Marital status: Married    Spouse name: Not on file   Number of children: Not on file   Years of education: tech school   Highest education level: High school graduate  Occupational History   Occupation: retired  Tobacco Use   Smoking status: Former    Packs/day: 1.00    Years: 26.00    Additional pack years: 0.00    Total pack years: 26.00    Types: Cigarettes    Quit date: 03/26/1989    Years since quitting: 33.3   Smokeless tobacco: Former   Tobacco comments:    less than 1 PPD  Vaping Use   Vaping Use: Never used  Substance and Sexual Activity   Alcohol use: No    Alcohol/week: 0.0 standard drinks of alcohol   Drug use: No   Sexual activity: Yes  Other Topics Concern   Not on file  Social History Narrative   Not on file   Social Determinants of Health   Financial Resource Strain: Low Risk  (01/07/2018)   Overall Financial Resource Strain (CARDIA)    Difficulty of Paying Living Expenses: Not hard at all  Food Insecurity: No Food Insecurity (01/07/2018)   Hunger Vital Sign    Worried About Running Out of Food in the Last Year: Never true    Ran Out of Food in the Last Year: Never true  Transportation Needs: No Transportation Needs (01/07/2018)   PRAPARE - Administrator, Civil Service (Medical): No    Lack of Transportation (Non-Medical): No  Physical Activity: Inactive (01/07/2018)   Exercise Vital Sign    Days of Exercise per Week: 0 days    Minutes of Exercise per Session: 0 min  Stress: No Stress Concern Present (01/07/2018)   [employer] of Occupational Health - Occupational Stress Questionnaire     Feeling of Stress : Not at all  Social Connections: Moderately Integrated (01/07/2018)   Social Connection and Isolation Panel [NHANES]    Frequency of Communication with Friends and Family: More than three times a week    Frequency of Social Gatherings with Friends and Family: More than three times a week    Attends Religious Services: More than 4 times per year    Active Member of [employer] or Organizations: No    Attends Banker Meetings: Never    Marital Status: Married  Catering manager Violence: Not At Risk (01/07/2018)   Humiliation, Afraid, Rape, and Kick questionnaire    Fear of Current or Ex-Partner: No    Emotionally Abused: No    Physically Abused: No    Sexually Abused: No    Family History  Problem Relation Age of Onset   Hypertension Mother    Stroke Mother        possible   Cancer Brother  Current Outpatient Medications:    apalutamide (ERLEADA) 60 MG tablet, Take 4 tablets (240 mg total) by mouth daily., Disp: 120 tablet, Rfl: 5   aspirin EC 81 MG tablet, Take 81 mg by mouth., Disp: , Rfl:    atorvastatin (LIPITOR) 10 MG tablet, Take 1 tablet (10 mg total) by mouth at bedtime., Disp: 90 tablet, Rfl: 3   hydrochlorothiazide (HYDRODIURIL) 12.5 MG tablet, Take 1 tablet by mouth once daily, Disp: 90 tablet, Rfl: 1   hydrocortisone 2.5 % cream, Apply T, TH, S at bedtime to rash at abdomen., Disp: 30 g, Rfl: 11   ketoconazole (NIZORAL) 2 % cream, Apply M, W, F at bedtime to rash at abdomen., Disp: 30 g, Rfl: 11   lisinopril (ZESTRIL) 20 MG tablet, Take 1 tablet (20 mg total) by mouth daily., Disp: 90 tablet, Rfl: 3   pimecrolimus (ELIDEL) 1 % cream, APPLY   TOPICALLY TO AFFECTED AREA IN GROIN TWICE DAILY, Disp: 60 g, Rfl: 11  Physical exam:  Vitals:   07/27/22 1356 07/27/22 1401  BP: (!) 144/73 133/70  Pulse: 65 70  Resp: 18   Temp: 98 F (36.7 C)   TempSrc: Tympanic   SpO2: 97%   Weight: 241 lb 12.8 oz (109.7 kg)   Height: 5\' 10"  (1.778 m)     Physical Exam Cardiovascular:     Rate and Rhythm: Normal rate and regular rhythm.     Heart sounds: Normal heart sounds.  Pulmonary:     Effort: Pulmonary effort is normal.     Breath sounds: Normal breath sounds.  Abdominal:     General: Bowel sounds are normal.     Palpations: Abdomen is soft.  Skin:    General: Skin is warm and dry.  Neurological:     Mental Status: He is alert and oriented to person, place, and time.         Latest Ref Rng & Units 07/27/2022    1:51 PM  CMP  Glucose 70 - 99 mg/dL 161   BUN 8 - 23 mg/dL 23   Creatinine 0.96 - 1.24 mg/dL 0.45   Sodium 409 - 811 mmol/L 139   Potassium 3.5 - 5.1 mmol/L 3.6   Chloride 98 - 111 mmol/L 106   CO2 22 - 32 mmol/L 24   Calcium 8.9 - 10.3 mg/dL 8.7   Total Protein 6.5 - 8.1 g/dL 6.8   Total Bilirubin 0.3 - 1.2 mg/dL 0.1   Alkaline Phos 38 - 126 U/L 64   AST 15 - 41 U/L 20   ALT 0 - 44 U/L 12       Latest Ref Rng & Units 07/27/2022    1:51 PM  CBC  WBC 4.0 - 10.5 K/uL 7.6   Hemoglobin 13.0 - 17.0 g/dL 91.4   Hematocrit 78.2 - 52.0 % 37.6   Platelets 150 - 400 K/uL 226     Assessment and plan- Patient is a 77 y.o. male  history of nonmetastatic castrate resistant prostate cancer currently on ADT plus apalutamide.  He is here for routine follow-up  Patient is tolerating apalutamide wellWithout any significant side effects.  PSA remains undetectable.  He will receive his next dose of Lupron in 3 months and I will see him then with CBC with differential and CMP and PSA   Visit Diagnosis 1. Malignant neoplasm of prostate (HCC)   2. High risk medication use      Dr. Owens Shark, MD, MPH [facility] at [facility]  Medical Center [phone] 07/29/2022 10:26 AM

## 2022-08-07 DIAGNOSIS — G4733 Obstructive sleep apnea (adult) (pediatric): Secondary | ICD-10-CM | POA: Diagnosis not present

## 2022-08-30 ENCOUNTER — Other Ambulatory Visit: Payer: Self-pay | Admitting: Pharmacist

## 2022-08-30 DIAGNOSIS — C61 Malignant neoplasm of prostate: Secondary | ICD-10-CM

## 2022-08-30 MED ORDER — APALUTAMIDE 60 MG PO TABS
240.0000 mg | ORAL_TABLET | Freq: Every day | ORAL | 5 refills | Status: DC
Start: 2022-08-30 — End: 2023-03-13

## 2022-10-22 ENCOUNTER — Ambulatory Visit: Payer: Self-pay

## 2022-10-22 NOTE — Telephone Encounter (Signed)
Chief Complaint: Dizziness Symptoms: Lightheaded, vomiting, chills  Frequency: 2 episodes  Pertinent Negatives: Patient denies Feeling off balanced, fever, pain Disposition: [] ED /[] Urgent Care (no appt availability in office) / [x] Appointment(In office/virtual)/ []  [facility]/ [] Home Care/ [] Refused Recommended Disposition /[] [facility]/ []  Follow-up with PCP Additional Notes: Patient stated that he felt dizzy and lightheaded about 1 week ago but it was hot outside. On Saturday he was indoors and felt lightheaded and  dizzy again lasting 30-35 minutes. Patient stated that shortly after feeling dizzy he had one episode of vomiting and felt chills. Advised patient that he would need to be evaluated. Patient was agreeable. Patient has been scheduled 10/24/22 with provider.  Summary: Dizzy Spells   Two dizzy spells in the last two weeks, the last one was this past saturday.   (First available appt August 12 for [facility] and August 19 Dr Kirtland Bouchard)     Reason for Disposition  [1] MILD dizziness (e.g., walking normally) AND [2] has NOT been evaluated by doctor (or NP/PA) for this  (Exception: Dizziness caused by heat exposure, sudden standing, or poor fluid intake.)  Answer Assessment - Initial Assessment Questions 1. DESCRIPTION: "Describe your dizziness."     Lightheaded 2. LIGHTHEADED: "Do you feel lightheaded?" (e.g., somewhat faint, woozy, weak upon standing)     woozy 3. VERTIGO: "Do you feel like either you or the room is spinning or tilting?" (i.e. vertigo)     No 4. SEVERITY: "How bad is it?"  "Do you feel like you are going to faint?" "Can you stand and walk?"   - MILD: Feels slightly dizzy, but walking normally.   - MODERATE: Feels unsteady when walking, but not falling; interferes with normal activities (e.g., school, work).   - SEVERE: Unable to walk without falling, or requires assistance to walk without falling; feels like passing out now.      Mild  5. ONSET:  "When  did the dizziness begin?"     1 week ago and again Saturday lasting about 30-35 minutes  6. AGGRAVATING FACTORS: "Does anything make it worse?" (e.g., standing, change in head position)     Standing and the heat  7. HEART RATE: "Can you tell me your heart rate?" "How many beats in 15 seconds?"  (Note: not all patients can do this)       No 8. CAUSE: "What do you think is causing the dizziness?"     Heat and I did not feel going 9. RECURRENT SYMPTOM: "Have you had dizziness before?" If Yes, ask: "When was the last time?" "What happened that time?"     No 10. OTHER SYMPTOMS: "Do you have any other symptoms?" (e.g., fever, chest pain, vomiting, diarrhea, bleeding)       Vomiting, chills  Protocols used: Dizziness - Lightheadedness-A-AH

## 2022-10-24 ENCOUNTER — Encounter: Payer: Self-pay | Admitting: Family Medicine

## 2022-10-24 ENCOUNTER — Ambulatory Visit (INDEPENDENT_AMBULATORY_CARE_PROVIDER_SITE_OTHER): Payer: PPO | Admitting: Family Medicine

## 2022-10-24 VITALS — BP 120/66 | HR 66 | Temp 96.6°F | Wt 240.0 lb

## 2022-10-24 DIAGNOSIS — I10 Essential (primary) hypertension: Secondary | ICD-10-CM

## 2022-10-24 DIAGNOSIS — R42 Dizziness and giddiness: Secondary | ICD-10-CM | POA: Diagnosis not present

## 2022-10-24 NOTE — Progress Notes (Signed)
Subjective:    Patient ID: Eric Bury., male    DOB: [birth year], 77 y.o.   MRN: [id number]  Eric Coby. is a 76 y.o. male presenting on 10/24/2022 for Dizziness (Pt report two episodes.  The first one was a few weeks ago.  It lasted several minutes.  Most recent episode was Saturday (10/20/2022)  This episode lasted longer than the first episode and felt bad afterwards.  (Nausea, vomiting, fatigue.))  Patient presents for a same day appointment.  HPI  Dizzy Spells  1st episode few weeks ago, it was very hot outside, he was working and lifting and got an episode lasted a few minutes.  Last episode he had acute repeat episode this past week Saturday, he had spell that lasted longer, then left with nausea, fatigue, vomiting episode. He had some chills, did not have elevated temperature. Next day he felt better. He did not endorse any chest pain or significant dyspnea. He said his head felt pressure. He denies any motion sickness or vertigo. He did not pass out or have any syncopal episode.  Home BP reading after episode, it was okay.  No repeat episode since, he has been able to do the same activities and tasks without problem.  Prostate Cancer Followed by Oncology, he is on apalutamide and receives injection   Low energy and fatigue  BP change last year, Lisinopril 30 > 20mg . He has done well with this lower dose.      10/24/2022   11:55 AM 05/21/2022    9:47 AM 01/18/2022    9:10 AM  Depression screen PHQ 2/9  Decreased Interest 0 0 0  Down, Depressed, Hopeless 0 0 0  PHQ - 2 Score 0 0 0  Altered sleeping 0 0 0  Tired, decreased energy 2 1 1   Change in appetite 0 0 0  Feeling bad or failure about yourself  0 0 0  Trouble concentrating 0 0 0  Moving slowly or fidgety/restless 0 0 0  Suicidal thoughts 0 0 0  PHQ-9 Score 2 1 1   Difficult doing work/chores Not difficult at all Not difficult at all Not difficult at all    Social History   Tobacco Use   Smoking  status: Former    Current packs/day: 0.00    Average packs/day: 1 pack/day for 26.0 years (26.0 ttl pk-yrs)    Types: Cigarettes    Start date: 03/27/1963    Quit date: 03/26/1989    Years since quitting: 33.6   Smokeless tobacco: Former   Tobacco comments:    less than 1 PPD  Vaping Use   Vaping status: Never Used  Substance Use Topics   Alcohol use: No    Alcohol/week: 0.0 standard drinks of alcohol   Drug use: No    Review of Systems Per HPI unless specifically indicated above     Objective:    BP 120/66 (BP Location: Right Arm, Patient Position: Sitting, Cuff Size: Normal)   Pulse 66   Temp (!) 96.6 F (35.9 C) (Temporal)   Wt 240 lb (108.9 kg)   SpO2 96%   BMI 34.44 kg/m   Wt Readings from Last 3 Encounters:  10/24/22 240 lb (108.9 kg)  07/27/22 241 lb 12.8 oz (109.7 kg)  05/21/22 241 lb 6.4 oz (109.5 kg)    Physical Exam Vitals and nursing note reviewed.  Constitutional:      General: He is not in acute distress.    Appearance: He is  well-developed. He is not diaphoretic.     Comments: Well-appearing, comfortable, cooperative  HENT:     Head: Normocephalic and atraumatic.  Eyes:     General:        Right eye: No discharge.        Left eye: No discharge.     Conjunctiva/sclera: Conjunctivae normal.  Neck:     Thyroid: No thyromegaly.  Cardiovascular:     Rate and Rhythm: Normal rate and regular rhythm.     Pulses: Normal pulses.     Heart sounds: Normal heart sounds. No murmur heard. Pulmonary:     Effort: Pulmonary effort is normal. No respiratory distress.     Breath sounds: Normal breath sounds. No wheezing or rales.  Musculoskeletal:        General: Normal range of motion.     Cervical back: Normal range of motion and neck supple.  Lymphadenopathy:     Cervical: No cervical adenopathy.  Skin:    General: Skin is warm and dry.     Findings: No erythema or rash.  Neurological:     Mental Status: He is alert and oriented to person, place, and  time. Mental status is at baseline.  Psychiatric:        Behavior: Behavior normal.     Comments: Well groomed, good eye contact, normal speech and thoughts       Results for orders placed or performed in visit on 07/27/22  PSA  Result Value Ref Range   Prostatic Specific Antigen <0.01 0.00 - 4.00 ng/mL  Comprehensive metabolic panel  Result Value Ref Range   Sodium 139 135 - 145 mmol/L   Potassium 3.6 3.5 - 5.1 mmol/L   Chloride 106 98 - 111 mmol/L   CO2 24 22 - 32 mmol/L   Glucose, Bld 148 (H) 70 - 99 mg/dL   BUN 23 8 - 23 mg/dL   Creatinine, Ser 7.82 0.61 - 1.24 mg/dL   Calcium 8.7 (L) 8.9 - 10.3 mg/dL   Total Protein 6.8 6.5 - 8.1 g/dL   Albumin 3.9 3.5 - 5.0 g/dL   AST 20 15 - 41 U/L   ALT 12 0 - 44 U/L   Alkaline Phosphatase 64 38 - 126 U/L   Total Bilirubin 0.1 (L) 0.3 - 1.2 mg/dL   GFR, Estimated >95 >62 mL/min   Anion gap 9 5 - 15  CBC with Differential/Platelet  Result Value Ref Range   WBC 7.6 4.0 - 10.5 K/uL   RBC 4.27 4.22 - 5.81 MIL/uL   Hemoglobin 12.7 (L) 13.0 - 17.0 g/dL   HCT 13.0 (L) 86.5 - 78.4 %   MCV 88.1 80.0 - 100.0 fL   MCH 29.7 26.0 - 34.0 pg   MCHC 33.8 30.0 - 36.0 g/dL   RDW 69.6 29.5 - 28.4 %   Platelets 226 150 - 400 K/uL   nRBC 0.0 0.0 - 0.2 %   Neutrophils Relative % 67 %   Neutro Abs 5.1 1.7 - 7.7 K/uL   Lymphocytes Relative 20 %   Lymphs Abs 1.5 0.7 - 4.0 K/uL   Monocytes Relative 7 %   Monocytes Absolute 0.5 0.1 - 1.0 K/uL   Eosinophils Relative 5 %   Eosinophils Absolute 0.3 0.0 - 0.5 K/uL   Basophils Relative 1 %   Basophils Absolute 0.0 0.0 - 0.1 K/uL   Immature Granulocytes 0 %   Abs Immature Granulocytes 0.03 0.00 - 0.07 K/uL      Assessment &  Plan:   Problem List Items Addressed This Visit     Essential hypertension - Primary   Other Visit Diagnoses     Episode of dizziness           Based on your symptoms it sounds like it could be from lower BP, in specific situations.  I don't see or hear any other  specific cause at this time.  It could also be related to Apalutamide side effect, which there is not much we can do with that.  I would suggest HOLDING the Hydrochlorothiazide 12.5mg  for 1-2 weeks, and monitor blood pressure.  Goal < 140 / 90, and check an eye on leg swelling.  If you tolerate this fine and if BP is in range, and legs are not too swollen, perhaps this will be better to avoid repeat episodes.  Otherwise, if you still have issues OR if BP raises too much we may have to go back to it.  We can consider reducing Lisinopril again from 20 to 10mg  daily next move if need.  No orders of the defined types were placed in this encounter.     Follow up plan: Return if symptoms worsen or fail to improve.   Saralyn Pilar, DO [facility] 10/24/2022, 11:39 AM

## 2022-10-24 NOTE — Patient Instructions (Addendum)
Thank you for coming to the office today.  Based on your symptoms it sounds like it could be from lower BP, in specific situations.  I don't see or hear any other specific cause at this time.  It could also be related to Apalutamide side effect, which there is not much we can do with that.  I would suggest HOLDING the Hydrochlorothiazide 12.5mg  for 1-2 weeks, and monitor blood pressure.  Goal < 140 / 90, and check an eye on leg swelling.  If you tolerate this fine and if BP is in range, and legs are not too swollen, perhaps this will be better to avoid repeat episodes.  Otherwise, if you still have issues OR if BP raises too much we may have to go back to it.  We can consider reducing Lisinopril again from 20 to 10mg  daily next move if need.  If you have any significant chest pain that does not go away within 30 minutes, is accompanied by nausea, sweating, shortness of breath, or made worse by activity, this may be evidence of a heart attack, especially if symptoms worsening instead of improving, please call 911 or go directly to the emergency room immediately for evaluation.   Please schedule a Follow-up Appointment to: Return if symptoms worsen or fail to improve.  If you have any other questions or concerns, please feel free to call the office or send a message through MyChart. You may also schedule an earlier appointment if necessary.  Additionally, you may be receiving a survey about your experience at our office within a few days to 1 week by e-mail or mail. We value your feedback.  Saralyn Pilar, DO [facility], [place]

## 2022-10-29 ENCOUNTER — Inpatient Hospital Stay: Payer: PPO | Attending: Oncology

## 2022-10-29 ENCOUNTER — Inpatient Hospital Stay: Payer: PPO

## 2022-10-29 ENCOUNTER — Encounter: Payer: Self-pay | Admitting: Oncology

## 2022-10-29 ENCOUNTER — Inpatient Hospital Stay: Payer: PPO | Admitting: Oncology

## 2022-10-29 VITALS — BP 119/79 | HR 61 | Temp 96.6°F | Resp 18 | Ht 70.0 in | Wt 242.7 lb

## 2022-10-29 DIAGNOSIS — C61 Malignant neoplasm of prostate: Secondary | ICD-10-CM

## 2022-10-29 DIAGNOSIS — Z79899 Other long term (current) drug therapy: Secondary | ICD-10-CM | POA: Insufficient documentation

## 2022-10-29 DIAGNOSIS — Z5181 Encounter for therapeutic drug level monitoring: Secondary | ICD-10-CM | POA: Diagnosis not present

## 2022-10-29 DIAGNOSIS — Z79818 Long term (current) use of other agents affecting estrogen receptors and estrogen levels: Secondary | ICD-10-CM | POA: Diagnosis not present

## 2022-10-29 DIAGNOSIS — Z5111 Encounter for antineoplastic chemotherapy: Secondary | ICD-10-CM | POA: Diagnosis not present

## 2022-10-29 LAB — COMPREHENSIVE METABOLIC PANEL
ALT: 12 U/L (ref 0–44)
AST: 15 U/L (ref 15–41)
Albumin: 4.2 g/dL (ref 3.5–5.0)
Alkaline Phosphatase: 63 U/L (ref 38–126)
Anion gap: 10 (ref 5–15)
BUN: 23 mg/dL (ref 8–23)
CO2: 22 mmol/L (ref 22–32)
Calcium: 9 mg/dL (ref 8.9–10.3)
Chloride: 106 mmol/L (ref 98–111)
Creatinine, Ser: 1.06 mg/dL (ref 0.61–1.24)
GFR, Estimated: >60 mL/min (ref >60)
Glucose, Bld: 124 mg/dL — ABNORMAL HIGH (ref 70–99)
Potassium: 3.8 mmol/L (ref 3.5–5.1)
Sodium: 138 mmol/L (ref 135–145)
Total Bilirubin: 0.2 mg/dL — ABNORMAL LOW (ref 0.3–1.2)
Total Protein: 6.9 g/dL (ref 6.5–8.1)

## 2022-10-29 LAB — CBC WITH DIFFERENTIAL/PLATELET
Abs Immature Granulocytes: 0.04 10*3/uL (ref 0.00–0.07)
Basophils Absolute: 0.1 10*3/uL (ref 0.0–0.1)
Basophils Relative: 1 %
Eosinophils Absolute: 0.2 10*3/uL (ref 0.0–0.5)
Eosinophils Relative: 3 %
HCT: 36.6 % — ABNORMAL LOW (ref 39.0–52.0)
Hemoglobin: 12.4 g/dL — ABNORMAL LOW (ref 13.0–17.0)
Immature Granulocytes: 1 %
Lymphocytes Relative: 19 %
Lymphs Abs: 1.4 10*3/uL (ref 0.7–4.0)
MCH: 30.2 pg (ref 26.0–34.0)
MCHC: 33.9 g/dL (ref 30.0–36.0)
MCV: 89.3 fL (ref 80.0–100.0)
Monocytes Absolute: 0.6 10*3/uL (ref 0.1–1.0)
Monocytes Relative: 8 %
Neutro Abs: 5 10*3/uL (ref 1.7–7.7)
Neutrophils Relative %: 68 %
Platelets: 244 10*3/uL (ref 150–400)
RBC: 4.1 MIL/uL — ABNORMAL LOW (ref 4.22–5.81)
RDW: 13.5 % (ref 11.5–15.5)
WBC: 7.2 10*3/uL (ref 4.0–10.5)
nRBC: 0 % (ref 0.0–0.2)

## 2022-10-29 LAB — PSA: Prostatic Specific Antigen: <0.01 ng/mL (ref 0.00–4.00)

## 2022-10-29 MED ORDER — LEUPROLIDE ACETATE (6 MONTH) 45 MG ~~LOC~~ KIT
45.0000 mg | PACK | Freq: Once | SUBCUTANEOUS | Status: AC
  Administered 2022-10-29: 45 mg via SUBCUTANEOUS
  Filled 2022-10-29: qty 45

## 2022-10-29 NOTE — Progress Notes (Signed)
Hematology/Oncology Consult note [facility]  [phone] [phone]  Patient Care Team: Smitty Cords, DO as PCP - General (Family Medicine) Juanetta Gosling Teresita Madura., MD (Inactive) (Unknown Physician Specialty) Delight Ovens, MD (Inactive) as Consulting Physician (Cardiothoracic Surgery) Clance, Maree Krabbe, MD as Consulting Physician (Pulmonary Disease) Delight Ovens, MD (Inactive) as Consulting Physician (Cardiothoracic Surgery) Clance, Maree Krabbe, MD as Consulting Physician (Pulmonary Disease) Minor, Theadora Rama, RN (Inactive) as Case Manager Creig Hines, MD as Consulting Physician (Hematology and Oncology)   Name of the patient: Eric Stone  [id number]  November 01, 1945   Date of visit: 10/29/22  Diagnosis-castrate resistant nonmetastatic prostate cancer  Chief complaint/ Reason for visit-routine follow-up of prostate cancer on ADT and apalutamide  Heme/Onc history: patient is a 77 year old male who was diagnosed with Gleason 6 adenocarcinoma of the prostateAbout 5 years ago and received radiation treatment.  He has not undergone the surgery for his prostate but has seen Dr. Lonna Cobb in the past.  He was receiving intermittent ADT and last received 45 mg of Eligard on 09/30/2018.  His PSA following that went down to 0.87 December 2020.  He did not receive any ADT after that and his most recent PSA on 09/24/2019 was elevated at 5.08.  Patient reports having significant hot flashes and fatigue when he gets ADT.    Patient presently on continuous ADT since July 2021 which she gets every 6 months.  Bone scan did not show any evidence of metastatic disease.   Patient noted to have PSA doubling time of about 3 months in August 2022.  CT abdomen and bone scan did not show any evidence of distant metastatic disease.Patient started on apalutamide for castrate resistant nonmetastatic prostate cancer in August 2022    Interval history-patient does  report ongoing fatigue on apalutamide.  Hot flashes are overall self-limited.  He did have 2 disease spells in the last 1 month.  He was seen by his primary care doctor and Hydro chlorothiazide was stopped.  ECOG PS- 1 Pain scale- 0   Review of systems- Review of Systems  Constitutional:  Positive for malaise/fatigue. Negative for chills, fever and weight loss.  HENT:  Negative for congestion, ear discharge and nosebleeds.   Eyes:  Negative for blurred vision.  Respiratory:  Negative for cough, hemoptysis, sputum production, shortness of breath and wheezing.   Cardiovascular:  Negative for chest pain, palpitations, orthopnea and claudication.  Gastrointestinal:  Negative for abdominal pain, blood in stool, constipation, diarrhea, heartburn, melena, nausea and vomiting.  Genitourinary:  Negative for dysuria, flank pain, frequency, hematuria and urgency.  Musculoskeletal:  Negative for back pain, joint pain and myalgias.  Skin:  Negative for rash.  Neurological:  Negative for dizziness, tingling, focal weakness, seizures, weakness and headaches.  Endo/Heme/Allergies:  Does not bruise/bleed easily.  Psychiatric/Behavioral:  Negative for depression and suicidal ideas. The patient does not have insomnia.       Allergies  Allergen Reactions   Penicillins Other (See Comments)    As a child     Past Medical History:  Diagnosis Date   Arthritis    H/O blood clots    H/O blood clots    in left leg in 1988   Hx of dysplastic nevus 06/18/2013   L lat ankle - mild   Melanoma (HCC) 02/21/2016   L prox med plantar great toe - Breslow's 1.39mm, Clark level early 4   Prostate cancer (HCC)    Rash  occasionally and pt does see a dermatologist   Sleep apnea    uses a CPAP;sleep study >76yrs ago     Past Surgical History:  Procedure Laterality Date   TOE SURGERY     melanoma   TOTAL KNEE ARTHROPLASTY  2009-2010   bilateral   VIDEO ASSISTED THORACOSCOPY (VATS)/DECORTICATION Left  05/06/2012   Procedure: VIDEO ASSISTED THORACOSCOPY (VATS) drainage loculated pleural effusion;  Surgeon: Delight Ovens, MD;  Location: [facility] OR;  Service: Thoracic;  Laterality: Left;   VIDEO BRONCHOSCOPY N/A 05/06/2012   Procedure: VIDEO BRONCHOSCOPY;  Surgeon: Delight Ovens, MD;  Location: [facility] OR;  Service: Thoracic;  Laterality: N/A;    Social History   Socioeconomic History   Marital status: Married    Spouse name: Not on file   Number of children: Not on file   Years of education: tech school   Highest education level: High school graduate  Occupational History   Occupation: retired  Tobacco Use   Smoking status: Former    Current packs/day: 0.00    Average packs/day: 1 pack/day for 26.0 years (26.0 ttl pk-yrs)    Types: Cigarettes    Start date: 03/27/1963    Quit date: 03/26/1989    Years since quitting: 33.6   Smokeless tobacco: Former   Tobacco comments:    less than 1 PPD  Vaping Use   Vaping status: Never Used  Substance and Sexual Activity   Alcohol use: No    Alcohol/week: 0.0 standard drinks of alcohol   Drug use: No   Sexual activity: Yes  Other Topics Concern   Not on file  Social History Narrative   Not on file   Social Determinants of Health   Financial Resource Strain: Low Risk  (01/07/2018)   Overall Financial Resource Strain (CARDIA)    Difficulty of Paying Living Expenses: Not hard at all  Food Insecurity: No Food Insecurity (01/07/2018)   Hunger Vital Sign    Worried About Running Out of Food in the Last Year: Never true    Ran Out of Food in the Last Year: Never true  Transportation Needs: No Transportation Needs (01/07/2018)   PRAPARE - Administrator, Civil Service (Medical): No    Lack of Transportation (Non-Medical): No  Physical Activity: Inactive (01/07/2018)   Exercise Vital Sign    Days of Exercise per Week: 0 days    Minutes of Exercise per Session: 0 min  Stress: No Stress Concern Present (01/07/2018)   [employer] of Occupational Health - Occupational Stress Questionnaire    Feeling of Stress : Not at all  Social Connections: Moderately Integrated (01/07/2018)   Social Connection and Isolation Panel [NHANES]    Frequency of Communication with Friends and Family: More than three times a week    Frequency of Social Gatherings with Friends and Family: More than three times a week    Attends Religious Services: More than 4 times per year    Active Member of [employer] or Organizations: No    Attends Banker Meetings: Never    Marital Status: Married  Catering manager Violence: Not At Risk (01/07/2018)   Humiliation, Afraid, Rape, and Kick questionnaire    Fear of Current or Ex-Partner: No    Emotionally Abused: No    Physically Abused: No    Sexually Abused: No    Family History  Problem Relation Age of Onset   Hypertension Mother    Stroke Mother  possible   Cancer Brother      Current Outpatient Medications:    apalutamide (ERLEADA) 60 MG tablet, Take 4 tablets (240 mg total) by mouth daily., Disp: 120 tablet, Rfl: 5   aspirin EC 81 MG tablet, Take 81 mg by mouth., Disp: , Rfl:    atorvastatin (LIPITOR) 10 MG tablet, Take 1 tablet (10 mg total) by mouth at bedtime., Disp: 90 tablet, Rfl: 3   hydrocortisone 2.5 % cream, Apply T, TH, S at bedtime to rash at abdomen., Disp: 30 g, Rfl: 11   ketoconazole (NIZORAL) 2 % cream, Apply M, W, F at bedtime to rash at abdomen., Disp: 30 g, Rfl: 11   lisinopril (ZESTRIL) 20 MG tablet, Take 1 tablet (20 mg total) by mouth daily., Disp: 90 tablet, Rfl: 3   pimecrolimus (ELIDEL) 1 % cream, APPLY   TOPICALLY TO AFFECTED AREA IN GROIN TWICE DAILY, Disp: 60 g, Rfl: 11   hydrochlorothiazide (HYDRODIURIL) 12.5 MG tablet, Take 1 tablet by mouth once daily (Patient not taking: Reported on 10/29/2022), Disp: 90 tablet, Rfl: 1  Physical exam:  Vitals:   10/29/22 1339  BP: 119/79  Pulse: 61  Resp: 18  Temp: (!) 96.6 F (35.9 C)  TempSrc:  Tympanic  SpO2: 98%  Weight: 242 lb 11.2 oz (110.1 kg)  Height: 5\' 10"  (1.778 m)   Physical Exam Cardiovascular:     Rate and Rhythm: Normal rate and regular rhythm.     Heart sounds: Normal heart sounds.  Pulmonary:     Effort: Pulmonary effort is normal.     Breath sounds: Normal breath sounds.  Musculoskeletal:     Right lower leg: No edema.     Left lower leg: No edema.  Skin:    General: Skin is warm and dry.  Neurological:     Mental Status: He is alert and oriented to person, place, and time.         Latest Ref Rng & Units 10/29/2022    1:29 PM  CMP  Glucose 70 - 99 mg/dL 098   BUN 8 - 23 mg/dL 23   Creatinine 1.19 - 1.24 mg/dL 1.47   Sodium 829 - 562 mmol/L 138   Potassium 3.5 - 5.1 mmol/L 3.8   Chloride 98 - 111 mmol/L 106   CO2 22 - 32 mmol/L 22   Calcium 8.9 - 10.3 mg/dL 9.0   Total Protein 6.5 - 8.1 g/dL 6.9   Total Bilirubin 0.3 - 1.2 mg/dL 0.2   Alkaline Phos 38 - 126 U/L 63   AST 15 - 41 U/L 15   ALT 0 - 44 U/L 12       Latest Ref Rng & Units 10/29/2022    1:29 PM  CBC  WBC 4.0 - 10.5 K/uL 7.2   Hemoglobin 13.0 - 17.0 g/dL 13.0   Hematocrit 86.5 - 52.0 % 36.6   Platelets 150 - 400 K/uL 244      Assessment and plan- Patient is a 77 y.o. male with history of castrate resistant nonmetastatic prostate cancer currently on ADT plus apalutamide here for routine follow-up  Patient was started on apalutamide about 2 years ago and since then his PSA has been undetectable.  PSA from today is pending.  He continues to be on ADT every 6 months.  He will get his next dose of Lupron today.  Ideally patient needs to continue apalutamide until progression or toxicity.  I do not think that his recent disease spells are  related to the drug since he has been on the drug for 2 years now.  However combined ADT plus apalutamide can lead to worsening fatigue and poor quality of life.  We could consider stopping apalutamide if his quality of life continues to worsen.  I will  reassess his symptoms in 3 months time.  His baseline bone density scan in January 2023 was within normal limits I will consider repeating 1 in January of next year   Visit Diagnosis 1. Malignant neoplasm of prostate (HCC)   2. High risk medication use   3. Encounter for monitoring Lupron therapy      Dr. Owens Shark, MD, MPH [facility] [phone] 10/29/2022 3:46 PM

## 2022-11-02 ENCOUNTER — Ambulatory Visit: Payer: Self-pay

## 2022-11-02 NOTE — Telephone Encounter (Signed)
noted 

## 2022-11-02 NOTE — Telephone Encounter (Signed)
Message from Canute T sent at 11/02/2022  9:40 AM EDT  Summary: medication question   Patient called stated he stopped taking hydrochlorothiazide (HYDRODIURIL) 12.5 MG tablet 2 weeks ago per Dr Kirtland Bouchard orders and he has not had a dizzy spell since. Patient is asking if he should go back on the medication or stay off. Please f/u with patient  ----- Message from Manley T sent at 11/02/2022  9:38 AM EDT ----- Patient called stated he stopped taking hydrochlorothiazide (HYDRODIURIL) 12.5 MG tablet 2 weeks ago per Dr Kirtland Bouchard orders and he has not had a dizzy spell since. Patient is asking if he should go back on the medication or stay off. Please f/u with patient        Called pt and LM on VM  to call back.

## 2022-11-02 NOTE — Telephone Encounter (Signed)
Reason for Disposition . Caller has medicine question only, adult not sick, AND triager answers question  Answer Assessment - Initial Assessment Questions 1. NAME of MEDICINE: "What medicine(s) are you calling about?"     Hydrochlorothiazide- update since stopping 2. QUESTION: "What is your question?" (e.g., double dose of medicine, side effect)     Patient states PCP asked him to call with status/update since stopping hydrochlorothiazide. Patient reports no dizzy spells. Patient states BP had been good- sometimes higher than others- but not out of normal range- 119/79 at Monday appointment with another provider. Patient report swelling is not as much as it has been in the past- not any worse.  3. PRESCRIBER: "Who prescribed the medicine?" Reason: if prescribed by specialist, call should be referred to that group.     PCP Patient advised provider is out of office until Monday- I will forward his call and let provider know. Patient advise to continue to monitor BP and symptoms until he gets further instruction from provider next week.  Protocols used: Medication Question Call-A-AH

## 2022-11-02 NOTE — Telephone Encounter (Signed)
  Chief Complaint: Patient was asked to call PCP with response to stopping medication- HCTZ Symptoms: Reports: no dizziness, BP WNL per patient, swelling is present- but not as bad and certainly not any worse  Disposition: [] ED /[] Urgent Care (no appt availability in office) / [] Appointment(In office/virtual)/ []  [place] [facility]/ [] Home Care/ [] Refused Recommended Disposition /[] [place] [facility]/ [x]  Follow-up with PCP Additional Notes: PCP is out of town this week- will send patient response to provider for follow up and nest steps- Patient inquiring if he needs cariology referral.

## 2022-11-02 NOTE — Telephone Encounter (Signed)
Message from Mitchell T sent at 11/02/2022  9:40 AM EDT  Summary: medication question   Patient called stated he stopped taking hydrochlorothiazide (HYDRODIURIL) 12.5 MG tablet 2 weeks ago per Dr Kirtland Bouchard orders and he has not had a dizzy spell since. Patient is asking if he should go back on the medication or stay off. Please f/u with patient        Called pt and LM on VM to call back to discuss sx.

## 2022-11-02 NOTE — Telephone Encounter (Addendum)
Message from Ozora T sent at 11/02/2022  9:40 AM EDT  Summary: medication question   Patient called stated he stopped taking hydrochlorothiazide (HYDRODIURIL) 12.5 MG tablet 2 weeks ago per Dr Kirtland Bouchard orders and he has not had a dizzy spell since. Patient is asking if he should go back on the medication or stay off. Please f/u with patient        Third attempt to reach pt and no contact. Routing to office.  Reason for Disposition  Third attempt to contact caller AND no contact made. Phone number verified.  Protocols used: No Contact or Duplicate Contact Call-A-AH

## 2022-11-08 ENCOUNTER — Telehealth: Payer: Self-pay

## 2022-11-08 NOTE — Addendum Note (Signed)
Addended by: Smitty Cords on: 11/08/2022 03:51 PM   Modules accepted: Orders

## 2022-11-08 NOTE — Telephone Encounter (Signed)
Please notify patient to discontinue Hydrochlorothiazide 12.5mg  since the dizzy spells have resolved. This may have been causing it as a side effect.  He may need to notify his pharmacy to not fill it again.  If his BP remains elevated we can follow-up at future visit.  Saralyn Pilar, DO [facility] 11/08/2022, 3:50 PM

## 2022-11-08 NOTE — Telephone Encounter (Signed)
Looks like pt called while you were out of the office.  Telephone encounter from 11/02/2022:  Patient called stated he stopped taking hydrochlorothiazide (HYDRODIURIL) 12.5 MG tablet 2 weeks ago per Dr Kirtland Bouchard orders and he has not had a dizzy spell since. Patient is asking if he should go back on the medication or stay off. Please f/u with patient      Copied from CRM [id number]. Topic: General - Inquiry >> Nov 08, 2022  3:17 PM De Blanch wrote: Reason for CRM:Pt is calling to f/u on medication question; please see TE from NT from 11/02/2022.  Stated he is still waiting on instructions from Dr. Kirtland Bouchard.   Pt is requesting a callback.

## 2022-11-09 NOTE — Telephone Encounter (Signed)
Called pt was able to speak to his wife and she was going to relay PCP message. He will stop the medicine and follow up sooner if bp is elevated >140/90.

## 2022-12-13 ENCOUNTER — Other Ambulatory Visit: Payer: PPO

## 2023-01-14 ENCOUNTER — Other Ambulatory Visit: Payer: PPO

## 2023-01-14 LAB — HM DIABETES EYE EXAM

## 2023-01-15 ENCOUNTER — Other Ambulatory Visit: Payer: Self-pay

## 2023-01-15 ENCOUNTER — Encounter: Payer: Self-pay | Admitting: Family Medicine

## 2023-01-15 DIAGNOSIS — Z0000 Encounter for general adult medical examination without abnormal findings: Secondary | ICD-10-CM

## 2023-01-15 DIAGNOSIS — I10 Essential (primary) hypertension: Secondary | ICD-10-CM

## 2023-01-15 DIAGNOSIS — Z79899 Other long term (current) drug therapy: Secondary | ICD-10-CM

## 2023-01-15 DIAGNOSIS — E1169 Type 2 diabetes mellitus with other specified complication: Secondary | ICD-10-CM

## 2023-01-16 ENCOUNTER — Other Ambulatory Visit: Payer: PPO

## 2023-01-16 DIAGNOSIS — E785 Hyperlipidemia, unspecified: Secondary | ICD-10-CM | POA: Diagnosis not present

## 2023-01-16 DIAGNOSIS — E1169 Type 2 diabetes mellitus with other specified complication: Secondary | ICD-10-CM | POA: Diagnosis not present

## 2023-01-16 DIAGNOSIS — Z0000 Encounter for general adult medical examination without abnormal findings: Secondary | ICD-10-CM | POA: Diagnosis not present

## 2023-01-16 DIAGNOSIS — I10 Essential (primary) hypertension: Secondary | ICD-10-CM | POA: Diagnosis not present

## 2023-01-17 LAB — LIPID PANEL
Cholesterol: 164 mg/dL (ref <200)
HDL: 55 mg/dL (ref >=40)
LDL Cholesterol (Calc): 89 mg/dL
Non-HDL Cholesterol (Calc): 109 mg/dL (ref <130)
Total CHOL/HDL Ratio: 3 (calc) (ref <5.0)
Triglycerides: 106 mg/dL (ref <150)

## 2023-01-17 LAB — BASIC METABOLIC PANEL WITH GFR
BUN: 16 mg/dL (ref 7–25)
CO2: 26 mmol/L (ref 20–32)
Calcium: 9.4 mg/dL (ref 8.6–10.3)
Chloride: 104 mmol/L (ref 98–110)
Creat: 1.04 mg/dL (ref 0.70–1.28)
Glucose, Bld: 130 mg/dL — ABNORMAL HIGH (ref 65–99)
Potassium: 4.4 mmol/L (ref 3.5–5.3)
Sodium: 141 mmol/L (ref 135–146)
eGFR: 75 mL/min/{1.73_m2} (ref >=60)

## 2023-01-17 LAB — HEMOGLOBIN A1C
Hgb A1c MFr Bld: 7 %{Hb} — ABNORMAL HIGH (ref <5.7)
Mean Plasma Glucose: 154 mg/dL
eAG (mmol/L): 8.5 mmol/L

## 2023-01-17 LAB — TSH: TSH: 1.34 m[IU]/L (ref 0.40–4.50)

## 2023-01-21 ENCOUNTER — Encounter: Payer: Self-pay | Admitting: Family Medicine

## 2023-01-21 ENCOUNTER — Ambulatory Visit (INDEPENDENT_AMBULATORY_CARE_PROVIDER_SITE_OTHER): Payer: PPO | Admitting: Family Medicine

## 2023-01-21 VITALS — BP 140/70 | HR 58 | Ht 70.0 in | Wt 244.2 lb

## 2023-01-21 DIAGNOSIS — I10 Essential (primary) hypertension: Secondary | ICD-10-CM | POA: Diagnosis not present

## 2023-01-21 DIAGNOSIS — Z0000 Encounter for general adult medical examination without abnormal findings: Secondary | ICD-10-CM | POA: Diagnosis not present

## 2023-01-21 DIAGNOSIS — Z23 Encounter for immunization: Secondary | ICD-10-CM | POA: Diagnosis not present

## 2023-01-21 DIAGNOSIS — Z1211 Encounter for screening for malignant neoplasm of colon: Secondary | ICD-10-CM | POA: Diagnosis not present

## 2023-01-21 DIAGNOSIS — G4733 Obstructive sleep apnea (adult) (pediatric): Secondary | ICD-10-CM | POA: Diagnosis not present

## 2023-01-21 DIAGNOSIS — E785 Hyperlipidemia, unspecified: Secondary | ICD-10-CM

## 2023-01-21 DIAGNOSIS — C61 Malignant neoplasm of prostate: Secondary | ICD-10-CM | POA: Diagnosis not present

## 2023-01-21 DIAGNOSIS — E1169 Type 2 diabetes mellitus with other specified complication: Secondary | ICD-10-CM

## 2023-01-21 MED ORDER — LISINOPRIL 30 MG PO TABS: 30.0000 mg | ORAL_TABLET | Freq: Every day | ORAL | 3 refills | Status: DC

## 2023-01-21 NOTE — Patient Instructions (Addendum)
Thank you for coming to the office today.  Recent Labs    05/21/22 0920 01/16/23 0900  HGBA1C 7.0* 7.0*   Flu Shot today  Ordered the Cologuard (home kit) test for colon cancer screening. Stay tuned for further updates.  It will be shipped to you directly. If not received in 2-4 weeks, call us or the company.   If you send it back and no results are received in 2-4 weeks, call us or the company as well!   Colon Cancer Screening: - For all adults age 46+ routine colon cancer screening is highly recommended.     - Recent guidelines from American [employer] Society recommend starting age of 40 - Early detection of colon cancer is important, because often there are no warning signs or symptoms, also if found early usually it can be cured. Late stage is hard to treat.   - If Cologuard is NEGATIVE, then it is good for 3 years before next due - If Cologuard is POSITIVE, then it is strongly advised to get a Colonoscopy, which allows the GI doctor to locate the source of the cancer or polyp (even very early stage) and treat it by removing it. ------------------------- Follow instructions to collect sample, you may call the company for any help or questions, 24/7 telephone support at [phone].    Please schedule a Follow-up Appointment to: Return in about 6 months (around 07/22/2023) for 6 month DM A1c (AM preferred, any day).  If you have any other questions or concerns, please feel free to call the office or send a message through MyChart. You may also schedule an earlier appointment if necessary.  Additionally, you may be receiving a survey about your experience at our office within a few days to 1 week by e-mail or mail. We value your feedback.  Saralyn Pilar, DO [facility], [place]

## 2023-01-21 NOTE — Assessment & Plan Note (Signed)
Mild elevated BP off thiazide CKD II, improved since off thiazide OFF Furosemide HCTZ    Plan:  Remain OFF hydrochlorothiazide Dose inc Lisinopril from 20 to 30mg  new rx Encourage improved lifestyle - low sodium diet, improve regular exercise Continue monitor BP outside office, bring readings to next visit, if persistently >140/90 or new symptoms notify office sooner

## 2023-01-21 NOTE — Assessment & Plan Note (Signed)
A1c stable at 7.0, No hypoglycemia or hyperglycemia Complications - hyperlipidemia, OSA - increases risk of future cardiovascular complications   Plan:  - Not on medication for DM, it is diet controlled - Encourage improved lifestyle - low carb, low sugar diet, reduce portion size, continue improving regular exercise - May continue chronic case management for comprehensive nursing education on diabetes among other benefits - Continue ASA, ACEi, Statin DM Foot and Urine microalbumin today  Updated DM Eye Dr Eric Stone

## 2023-01-21 NOTE — Assessment & Plan Note (Signed)
Followed by Dr Coralyn Helling Onc S/p rad therapy, no surgical removal Stable monitoring PSA per Oncology PSA has responded to chemo / injection - improved on repeat < 0.04 Next apt 1 week

## 2023-01-21 NOTE — Assessment & Plan Note (Signed)
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 

## 2023-01-21 NOTE — Assessment & Plan Note (Signed)
Controlled cholesterol on lifestyle  Plan: 1. Continue Atorvastatin '10mg'$  nightly 2. Continue ASA '81mg'$  for primary ASCVD risk reduction 3. Encourage improved lifestyle - low carb/cholesterol, reduce portion size, continue improving regular exercise

## 2023-01-21 NOTE — Progress Notes (Signed)
Subjective:    Patient ID: Eric Bury., male    DOB: 1945-08-23, 77 y.o.   MRN: [id number]  Eric Sanson. is a 77 y.o. male presenting on 01/21/2023 for Annual Exam   HPI  Here for Annual Physical and Lab Review  Discussed the use of AI scribe software for clinical note transcription with the patient, who gave verbal consent to proceed.  The patient, aged 77, presented for a routine follow-up visit. He reported no new health issues since the last visit. The patient has a history of diabetes, hypertension, and prostate cancer.   CHRONIC HTN: CKD II Previously taken off hydrochlorothiazide diuretic. He has had improved kidney function. Last lab improved Cr 1.04 However he has had elevated BP mild 140s. Interested in med adjust. Previously on Lisinopril 30mg  Controlled. Not checking regularly now. Current Meds - Lisinopril 20mg  daily, HCTZ 12.5mg  daily Reports good compliance, took meds today. Tolerating well, w/o complaints. - Admits mild varicose veins edema, but improved on compression stocking - stable after off thiazide Denies CP, dyspnea, HA, dizziness / lightheadedness   Type 2 Diabetes A1c stable at 7.0, same as last time 8 months ago Feb 2024 CBGs Never checked. Meds: never on meds Currently on ACEi Lifestyle: Weight up 4 lbs in past 4+ months - Diet (Trying to maintain improved diet, reduce carbs portions, inc water) - Exercise (Improved activity and exercise) - S/p L Great Toe Amputation Completed Diabetic Eye Exam 01/14/23 Dr Beverely Pace in [place] Denies hypoglycemia, polyuria, visual changes, numbness or tingling.   HYPERLIPIDEMIA: Last lab LDL 89, controlled - Currently taking Rosuvastatin 10mg , tolerating well without side effects or myalgias   OSA, on CPAP - Patient reports prior history of dx OSA and on CPAP for >20 years, prior to treatment his initial symptoms were snoring, daytime sleepiness and fatigue, he has had several sleep studies in the past.  Previously dx mild to moderate OSA. - He reports that his sleep apnea is well controlled. He uses the CPAP machine every night. He tolerates the machine well, and thinks that he sleeps better with it and feels good. No new concerns or symptoms.    PMH - Melanoma, Left big toe / History of Skin Cancer S/p amputation - [facility] by [facility]   PMH Chronic Low Back Pain, with osteoarthritis Has seen Van Clines PA [facility], had x-rays, copied below of lumbar spine, has gradually improved, advanced OA/DJD, no other intervention. He can return if worsening.   S/p prostate cancer Followed by Dr Smith Robert and Dr Rushie Chestnut for Prostate Cancer Last report updated 04/27/22 with PSA still undetectable < 0.01 on therapy. 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 Due for repeat Cologuard, ordered today     01/21/2023    9:10 AM 10/24/2022   11:55 AM 05/21/2022    9:47 AM  Depression screen PHQ 2/9  Decreased Interest 0 0 0  Down, Depressed, Hopeless 0 0 0  PHQ - 2 Score 0 0 0  Altered sleeping 0 0 0  Tired, decreased energy 0 2 1  Change in appetite 0 0 0  Feeling bad or failure about yourself  0 0 0  Trouble concentrating 0 0 0  Moving slowly or fidgety/restless 0 0 0  Suicidal thoughts 0 0 0  PHQ-9 Score 0 2  1  Difficult doing work/chores Not difficult at all Not difficult at all Not difficult at all    Past Medical History:  Diagnosis Date   Arthritis    H/O blood clots    H/O blood clots    in left leg in 1988   Hx of dysplastic nevus 06/18/2013   L lat ankle - mild   Melanoma (HCC) 02/21/2016   L prox med plantar great toe - Breslow's 1.60mm, Clark level early 4   Prostate cancer (HCC)    Rash    occasionally and pt does see a dermatologist   Sleep apnea    uses a CPAP;sleep study >69yrs ago   Past  Surgical History:  Procedure Laterality Date   TOE SURGERY     melanoma   TOTAL KNEE ARTHROPLASTY  2009-2010   bilateral   VIDEO ASSISTED THORACOSCOPY (VATS)/DECORTICATION Left 05/06/2012   Procedure: VIDEO ASSISTED THORACOSCOPY (VATS) drainage loculated pleural effusion;  Surgeon: Delight Ovens, MD;  Location: [facility] OR;  Service: Thoracic;  Laterality: Left;   VIDEO BRONCHOSCOPY N/A 05/06/2012   Procedure: VIDEO BRONCHOSCOPY;  Surgeon: Delight Ovens, MD;  [facility];  Service: Thoracic;  Laterality: N/A;   Social History   Socioeconomic History   Marital status: Married    Spouse name: Not on file   Number of children: Not on file   Years of education: tech school   Highest education level: High school graduate  Occupational History   Occupation: retired  Tobacco Use   Smoking status: Former    Current packs/day: 0.00    Average packs/day: 1 pack/day for 26.0 years (26.0 ttl pk-yrs)    Types: Cigarettes    Start date: 03/27/1963    Quit date: 03/26/1989    Years since quitting: 33.8   Smokeless tobacco: Former   Tobacco comments:    less than 1 PPD  Vaping Use   Vaping status: Never Used  Substance and Sexual Activity   Alcohol use: No    Alcohol/week: 0.0 standard drinks of alcohol   Drug use: No   Sexual activity: Yes  Other Topics Concern   Not on file  Social History Narrative   Not on file   Social Determinants of Health   Financial Resource Strain: Low Risk  (01/07/2018)   Overall Financial Resource Strain (CARDIA)    Difficulty of Paying Living Expenses: Not hard at all  Food Insecurity: No Food Insecurity (01/07/2018)   Hunger Vital Sign    Worried About Running Out of Food in the Last Year: Never true    Ran Out of Food in the Last Year: Never true  Transportation Needs: No Transportation Needs (01/07/2018)   PRAPARE - Administrator, Civil Service (Medical): No    Lack of Transportation (Non-Medical): No  Physical Activity: Inactive  (01/07/2018)   Exercise Vital Sign    Days of Exercise per Week: 0 days    Minutes of Exercise per Session: 0 min  Stress: No Stress Concern Present (01/07/2018)   [employer] of Occupational Health - Occupational Stress Questionnaire    Feeling of Stress : Not at all  Social Connections: Moderately Integrated (01/07/2018)   Social Connection and Isolation Panel [NHANES]    Frequency of Communication with Friends and Family: More than three times a week    Frequency of Social Gatherings with Friends and Family: More than three times a week    Attends Religious Services: More than 4 times per year  Active Member of Clubs or Organizations: No    Attends Banker Meetings: Never    Marital Status: Married  Catering manager Violence: Not At Risk (01/07/2018)   Humiliation, Afraid, Rape, and Kick questionnaire    Fear of Current or Ex-Partner: No    Emotionally Abused: No    Physically Abused: No    Sexually Abused: No   Family History  Problem Relation Age of Onset   Hypertension Mother    Stroke Mother        possible   Cancer Brother    Current Outpatient Medications on File Prior to Visit  Medication Sig   apalutamide (ERLEADA) 60 MG tablet Take 4 tablets (240 mg total) by mouth daily.   aspirin EC 81 MG tablet Take 81 mg by mouth.   atorvastatin (LIPITOR) 10 MG tablet Take 1 tablet (10 mg total) by mouth at bedtime.   hydrocortisone 2.5 % cream Apply T, TH, S at bedtime to rash at abdomen.   ketoconazole (NIZORAL) 2 % cream Apply M, W, F at bedtime to rash at abdomen.   pimecrolimus (ELIDEL) 1 % cream APPLY   TOPICALLY TO AFFECTED AREA IN GROIN TWICE DAILY   No current facility-administered medications on file prior to visit.    Review of Systems  Constitutional:  Negative for activity change, appetite change, chills, diaphoresis, fatigue and fever.  HENT:  Negative for congestion and hearing loss.   Eyes:  Negative for visual disturbance.   Respiratory:  Negative for cough, chest tightness, shortness of breath and wheezing.   Cardiovascular:  Negative for chest pain, palpitations and leg swelling.  Gastrointestinal:  Negative for abdominal pain, constipation, diarrhea, nausea and vomiting.  Genitourinary:  Negative for dysuria, frequency and hematuria.  Musculoskeletal:  Negative for arthralgias and neck pain.  Skin:  Negative for rash.  Neurological:  Negative for dizziness, weakness, light-headedness, numbness and headaches.  Hematological:  Negative for adenopathy.  Psychiatric/Behavioral:  Negative for behavioral problems, dysphoric mood and sleep disturbance.    Per HPI unless specifically indicated above      Objective:    BP (!) 140/70 (BP Location: Left Arm, Cuff Size: Normal)   Pulse (!) 58   Ht 5\' 10"  (1.778 m)   Wt 244 lb 3.2 oz (110.8 kg)   SpO2 97%   BMI 35.04 kg/m   Wt Readings from Last 3 Encounters:  01/21/23 244 lb 3.2 oz (110.8 kg)  10/29/22 242 lb 11.2 oz (110.1 kg)  10/24/22 240 lb (108.9 kg)    Physical Exam Vitals and nursing note reviewed.  Constitutional:      General: He is not in acute distress.    Appearance: He is well-developed. He is obese. He is not diaphoretic.     Comments: Well-appearing, comfortable, cooperative  HENT:     Head: Normocephalic and atraumatic.  Eyes:     General:        Right eye: No discharge.        Left eye: No discharge.     Conjunctiva/sclera: Conjunctivae normal.     Pupils: Pupils are equal, round, and reactive to light.  Neck:     Thyroid: No thyromegaly.  Cardiovascular:     Rate and Rhythm: Normal rate and regular rhythm.     Pulses: Normal pulses.     Heart sounds: Normal heart sounds. No murmur heard. Pulmonary:     Effort: Pulmonary effort is normal. No respiratory distress.     Breath sounds: Normal  breath sounds. No wheezing or rales.  Abdominal:     General: Bowel sounds are normal. There is no distension.     Palpations: Abdomen is  soft. There is no mass.     Tenderness: There is no abdominal tenderness.  Musculoskeletal:        General: No tenderness. Normal range of motion.     Cervical back: Normal range of motion and neck supple.     Right lower leg: No edema.     Left lower leg: No edema.     Comments: Upper / Lower Extremities: - Normal muscle tone, strength bilateral upper extremities 5/5, lower extremities 5/5  Lymphadenopathy:     Cervical: No cervical adenopathy.  Skin:    General: Skin is warm and dry.     Findings: No erythema or rash.  Neurological:     Mental Status: He is alert and oriented to person, place, and time.     Comments: Distal sensation intact to light touch all extremities  Psychiatric:        Mood and Affect: Mood normal.        Behavior: Behavior normal.        Thought Content: Thought content normal.     Comments: Well groomed, good eye contact, normal speech and thoughts     Diabetic Foot Exam - Simple   Simple Foot Form Diabetic Foot exam was performed with the following findings: Yes 01/21/2023  9:42 AM  Visual Inspection See comments: Yes Sensation Testing Intact to touch and monofilament testing bilaterally: Yes Pulse Check Posterior Tibialis and Dorsalis pulse intact bilaterally: Yes Comments Bilateral varicose veins and some edema localized. Left foot s/p great toe amputation well healed. No other obvious deformity. Intact sensation to monofilament.      Results for orders placed or performed in visit on 01/15/23  BASIC METABOLIC PANEL WITH GFR  Result Value Ref Range   Glucose, Bld 130 (H) 65 - 99 mg/dL   BUN 16 7 - 25 mg/dL   Creat 0.93 2.35 - 5.73 mg/dL   eGFR 75 > OR = 60 UK/GUR/4.27C6   BUN/Creatinine Ratio SEE NOTE: 6 - 22 (calc)   Sodium 141 135 - 146 mmol/L   Potassium 4.4 3.5 - 5.3 mmol/L   Chloride 104 98 - 110 mmol/L   CO2 26 20 - 32 mmol/L   Calcium 9.4 8.6 - 10.3 mg/dL  TSH  Result Value Ref Range   TSH 1.34 0.40 - 4.50 mIU/L  Hemoglobin  A1c  Result Value Ref Range   Hgb A1c MFr Bld 7.0 (H) <5.7 % of total Hgb   Mean Plasma Glucose 154 mg/dL   eAG (mmol/L) 8.5 mmol/L  Lipid panel  Result Value Ref Range   Cholesterol 164 <200 mg/dL   HDL 55 > OR = 40 mg/dL   Triglycerides 237 <628 mg/dL   LDL Cholesterol (Calc) 89 mg/dL (calc)   Total CHOL/HDL Ratio 3.0 <5.0 (calc)   Non-HDL Cholesterol (Calc) 109 <130 mg/dL (calc)      Assessment & Plan:   Problem List Items Addressed This Visit     Essential hypertension    Mild elevated BP off thiazide CKD II, improved since off thiazide OFF Furosemide HCTZ    Plan:  Remain OFF hydrochlorothiazide Dose inc Lisinopril from 20 to 30mg  new rx Encourage improved lifestyle - low sodium diet, improve regular exercise Continue monitor BP outside office, bring readings to next visit, if persistently >140/90 or new symptoms notify office  sooner      Relevant Medications   lisinopril (ZESTRIL) 30 MG tablet   Hyperlipidemia associated with type 2 diabetes mellitus (HCC)    Controlled cholesterol on lifestyle  Plan: 1. Continue Atorvastatin 10mg  nightly 2. Continue ASA 81mg  for primary ASCVD risk reduction 3. Encourage improved lifestyle - low carb/cholesterol, reduce portion size, continue improving regular exercise      Relevant Medications   lisinopril (ZESTRIL) 30 MG tablet   Malignant neoplasm of prostate (HCC)    Followed by Dr Coralyn Helling Onc S/p rad therapy, no surgical removal Stable monitoring PSA per Oncology PSA has responded to chemo / injection - improved on repeat < 0.04 Next apt 1 week      OSA on CPAP    Well controlled, chronic mild-mod OSA on CPAP - Good adherence to CPAP nightly - Continue current CPAP therapy, patient is benefiting from therapy      Type 2 diabetes mellitus with other specified complication (HCC)    A1c stable at 7.0, No hypoglycemia or hyperglycemia Complications - hyperlipidemia, OSA - increases risk of future  cardiovascular complications   Plan:  - Not on medication for DM, it is diet controlled - Encourage improved lifestyle - low carb, low sugar diet, reduce portion size, continue improving regular exercise - May continue chronic case management for comprehensive nursing education on diabetes among other benefits - Continue ASA, ACEi, Statin DM Foot and Urine microalbumin today  Updated DM Eye Dr Gardiner Fanti      Relevant Medications   lisinopril (ZESTRIL) 30 MG tablet   Other Relevant Orders   Microalbumin / creatinine urine ratio   Other Visit Diagnoses     Annual physical exam    -  Primary   Need for influenza vaccination       Relevant Orders   Flu Vaccine Trivalent High Dose (Fluad) (Completed)   Screening for colon cancer       Relevant Orders   Cologuard       Updated Health Maintenance information Reviewed recent lab results with patient Encouraged improvement to lifestyle with diet and exercise Goal of weight loss    Colon Cancer Screening Last colon screening was in 2020. Patient is due for another screening and has agreed to a Cologuard test. -Order Cologuard test to be sent to patient's home.  General Health Maintenance -Administer influenza vaccine today. -Order urine test to check for proteinuria. -Perform foot exam today, results were normal. -Schedule follow-up appointment in 6 months.        Orders Placed This Encounter  Procedures   Flu Vaccine Trivalent High Dose (Fluad)   Cologuard   Microalbumin / creatinine urine ratio     Meds ordered this encounter  Medications   lisinopril (ZESTRIL) 30 MG tablet    Sig: Take 1 tablet (30 mg total) by mouth daily.    Dispense:  90 tablet    Refill:  3    Dose increase from 20 to 30mg       Follow up plan: Return in about 6 months (around 07/22/2023) for 6 month DM A1c (AM preferred, any day).  Saralyn Pilar, DO [facility] 01/21/2023, 9:26  AM

## 2023-01-22 LAB — MICROALBUMIN / CREATININE URINE RATIO
Creatinine, Urine: 143 mg/dL (ref 20–320)
Microalb Creat Ratio: 4 mg/g{creat} (ref <30)
Microalb, Ur: 0.6 mg/dL

## 2023-01-28 ENCOUNTER — Other Ambulatory Visit: Payer: Self-pay

## 2023-01-28 DIAGNOSIS — C61 Malignant neoplasm of prostate: Secondary | ICD-10-CM

## 2023-01-29 ENCOUNTER — Inpatient Hospital Stay: Payer: PPO | Attending: Oncology

## 2023-01-29 ENCOUNTER — Encounter: Payer: Self-pay | Admitting: Oncology

## 2023-01-29 ENCOUNTER — Inpatient Hospital Stay (HOSPITAL_BASED_OUTPATIENT_CLINIC_OR_DEPARTMENT_OTHER): Payer: PPO | Admitting: Oncology

## 2023-01-29 VITALS — BP 128/69 | HR 64 | Temp 97.2°F | Resp 18 | Ht 70.0 in | Wt 241.1 lb

## 2023-01-29 DIAGNOSIS — Z923 Personal history of irradiation: Secondary | ICD-10-CM | POA: Insufficient documentation

## 2023-01-29 DIAGNOSIS — C61 Malignant neoplasm of prostate: Secondary | ICD-10-CM | POA: Insufficient documentation

## 2023-01-29 DIAGNOSIS — Z87891 Personal history of nicotine dependence: Secondary | ICD-10-CM | POA: Insufficient documentation

## 2023-01-29 DIAGNOSIS — Z79899 Other long term (current) drug therapy: Secondary | ICD-10-CM | POA: Insufficient documentation

## 2023-01-29 LAB — CBC WITH DIFFERENTIAL (CANCER CENTER ONLY)
Abs Immature Granulocytes: 0.02 10*3/uL (ref 0.00–0.07)
Basophils Absolute: 0 10*3/uL (ref 0.0–0.1)
Basophils Relative: 1 %
Eosinophils Absolute: 0.2 10*3/uL (ref 0.0–0.5)
Eosinophils Relative: 3 %
HCT: 38.6 % — ABNORMAL LOW (ref 39.0–52.0)
Hemoglobin: 12.9 g/dL — ABNORMAL LOW (ref 13.0–17.0)
Immature Granulocytes: 0 %
Lymphocytes Relative: 21 %
Lymphs Abs: 1.5 10*3/uL (ref 0.7–4.0)
MCH: 29.1 pg (ref 26.0–34.0)
MCHC: 33.4 g/dL (ref 30.0–36.0)
MCV: 87.1 fL (ref 80.0–100.0)
Monocytes Absolute: 0.5 10*3/uL (ref 0.1–1.0)
Monocytes Relative: 8 %
Neutro Abs: 4.5 10*3/uL (ref 1.7–7.7)
Neutrophils Relative %: 67 %
Platelet Count: 220 10*3/uL (ref 150–400)
RBC: 4.43 MIL/uL (ref 4.22–5.81)
RDW: 12.7 % (ref 11.5–15.5)
WBC Count: 6.8 10*3/uL (ref 4.0–10.5)
nRBC: 0 % (ref 0.0–0.2)

## 2023-01-29 LAB — CMP (CANCER CENTER ONLY)
ALT: 12 U/L (ref 0–44)
AST: 16 U/L (ref 15–41)
Albumin: 4.1 g/dL (ref 3.5–5.0)
Alkaline Phosphatase: 58 U/L (ref 38–126)
Anion gap: 10 (ref 5–15)
BUN: 18 mg/dL (ref 8–23)
CO2: 24 mmol/L (ref 22–32)
Calcium: 9 mg/dL (ref 8.9–10.3)
Chloride: 105 mmol/L (ref 98–111)
Creatinine: 1.09 mg/dL (ref 0.61–1.24)
GFR, Estimated: >60 mL/min (ref >60)
Glucose, Bld: 114 mg/dL — ABNORMAL HIGH (ref 70–99)
Potassium: 4 mmol/L (ref 3.5–5.1)
Sodium: 139 mmol/L (ref 135–145)
Total Bilirubin: 0.3 mg/dL (ref <1.2)
Total Protein: 7 g/dL (ref 6.5–8.1)

## 2023-01-29 LAB — PSA: Prostatic Specific Antigen: <0.01 ng/mL (ref 0.00–4.00)

## 2023-01-29 NOTE — Progress Notes (Signed)
Hematology/Oncology Consult note [facility]  Telephone:([phone] Fax:([phone]  Patient Care Team: Smitty Cords, DO as PCP - General (Family Medicine) Juanetta Gosling Teresita Madura., MD (Inactive) (Unknown Physician Specialty) Delight Ovens, MD (Inactive) as Consulting Physician (Cardiothoracic Surgery) Clance, Maree Krabbe, MD as Consulting Physician (Pulmonary Disease) Delight Ovens, MD (Inactive) as Consulting Physician (Cardiothoracic Surgery) Clance, Maree Krabbe, MD as Consulting Physician (Pulmonary Disease) Minor, Theadora Rama, RN (Inactive) as Case Manager Creig Hines, MD as Consulting Physician (Hematology and Oncology)   Name of the patient: Eric Stone  [id number]  06-24-1945   Date of visit: 01/29/23  Diagnosis- castrate resistant nonmetastatic prostate cancer   Chief complaint/ Reason for visit-routine follow-up of prostate cancer on ADT and apalutamide  Heme/Onc history: patient is a 77 year old male who was diagnosed with Gleason 6 adenocarcinoma of the prostateAbout 5 years ago and received radiation treatment.  He has not undergone the surgery for his prostate but has seen Dr. Lonna Cobb in the past.  He was receiving intermittent ADT and last received 45 mg of Eligard on 09/30/2018.  His PSA following that went down to 0.87 December 2020.  He did not receive any ADT after that and his most recent PSA on 09/24/2019 was elevated at 5.08.  Patient reports having significant hot flashes and fatigue when he gets ADT.    Patient presently on continuous ADT since July 2021 which she gets every 6 months.  Bone scan did not show any evidence of metastatic disease.   Patient noted to have PSA doubling time of about 3 months in August 2022.  CT abdomen and bone scan did not show any evidence of distant metastatic disease.Patient started on apalutamide for castrate resistant nonmetastatic prostate cancer in August 2022      Interval history-he has  chronic fatigue with apalutamide.  Denies any new complaints.  Denies any new aches and pains anywhere  ECOG PS- 1 Pain scale- 0 Opioid associated constipation- no  Review of systems- Review of Systems  Constitutional:  Positive for malaise/fatigue. Negative for chills, fever and weight loss.  HENT:  Negative for congestion, ear discharge and nosebleeds.   Eyes:  Negative for blurred vision.  Respiratory:  Negative for cough, hemoptysis, sputum production, shortness of breath and wheezing.   Cardiovascular:  Negative for chest pain, palpitations, orthopnea and claudication.  Gastrointestinal:  Negative for abdominal pain, blood in stool, constipation, diarrhea, heartburn, melena, nausea and vomiting.  Genitourinary:  Negative for dysuria, flank pain, frequency, hematuria and urgency.  Musculoskeletal:  Negative for back pain, joint pain and myalgias.  Skin:  Negative for rash.  Neurological:  Negative for dizziness, tingling, focal weakness, seizures, weakness and headaches.  Endo/Heme/Allergies:  Does not bruise/bleed easily.  Psychiatric/Behavioral:  Negative for depression and suicidal ideas. The patient does not have insomnia.       Allergies  Allergen Reactions   Penicillins Other (See Comments)    As a child     Past Medical History:  Diagnosis Date   Arthritis    H/O blood clots    H/O blood clots    in left leg in 1988   Hx of dysplastic nevus 06/18/2013   L lat ankle - mild   Melanoma (HCC) 02/21/2016   L prox med plantar great toe - Breslow's 1.47mm, Clark level early 4   Prostate cancer (HCC)    Rash    occasionally and pt does see a dermatologist   Sleep  apnea    uses a CPAP;sleep study >46yrs ago     Past Surgical History:  Procedure Laterality Date   TOE SURGERY     melanoma   TOTAL KNEE ARTHROPLASTY  2009-2010   bilateral   VIDEO ASSISTED THORACOSCOPY (VATS)/DECORTICATION Left 05/06/2012   Procedure: VIDEO ASSISTED THORACOSCOPY (VATS) drainage  loculated pleural effusion;  Surgeon: Delight Ovens, MD;  Location: [facility] OR;  Service: Thoracic;  Laterality: Left;   VIDEO BRONCHOSCOPY N/A 05/06/2012   Procedure: VIDEO BRONCHOSCOPY;  Surgeon: Delight Ovens, MD;  Location: [facility] OR;  Service: Thoracic;  Laterality: N/A;    Social History   Socioeconomic History   Marital status: Married    Spouse name: Not on file   Number of children: Not on file   Years of education: tech school   Highest education level: High school graduate  Occupational History   Occupation: retired  Tobacco Use   Smoking status: Former    Current packs/day: 0.00    Average packs/day: 1 pack/day for 26.0 years (26.0 ttl pk-yrs)    Types: Cigarettes    Start date: 03/27/1963    Quit date: 03/26/1989    Years since quitting: 33.8   Smokeless tobacco: Former   Tobacco comments:    less than 1 PPD  Vaping Use   Vaping status: Never Used  Substance and Sexual Activity   Alcohol use: No    Alcohol/week: 0.0 standard drinks of alcohol   Drug use: No   Sexual activity: Yes  Other Topics Concern   Not on file  Social History Narrative   Not on file   Social Determinants of Health   Financial Resource Strain: Low Risk  (01/07/2018)   Overall Financial Resource Strain (CARDIA)    Difficulty of Paying Living Expenses: Not hard at all  Food Insecurity: No Food Insecurity (01/07/2018)   Hunger Vital Sign    Worried About Running Out of Food in the Last Year: Never true    Ran Out of Food in the Last Year: Never true  Transportation Needs: No Transportation Needs (01/07/2018)   PRAPARE - Administrator, Civil Service (Medical): No    Lack of Transportation (Non-Medical): No  Physical Activity: Inactive (01/07/2018)   Exercise Vital Sign    Days of Exercise per Week: 0 days    Minutes of Exercise per Session: 0 min  Stress: No Stress Concern Present (01/07/2018)   [employer] of Occupational Health - Occupational Stress Questionnaire     Feeling of Stress : Not at all  Social Connections: Moderately Integrated (01/07/2018)   Social Connection and Isolation Panel [NHANES]    Frequency of Communication with Friends and Family: More than three times a week    Frequency of Social Gatherings with Friends and Family: More than three times a week    Attends Religious Services: More than 4 times per year    Active Member of [employer] or Organizations: No    Attends Banker Meetings: Never    Marital Status: Married  Catering manager Violence: Not At Risk (01/07/2018)   Humiliation, Afraid, Rape, and Kick questionnaire    Fear of Current or Ex-Partner: No    Emotionally Abused: No    Physically Abused: No    Sexually Abused: No    Family History  Problem Relation Age of Onset   Hypertension Mother    Stroke Mother        possible   Cancer Brother  Current Outpatient Medications:    azithromycin (ZITHROMAX) 500 MG tablet, SMARTSIG:1 Tablet(s) By Mouth, Disp: , Rfl:    apalutamide (ERLEADA) 60 MG tablet, Take 4 tablets (240 mg total) by mouth daily., Disp: 120 tablet, Rfl: 5   aspirin EC 81 MG tablet, Take 81 mg by mouth., Disp: , Rfl:    atorvastatin (LIPITOR) 10 MG tablet, Take 1 tablet (10 mg total) by mouth at bedtime., Disp: 90 tablet, Rfl: 3   hydrocortisone 2.5 % cream, Apply T, TH, S at bedtime to rash at abdomen., Disp: 30 g, Rfl: 11   ketoconazole (NIZORAL) 2 % cream, Apply M, W, F at bedtime to rash at abdomen., Disp: 30 g, Rfl: 11   lisinopril (ZESTRIL) 30 MG tablet, Take 1 tablet (30 mg total) by mouth daily., Disp: 90 tablet, Rfl: 3   pimecrolimus (ELIDEL) 1 % cream, APPLY   TOPICALLY TO AFFECTED AREA IN GROIN TWICE DAILY, Disp: 60 g, Rfl: 11  Physical exam:  Vitals:   01/29/23 1412  BP: 128/69  Pulse: 64  Resp: 18  Temp: (!) 97.2 F (36.2 C)  TempSrc: Tympanic  SpO2: 98%  Weight: 241 lb 1.6 oz (109.4 kg)  Height: 5\' 10"  (1.778 m)   Physical Exam Cardiovascular:     Rate and Rhythm:  Normal rate and regular rhythm.     Heart sounds: Normal heart sounds.  Pulmonary:     Effort: Pulmonary effort is normal.     Breath sounds: Normal breath sounds.  Musculoskeletal:     Cervical back: Normal range of motion.  Skin:    General: Skin is warm and dry.  Neurological:     Mental Status: He is alert and oriented to person, place, and time.         Latest Ref Rng & Units 01/29/2023    1:50 PM  CMP  Glucose 70 - 99 mg/dL 130   BUN 8 - 23 mg/dL 18   Creatinine 8.65 - 1.24 mg/dL 7.84   Sodium 696 - 295 mmol/L 139   Potassium 3.5 - 5.1 mmol/L 4.0   Chloride 98 - 111 mmol/L 105   CO2 22 - 32 mmol/L 24   Calcium 8.9 - 10.3 mg/dL 9.0   Total Protein 6.5 - 8.1 g/dL 7.0   Total Bilirubin <2.8 mg/dL 0.3   Alkaline Phos 38 - 126 U/L 58   AST 15 - 41 U/L 16   ALT 0 - 44 U/L 12       Latest Ref Rng & Units 01/29/2023    1:50 PM  CBC  WBC 4.0 - 10.5 K/uL 6.8   Hemoglobin 13.0 - 17.0 g/dL 41.3   Hematocrit 24.4 - 52.0 % 38.6   Platelets 150 - 400 K/uL 220     No images are attached to the encounter.  No results found.   Assessment and plan- Patient is a 77 y.o. male with history of castrate resistant nonmetastatic prostate cancer currently on ADT and apalutamide here for routine follow-up  Patient has been on apalutamide since August 2022.  Other than fatigue he is tolerating it well without any significant side effects.  PSA remains undetectable.  He will also remain on ADT and will be due for his next dose in 3 months.  CBC with differential CMP PSA in 3 months and I will see him at that time.  Bone density scan in January 2023 was normal   Visit Diagnosis 1. Malignant neoplasm of prostate (HCC)   2.  High risk medication use      Dr. Owens Shark, MD, MPH [facility] [phone] 01/29/2023 4:27 PM

## 2023-01-31 DIAGNOSIS — Z1211 Encounter for screening for malignant neoplasm of colon: Secondary | ICD-10-CM | POA: Diagnosis not present

## 2023-02-05 DIAGNOSIS — G4733 Obstructive sleep apnea (adult) (pediatric): Secondary | ICD-10-CM | POA: Diagnosis not present

## 2023-02-08 LAB — COLOGUARD: COLOGUARD: NEGATIVE

## 2023-03-13 ENCOUNTER — Other Ambulatory Visit: Payer: Self-pay | Admitting: Oncology

## 2023-03-13 DIAGNOSIS — C61 Malignant neoplasm of prostate: Secondary | ICD-10-CM

## 2023-04-09 ENCOUNTER — Other Ambulatory Visit: Payer: Self-pay

## 2023-04-09 ENCOUNTER — Telehealth: Payer: Self-pay

## 2023-04-09 ENCOUNTER — Encounter: Payer: Self-pay | Admitting: Oncology

## 2023-04-09 ENCOUNTER — Other Ambulatory Visit (HOSPITAL_COMMUNITY): Payer: Self-pay

## 2023-04-09 ENCOUNTER — Other Ambulatory Visit: Payer: Self-pay | Admitting: Pharmacist

## 2023-04-09 DIAGNOSIS — C61 Malignant neoplasm of prostate: Secondary | ICD-10-CM

## 2023-04-09 MED ORDER — APALUTAMIDE 60 MG PO TABS
240.0000 mg | ORAL_TABLET | Freq: Every day | ORAL | 3 refills | Status: DC
  Filled 2023-04-09: qty 120, 30d supply, fill #0
  Filled 2023-05-02: qty 120, 30d supply, fill #1
  Filled 2023-05-24: qty 120, 30d supply, fill #2
  Filled 2023-06-26 (×2): qty 120, 30d supply, fill #3

## 2023-04-09 NOTE — Telephone Encounter (Signed)
 Oral Oncology Patient Advocate Encounter  Re-authorization   Received notification that prior authorization for Erleada  is due for renewal.   PA submitted on 04/09/23  Key [id number]  Status is pending     Morene Potters, CPhT Oncology Pharmacy Patient Advocate  [facility]  [phone] (phone) [phone] (fax) 04/09/2023 8:17 AM

## 2023-04-09 NOTE — Telephone Encounter (Addendum)
 Oral Oncology Patient Advocate Encounter   **PAP to [facility] in Jan 2025**  Was successful in securing patient a $4,250.00 grant from [employer] to provide copayment coverage for Erleada .  This will keep the out of pocket expense at $0.    The billing information is as follows and has been shared with [facility].   Member ID: [id number] Group ID: [id number] RxBin: [id number] PCN: [id number] Dates of Eligibility: 04/02/23 through 04/01/24  Fund name:  Prostate Cancer.   Morene Potters, CPhT Oncology Pharmacy Patient Advocate  [facility]  [phone] (phone) [phone] (fax) 04/09/2023 8:21 AM

## 2023-04-09 NOTE — Telephone Encounter (Signed)
 Oral Oncology Patient Advocate Encounter  Prior Authorization for Erleada  has been approved.    PA# [id number]  Effective dates: 04/09/23 through 04/08/24  Patients co-pay is $2,000.00.   Grant obtained to make co-pay $0.00.   Morene Potters, CPhT Oncology Pharmacy Patient Advocate  [facility]  [phone] (phone) [phone] (fax) 04/09/2023 8:18 AM

## 2023-04-09 NOTE — Progress Notes (Signed)
 Specialty Pharmacy Initiation Note   Eric A Dechert Jr. is a 78 y.o. male who will be followed by the specialty pharmacy service for RxSp Oncology    Review of administration, indication, effectiveness, safety, potential side effects, storage/disposable, and missed dose instructions occurred today for patient's specialty medication(s) Apalutamide  (ERLEADA )     Patient/Caregiver did not have any additional questions or concerns.   Patient's therapy is appropriate to: Continue    Goals Addressed             This Visit's Progress    Slow Disease Progression       Patient is on track. Patient will maintain adherence        Patient switching from PAP to filling at [facility] (Specialty)    Eric Stone Specialty Pharmacist

## 2023-04-09 NOTE — Telephone Encounter (Signed)
 Patient successfully OnBoarded. Medication scheduled to be shipped on Wednesday, 04/10/23, for delivery on Thursday, 04/11/23, from [facility] to patient's address. Patient also knows to call me at [phone] with any questions or concerns regarding receiving medication or if there is any unexpected change in co-pay.    Morene Potters, CPhT Oncology Pharmacy Patient Advocate  [facility]  [phone] (phone) [phone] (fax) 04/09/2023 9:38 AM

## 2023-04-09 NOTE — Progress Notes (Signed)
 Specialty Pharmacy Initial Fill Coordination Note  Eric Stone. is a 78 y.o. male contacted today regarding initial fill of specialty medication(s) Apalutamide  (ERLEADA )  Patient requested Delivery   Delivery date: 04/11/23   Verified address: [address] [place], [place] [address]  Medication will be filled on 04/10/23.   Patient is aware of $0.00 copayment. Bill Cancer Care Secondary.    Morene Potters, CPhT Oncology Pharmacy Patient Advocate  [facility]  [phone] (phone) [phone] (fax) 04/09/2023 9:36 AM

## 2023-04-10 ENCOUNTER — Other Ambulatory Visit: Payer: Self-pay

## 2023-05-01 ENCOUNTER — Inpatient Hospital Stay (HOSPITAL_BASED_OUTPATIENT_CLINIC_OR_DEPARTMENT_OTHER): Payer: PPO | Admitting: Oncology

## 2023-05-01 ENCOUNTER — Inpatient Hospital Stay: Payer: PPO

## 2023-05-01 ENCOUNTER — Inpatient Hospital Stay: Payer: PPO | Attending: Oncology

## 2023-05-01 ENCOUNTER — Other Ambulatory Visit: Payer: Self-pay

## 2023-05-01 ENCOUNTER — Encounter: Payer: Self-pay | Admitting: Oncology

## 2023-05-01 VITALS — BP 145/77 | HR 68 | Temp 97.0°F | Resp 17 | Wt 247.0 lb

## 2023-05-01 DIAGNOSIS — Z79899 Other long term (current) drug therapy: Secondary | ICD-10-CM | POA: Insufficient documentation

## 2023-05-01 DIAGNOSIS — Z79818 Long term (current) use of other agents affecting estrogen receptors and estrogen levels: Secondary | ICD-10-CM | POA: Diagnosis not present

## 2023-05-01 DIAGNOSIS — C61 Malignant neoplasm of prostate: Secondary | ICD-10-CM

## 2023-05-01 DIAGNOSIS — Z5111 Encounter for antineoplastic chemotherapy: Secondary | ICD-10-CM | POA: Insufficient documentation

## 2023-05-01 LAB — COMPREHENSIVE METABOLIC PANEL
ALT: 14 U/L (ref 0–44)
AST: 17 U/L (ref 15–41)
Albumin: 3.9 g/dL (ref 3.5–5.0)
Alkaline Phosphatase: 61 U/L (ref 38–126)
Anion gap: 10 (ref 5–15)
BUN: 18 mg/dL (ref 8–23)
CO2: 25 mmol/L (ref 22–32)
Calcium: 8.7 mg/dL — ABNORMAL LOW (ref 8.9–10.3)
Chloride: 103 mmol/L (ref 98–111)
Creatinine, Ser: 1.1 mg/dL (ref 0.61–1.24)
GFR, Estimated: >60 mL/min (ref >60)
Glucose, Bld: 148 mg/dL — ABNORMAL HIGH (ref 70–99)
Potassium: 3.9 mmol/L (ref 3.5–5.1)
Sodium: 138 mmol/L (ref 135–145)
Total Bilirubin: 0.5 mg/dL (ref 0.0–1.2)
Total Protein: 6.8 g/dL (ref 6.5–8.1)

## 2023-05-01 LAB — CBC WITH DIFFERENTIAL/PLATELET
Abs Immature Granulocytes: 0.02 10*3/uL (ref 0.00–0.07)
Basophils Absolute: 0.1 10*3/uL (ref 0.0–0.1)
Basophils Relative: 1 %
Eosinophils Absolute: 0.2 10*3/uL (ref 0.0–0.5)
Eosinophils Relative: 4 %
HCT: 37.4 % — ABNORMAL LOW (ref 39.0–52.0)
Hemoglobin: 12.6 g/dL — ABNORMAL LOW (ref 13.0–17.0)
Immature Granulocytes: 0 %
Lymphocytes Relative: 18 %
Lymphs Abs: 1.2 10*3/uL (ref 0.7–4.0)
MCH: 29 pg (ref 26.0–34.0)
MCHC: 33.7 g/dL (ref 30.0–36.0)
MCV: 86 fL (ref 80.0–100.0)
Monocytes Absolute: 0.5 10*3/uL (ref 0.1–1.0)
Monocytes Relative: 8 %
Neutro Abs: 4.7 10*3/uL (ref 1.7–7.7)
Neutrophils Relative %: 69 %
Platelets: 218 10*3/uL (ref 150–400)
RBC: 4.35 MIL/uL (ref 4.22–5.81)
RDW: 13.4 % (ref 11.5–15.5)
WBC: 6.8 10*3/uL (ref 4.0–10.5)
nRBC: 0 % (ref 0.0–0.2)

## 2023-05-01 MED ORDER — LEUPROLIDE ACETATE (6 MONTH) 45 MG ~~LOC~~ KIT
45.0000 mg | PACK | Freq: Once | SUBCUTANEOUS | Status: AC
  Administered 2023-05-01: 45 mg via SUBCUTANEOUS
  Filled 2023-05-01: qty 45

## 2023-05-01 NOTE — Progress Notes (Signed)
 Patient here for oncology follow-up appointment, expresses no new concerns at this time.

## 2023-05-01 NOTE — Progress Notes (Signed)
 Hematology/Oncology Consult note [facility]  [phone] Fax:([phone]  Patient Care Team: Edman Marsa PARAS, DO as PCP - General (Family Medicine) Vonzell Lynwood VEAR Raddle., MD (Inactive) (Unknown Physician Specialty) Army Dallas NOVAK, MD (Inactive) as Consulting Physician (Cardiothoracic Surgery) Clance, Francis HERO, MD as Consulting Physician (Pulmonary Disease) Army Dallas NOVAK, MD (Inactive) as Consulting Physician (Cardiothoracic Surgery) Clance, Francis HERO, MD as Consulting Physician (Pulmonary Disease) Minor, Glynn RAMAN, RN (Inactive) as Case Manager Melanee Annah BROCKS, MD as Consulting Physician (Hematology and Oncology)   Name of the patient: Eric Stone  [id number]  [birth year]   Date of visit: 05/01/23  Diagnosis- castrate resistant nonmetastatic prostate cancer   Chief complaint/ Reason for visit- routine f/u of prostate cancer on eligard  and apalutamide   Heme/Onc history:  patient is a 78 year old male who was diagnosed with Gleason 6 adenocarcinoma of the prostateAbout 5 years ago and received radiation treatment.  He has not undergone the surgery for his prostate but has seen Dr. Twylla in the past.  He was receiving intermittent ADT and last received 45 mg of Eligard  on 09/30/2018.  His PSA following that went down to 0.87 December 2020.  He did not receive any ADT after that and his most recent PSA on 09/24/2019 was elevated at 5.08.  Patient reports having significant hot flashes and fatigue when he gets ADT.    Patient presently on continuous ADT since July 2021 which she gets every 6 months.  Bone scan did not show any evidence of metastatic disease.   Patient noted to have PSA doubling time of about 3 months in August 2022.  CT abdomen and bone scan did not show any evidence of distant metastatic disease.Patient started on apalutamide  for castrate resistant nonmetastatic prostate cancer in August 2022    Interval history-fatigue  has remained unchanged on apalutamide  and Eligard .  Denies any hot flashes.  He has chronic bilateral leg swelling which gets worse as the day progresses but improves when he elevates his legs.  ECOG PS- 1 Pain scale- 0   Review of systems- Review of Systems  Constitutional:  Positive for malaise/fatigue. Negative for chills, fever and weight loss.  HENT:  Negative for congestion, ear discharge and nosebleeds.   Eyes:  Negative for blurred vision.  Respiratory:  Negative for cough, hemoptysis, sputum production, shortness of breath and wheezing.   Cardiovascular:  Negative for chest pain, palpitations, orthopnea and claudication.  Gastrointestinal:  Negative for abdominal pain, blood in stool, constipation, diarrhea, heartburn, melena, nausea and vomiting.  Genitourinary:  Negative for dysuria, flank pain, frequency, hematuria and urgency.  Musculoskeletal:  Negative for back pain, joint pain and myalgias.  Skin:  Negative for rash.  Neurological:  Negative for dizziness, tingling, focal weakness, seizures, weakness and headaches.  Endo/Heme/Allergies:  Does not bruise/bleed easily.  Psychiatric/Behavioral:  Negative for depression and suicidal ideas. The patient does not have insomnia.       Allergies  Allergen Reactions   Penicillins Other (See Comments)    As a child     Past Medical History:  Diagnosis Date   Arthritis    H/O blood clots    H/O blood clots    in left leg in 1988   Hx of dysplastic nevus 06/18/2013   L lat ankle - mild   Melanoma (HCC) 02/21/2016   L prox med plantar great toe - Breslow's 1.67mm, Clark level early 4   Prostate cancer (HCC)    Rash  occasionally and pt does see a dermatologist   Sleep apnea    uses a CPAP;sleep study >21yrs ago     Past Surgical History:  Procedure Laterality Date   TOE SURGERY     melanoma   TOTAL KNEE ARTHROPLASTY  2009-2010   bilateral   VIDEO ASSISTED THORACOSCOPY (VATS)/DECORTICATION Left 05/06/2012    Procedure: VIDEO ASSISTED THORACOSCOPY (VATS) drainage loculated pleural effusion;  Surgeon: Dallas KATHEE Jude, MD;  Location: [facility] OR;  Service: Thoracic;  Laterality: Left;   VIDEO BRONCHOSCOPY N/A 05/06/2012   Procedure: VIDEO BRONCHOSCOPY;  Surgeon: Dallas KATHEE Jude, MD;  Location: [facility] OR;  Service: Thoracic;  Laterality: N/A;    Social History   Socioeconomic History   Marital status: Married    Spouse name: Not on file   Number of children: Not on file   Years of education: tech school   Highest education level: High school graduate  Occupational History   Occupation: retired  Tobacco Use   Smoking status: Former    Current packs/day: 0.00    Average packs/day: 1 pack/day for 26.0 years (26.0 ttl pk-yrs)    Types: Cigarettes    Start date: 03/27/1963    Quit date: 03/26/1989    Years since quitting: 34.1   Smokeless tobacco: Former   Tobacco comments:    less than 1 PPD  Vaping Use   Vaping status: Never Used  Substance and Sexual Activity   Alcohol use: No    Alcohol/week: 0.0 standard drinks of alcohol   Drug use: No   Sexual activity: Yes  Other Topics Concern   Not on file  Social History Narrative   Not on file   Social Drivers of Health   Financial Resource Strain: Low Risk  (01/07/2018)   Overall Financial Resource Strain (CARDIA)    Difficulty of Paying Living Expenses: Not hard at all  Food Insecurity: No Food Insecurity (01/07/2018)   Hunger Vital Sign    Worried About Running Out of Food in the Last Year: Never true    Ran Out of Food in the Last Year: Never true  Transportation Needs: No Transportation Needs (01/07/2018)   PRAPARE - Administrator, Civil Service (Medical): No    Lack of Transportation (Non-Medical): No  Physical Activity: Inactive (01/07/2018)   Exercise Vital Sign    Days of Exercise per Week: 0 days    Minutes of Exercise per Session: 0 min  Stress: No Stress Concern Present (01/07/2018)   [employer] of  Occupational Health - Occupational Stress Questionnaire    Feeling of Stress : Not at all  Social Connections: Moderately Integrated (01/07/2018)   Social Connection and Isolation Panel [NHANES]    Frequency of Communication with Friends and Family: More than three times a week    Frequency of Social Gatherings with Friends and Family: More than three times a week    Attends Religious Services: More than 4 times per year    Active Member of [employer] or Organizations: No    Attends Banker Meetings: Never    Marital Status: Married  Catering Manager Violence: Not At Risk (01/07/2018)   Humiliation, Afraid, Rape, and Kick questionnaire    Fear of Current or Ex-Partner: No    Emotionally Abused: No    Physically Abused: No    Sexually Abused: No    Family History  Problem Relation Age of Onset   Hypertension Mother    Stroke Mother  possible   Cancer Brother      Current Outpatient Medications:    apalutamide  (ERLEADA ) 60 MG tablet, Take 4 tablets (240 mg total) by mouth daily., Disp: 120 tablet, Rfl: 3   aspirin  EC 81 MG tablet, Take 81 mg by mouth., Disp: , Rfl:    atorvastatin  (LIPITOR) 10 MG tablet, Take 1 tablet (10 mg total) by mouth at bedtime., Disp: 90 tablet, Rfl: 3   hydrocortisone  2.5 % cream, Apply T, TH, S at bedtime to rash at abdomen., Disp: 30 g, Rfl: 11   ketoconazole  (NIZORAL ) 2 % cream, Apply M, W, F at bedtime to rash at abdomen., Disp: 30 g, Rfl: 11   lisinopril  (ZESTRIL ) 30 MG tablet, Take 1 tablet (30 mg total) by mouth daily., Disp: 90 tablet, Rfl: 3   pimecrolimus  (ELIDEL ) 1 % cream, APPLY   TOPICALLY TO AFFECTED AREA IN GROIN TWICE DAILY, Disp: 60 g, Rfl: 11   azithromycin  (ZITHROMAX ) 500 MG tablet, SMARTSIG:1 Tablet(s) By Mouth (Patient not taking: Reported on 05/01/2023), Disp: , Rfl:  No current facility-administered medications for this visit.  Facility-Administered Medications Ordered in Other Visits:    leuprolide  (6 Month) (ELIGARD )  injection 45 mg, 45 mg, Subcutaneous, Once, Melanee Annah BROCKS, MD  Physical exam:  Vitals:   05/01/23 1433 05/01/23 1437  BP: (!) 162/82 (!) 145/77  Pulse: 68   Resp: 17   Temp: (!) 97 F (36.1 C)   SpO2: 96%   Weight: 247 lb (112 kg)    Physical Exam HENT:     Head: Atraumatic.  Cardiovascular:     Rate and Rhythm: Normal rate and regular rhythm.     Heart sounds: Normal heart sounds.  Pulmonary:     Effort: Pulmonary effort is normal.     Breath sounds: Normal breath sounds.  Abdominal:     General: Bowel sounds are normal.     Palpations: Abdomen is soft.  Musculoskeletal:     Right lower leg: Edema present.     Left lower leg: Edema present.  Skin:    General: Skin is warm and dry.  Neurological:     Mental Status: He is alert and oriented to person, place, and time.         Latest Ref Rng & Units 05/01/2023    2:02 PM  CMP  Glucose 70 - 99 mg/dL 851   BUN 8 - 23 mg/dL 18   Creatinine 9.38 - 1.24 mg/dL 8.89   Sodium 864 - 854 mmol/L 138   Potassium 3.5 - 5.1 mmol/L 3.9   Chloride 98 - 111 mmol/L 103   CO2 22 - 32 mmol/L 25   Calcium  8.9 - 10.3 mg/dL 8.7   Total Protein 6.5 - 8.1 g/dL 6.8   Total Bilirubin 0.0 - 1.2 mg/dL 0.5   Alkaline Phos 38 - 126 U/L 61   AST 15 - 41 U/L 17   ALT 0 - 44 U/L 14       Latest Ref Rng & Units 05/01/2023    2:02 PM  CBC  WBC 4.0 - 10.5 K/uL 6.8   Hemoglobin 13.0 - 17.0 g/dL 87.3   Hematocrit 60.9 - 52.0 % 37.4   Platelets 150 - 400 K/uL 218     No images are attached to the encounter.  No results found.   Assessment and plan- Patient is a 78 y.o. male with history of castrate resistant nonmetastatic prostate cancer currently on apalutamide  and ADT here for routine follow-up  Patient will receive his next dose of Eligard  today.  Tolerating apalutamide  otherwise well without any significant side effects.  PSA remains undetectable at this time plan is to continue with apalutamide  plus ADT until progression or toxicity.  I  will see him back in 3 months with CBC with differential CMP and PSA.  His last bone density scan was 2 years ago and was normal.  I will get a repeat bone density scan before my next visit   Visit Diagnosis 1. High risk medication use   2. Encounter for monitoring androgen deprivation therapy   3. Malignant neoplasm of prostate [facility])      Dr. Annah Skene, MD, MPH [facility] [phone] 05/01/2023 2:45 PM

## 2023-05-02 ENCOUNTER — Other Ambulatory Visit: Payer: Self-pay

## 2023-05-02 LAB — PSA: Prostatic Specific Antigen: <0.01 ng/mL (ref 0.00–4.00)

## 2023-05-02 NOTE — Progress Notes (Signed)
 Specialty Pharmacy Refill Coordination Note  Eric Stone. is a 77 y.o. male contacted today regarding refills of specialty medication(s) Apalutamide  (ERLEADA )   Patient requested Delivery   Delivery date: 05/07/23   Verified address: [address] [place], [place] [address]   Medication will be filled on 05/06/23.

## 2023-05-02 NOTE — Progress Notes (Signed)
 Specialty Pharmacy Ongoing Clinical Assessment Note  Eric Stone. is a 78 y.o. male who is being followed by the specialty pharmacy service for RxSp Oncology   Patient's specialty medication(s) reviewed today: Apalutamide  (ERLEADA )   Missed doses in the last 4 weeks: 0   Patient/Caregiver did not have any additional questions or concerns.   Therapeutic benefit summary: Patient is achieving benefit   Adverse events/side effects summary: No adverse events/side effects   Patient's therapy is appropriate to: Continue    Goals Addressed             This Visit's Progress    Slow Disease Progression   On track    Patient is on track. Patient will maintain adherence         Follow up:  6 months  Ladonna Vanorder M Fotini Lemus Specialty Pharmacist

## 2023-05-06 ENCOUNTER — Other Ambulatory Visit: Payer: Self-pay

## 2023-05-24 ENCOUNTER — Other Ambulatory Visit: Payer: Self-pay | Admitting: Pharmacy Technician

## 2023-05-24 ENCOUNTER — Other Ambulatory Visit: Payer: Self-pay

## 2023-05-24 NOTE — Progress Notes (Signed)
 Specialty Pharmacy Refill Coordination Note  Eric Stone. is a 78 y.o. male contacted today regarding refills of specialty medication(s) Apalutamide [name])  Spoke with Wife  Patient requested Delivery   Delivery date: 05/29/23   Verified address: [address] [facility] [address]   Medication will be filled on 05/28/23.

## 2023-05-28 ENCOUNTER — Other Ambulatory Visit: Payer: Self-pay

## 2023-06-19 ENCOUNTER — Other Ambulatory Visit (HOSPITAL_COMMUNITY): Payer: Self-pay

## 2023-06-21 ENCOUNTER — Other Ambulatory Visit (HOSPITAL_COMMUNITY): Payer: Self-pay

## 2023-06-26 ENCOUNTER — Other Ambulatory Visit (HOSPITAL_COMMUNITY): Payer: Self-pay

## 2023-06-26 ENCOUNTER — Other Ambulatory Visit: Payer: Self-pay

## 2023-06-26 NOTE — Progress Notes (Signed)
 Specialty Pharmacy Refill Coordination Note  Eric Stone. is a 78 y.o. male contacted today regarding refills of specialty medication(s) Apalutamide [name])   Patient requested Delivery   Delivery date: 06/28/23   Verified address: [address]   [place] [address]   Medication will be filled on 06/27/23.

## 2023-06-27 ENCOUNTER — Ambulatory Visit (INDEPENDENT_AMBULATORY_CARE_PROVIDER_SITE_OTHER): Payer: Self-pay | Admitting: Family Medicine

## 2023-06-27 ENCOUNTER — Encounter: Payer: Self-pay | Admitting: Family Medicine

## 2023-06-27 ENCOUNTER — Other Ambulatory Visit: Payer: Self-pay | Admitting: Family Medicine

## 2023-06-27 VITALS — BP 124/70 | HR 64 | Ht 70.0 in | Wt 241.0 lb

## 2023-06-27 DIAGNOSIS — I10 Essential (primary) hypertension: Secondary | ICD-10-CM

## 2023-06-27 DIAGNOSIS — Z0000 Encounter for general adult medical examination without abnormal findings: Secondary | ICD-10-CM

## 2023-06-27 DIAGNOSIS — C61 Malignant neoplasm of prostate: Secondary | ICD-10-CM

## 2023-06-27 DIAGNOSIS — E785 Hyperlipidemia, unspecified: Secondary | ICD-10-CM

## 2023-06-27 DIAGNOSIS — E1169 Type 2 diabetes mellitus with other specified complication: Secondary | ICD-10-CM

## 2023-06-27 DIAGNOSIS — Z23 Encounter for immunization: Secondary | ICD-10-CM | POA: Diagnosis not present

## 2023-06-27 DIAGNOSIS — G4733 Obstructive sleep apnea (adult) (pediatric): Secondary | ICD-10-CM

## 2023-06-27 DIAGNOSIS — Z79899 Other long term (current) drug therapy: Secondary | ICD-10-CM

## 2023-06-27 LAB — POCT GLYCOSYLATED HEMOGLOBIN (HGB A1C): Hemoglobin A1C: 6.6 % — AB (ref 4.0–5.6)

## 2023-06-27 MED ORDER — ATORVASTATIN CALCIUM 10 MG PO TABS: 10.0000 mg | ORAL_TABLET | Freq: Every day | ORAL | 3 refills | Status: DC

## 2023-06-27 NOTE — Patient Instructions (Addendum)
 Thank you for coming to the office today.  Recent Labs    01/16/23 0900 06/27/23 0846  HGBA1C 7.0* 6.6*   Refilled cholesterol medication  BP is well controlled  Prevnar-20 pneumonia vaccine today  Future consideration Coronary Calcium Score Cardiac CT Scan. This is a screening test for patients aged 76-50+ with cardiovascular risk factors or who are healthy but would be interested in Cardiovascular Screening for heart disease. Even if there is a family history of heart disease, this imaging can be useful. Typically it can be done every 5+ years or at a different timeline we agree on  The scan will look at the chest and mainly focus on the heart and identify early signs of calcium build up or blockages within the heart arteries. It is not 100% accurate for identifying blockages or heart disease, but it is useful to help [place] predict who may have some early changes or be at risk in the future for a heart attack or cardiovascular problem.  The results are reviewed by a Cardiologist and they will document the results. It should become available on MyChart. Typically the results are divided into percentiles based on other patients of the same demographic and age. So it will compare your risk to others similar to you. If you have a higher score >99 or higher percentile >75%tile, it is recommended to consider Statin cholesterol therapy and or referral to Cardiologist. I will try to help explain your results and if we have questions we can contact the Cardiologist.  You will be contacted for scheduling. Usually it is done at any imaging facility through [facility], [facility] or [facility].  The cost is $99 flat fee total and it does not go through insurance, so no authorization is required.  DUE for FASTING BLOOD WORK (no food or drink after midnight before the lab appointment, only water or coffee without cream/sugar on the morning of)  SCHEDULE "Lab Only" visit  in the morning at the clinic for lab draw in 6 MONTHS   - Make sure Lab Only appointment is at about 1 week before your next appointment, so that results will be available  For Lab Results, once available within 2-3 days of blood draw, you can can log in to MyChart online to view your results and a brief explanation. Also, we can discuss results at next follow-up visit.   Please schedule a Follow-up Appointment to: Return for 6 month fasting lab > 1 week later Annual Physical.  If you have any other questions or concerns, please feel free to call the office or send a message through MyChart. You may also schedule an earlier appointment if necessary.  Additionally, you may be receiving a survey about your experience at our office within a few days to 1 week by e-mail or mail. We value your feedback.  Eric Pilar, DO [facility], [place]

## 2023-06-27 NOTE — Progress Notes (Signed)
 Subjective:    Patient ID: Eric Bury., male    DOB: 1946-03-22, 78 y.o.   MRN: [id number]  Eric Gappa. is a 78 y.o. male presenting on 06/27/2023 for Diabetes   HPI  Discussed the use of AI scribe software for clinical note transcription with the patient, who gave verbal consent to proceed.  History of Present Illness   Eric Rothbauer. is a 78 year old male with type 2 diabetes who presents for a six-month follow-up visit.    CHRONIC HTN: CKD II BP well controlled now on monotherapy. No longer on hydrochlorothiazide Controlled. Not checking regularly now. Current Meds - Lisinopril 30mg  daily Reports good compliance, took meds today. Tolerating well, w/o complaints. - Admits mild varicose veins edema, but improved on compression stocking - stable after off thiazide Denies CP, dyspnea, HA, dizziness / lightheadedness   Type 2 Diabetes A1c with improvement from 7 down to 6.6 over the past six months. He attributes this improvement to lifestyle modifications and is not currently on any diabetes medication. Meds: never on meds Currently on ACEi Lifestyle: Weight down 6-7 lbs in past 6 months - Diet (Improved diet, reduce portions) - Exercise (Improved activity and exercise) - S/p L Great Toe Amputation Completed Diabetic Eye Exam 01/14/23 Dr Beverely Pace in [place] Denies hypoglycemia, polyuria, visual changes, numbness or tingling.   HYPERLIPIDEMIA: Last lab LDL 89, controlled - Currently taking Atorvastatin 10mg , tolerating well without side effects or myalgias   OSA, on CPAP - Patient reports prior history of dx OSA and on CPAP for >20 years, prior to treatment his initial symptoms were snoring, daytime sleepiness and fatigue, he has had several sleep studies in the past. Previously dx mild to moderate OSA. - He reports that his sleep apnea is well controlled. He uses the CPAP machine every night. He tolerates the machine well, and thinks that he sleeps better with it  and feels good. No new concerns or symptoms.    PMH - Melanoma, Left big toe / History of Skin Cancer S/p amputation - followed by [facility]   PMH Chronic Low Back Pain, with osteoarthritis Has seen Van Clines PA [facility], had x-rays, copied below of lumbar spine, has gradually improved, advanced OA/DJD, no other intervention. He can return if worsening.   S/p prostate cancer Followed by Dr Smith Robert and Dr Rushie Chestnut for Prostate Cancer Last report updated 04/2023 with PSA still undetectable < 0.01 on therapy. He continues on Lupron injection plus apalutamide PSA trend every 3 months   Health Maintenance: Prevnar-20 vaccine due today    Colon CA screening - Last cologuard done 01/31/23 negative. Consider repeat 3 years 2027      06/27/2023    8:40 AM 01/21/2023    9:10 AM 10/24/2022   11:55 AM  Depression screen PHQ 2/9  Decreased Interest 0 0 0  Down, Depressed, Hopeless 0 0 0  PHQ - 2 Score 0 0 0  Altered sleeping 0 0 0  Tired, decreased energy 2 0 2  Change in appetite 0 0 0  Feeling bad or failure about yourself  0 0 0  Trouble concentrating 0 0 0  Moving slowly or fidgety/restless 0 0 0  Suicidal thoughts 0 0 0  PHQ-9 Score 2 0 2  Difficult doing work/chores Somewhat difficult Not difficult at all Not difficult at all       06/27/2023    8:40 AM 01/21/2023    9:10 AM 10/24/2022  11:56 AM 05/21/2022    9:47 AM  GAD 7 : Generalized Anxiety Score  Nervous, Anxious, on Edge 0 0 0 0  Control/stop worrying 0 0 0 0  Worry too much - different things 0 0 0 0  Trouble relaxing 0 0 0 0  Restless 0 0 0 0  Easily annoyed or irritable 0 0 0 0  Afraid - awful might happen 0 0 0 0  Total GAD 7 Score 0 0 0 0  Anxiety Difficulty Not difficult at all  Not difficult at all Not difficult at all    Social History   Tobacco Use   Smoking status: Former    Current packs/day: 0.00    Average packs/day: 1 pack/day for 26.0 years (26.0 ttl pk-yrs)    Types: Cigarettes    Start  date: 03/27/1963    Quit date: 03/26/1989    Years since quitting: 34.2   Smokeless tobacco: Former   Tobacco comments:    less than 1 PPD  Vaping Use   Vaping status: Never Used  Substance Use Topics   Alcohol use: No    Alcohol/week: 0.0 standard drinks of alcohol   Drug use: No    Review of Systems Per HPI unless specifically indicated above     Objective:    BP 124/70 (BP Location: Right Arm, Patient Position: Sitting, Cuff Size: Normal)   Pulse 64   Ht 5\' 10"  (1.778 m)   Wt 241 lb (109.3 kg)   SpO2 97%   BMI 34.58 kg/m   Wt Readings from Last 3 Encounters:  06/27/23 241 lb (109.3 kg)  05/01/23 247 lb (112 kg)  01/29/23 241 lb 1.6 oz (109.4 kg)    Physical Exam Vitals and nursing note reviewed.  Constitutional:      General: He is not in acute distress.    Appearance: He is well-developed. He is not diaphoretic.     Comments: Well-appearing, comfortable, cooperative  HENT:     Head: Normocephalic and atraumatic.  Eyes:     General:        Right eye: No discharge.        Left eye: No discharge.     Conjunctiva/sclera: Conjunctivae normal.  Neck:     Thyroid: No thyromegaly.  Cardiovascular:     Rate and Rhythm: Normal rate and regular rhythm.     Pulses: Normal pulses.     Heart sounds: Normal heart sounds. No murmur heard. Pulmonary:     Effort: Pulmonary effort is normal. No respiratory distress.     Breath sounds: Normal breath sounds. No wheezing or rales.  Musculoskeletal:        General: Normal range of motion.     Cervical back: Normal range of motion and neck supple.  Lymphadenopathy:     Cervical: No cervical adenopathy.  Skin:    General: Skin is warm and dry.     Findings: No erythema or rash.  Neurological:     Mental Status: He is alert and oriented to person, place, and time. Mental status is at baseline.  Psychiatric:        Behavior: Behavior normal.     Comments: Well groomed, good eye contact, normal speech and thoughts      Results for orders placed or performed in visit on 06/27/23  POCT HgB A1C   Collection Time: 06/27/23  8:46 AM  Result Value Ref Range   Hemoglobin A1C 6.6 (A) 4.0 - 5.6 %   HbA1c  POC (<> result, manual entry)     HbA1c, POC (prediabetic range)     HbA1c, POC (controlled diabetic range)        Assessment & Plan:   Problem List Items Addressed This Visit     Essential hypertension   Relevant Medications   atorvastatin (LIPITOR) 10 MG tablet   Hyperlipidemia associated with type 2 diabetes mellitus (HCC)   Relevant Medications   atorvastatin (LIPITOR) 10 MG tablet   Malignant neoplasm of prostate (HCC)   OSA on CPAP   Type 2 diabetes mellitus with other specified complication (HCC) - Primary   Relevant Medications   atorvastatin (LIPITOR) 10 MG tablet   Other Relevant Orders   POCT HgB A1C (Completed)   Other Visit Diagnoses       Need for Streptococcus pneumoniae vaccination       Relevant Orders   Pneumococcal conjugate vaccine 20-valent (Completed)        Type 2 Diabetes Mellitus Well-controlled with lifestyle modifications. Hemoglobin A1c improved to 6.6%. Not on medication - Continue lifestyle modifications. - Follow up with Dr. Smith Robert for blood panel every three months.  Hypertension Well-controlled with monotherapy. Blood pressure 124/70 mmHg. - Continue current lisinopril 30mg  daily - Monitor blood pressure regularly.  Hyperlipidemia Managed with atorvastatin 10mg  - Send prescription for atorvastatin for future refills.  Prostate Cancer Followed by Oncology In remission with undetectable PSA levels. Next PSA test in May. - Continue regular PSA monitoring every three months.  OSA on CPAP Continues to use nightly and benefit from therapy  General Health Maintenance Up to date with screenings and vaccinations. Negative Cologuard test in November. Due for pneumonia vaccination. - Administer Prevnar 20 pneumonia vaccine today. - Consider future  Cologuard testing as needed. - Discuss potential heart CT scan in October.  Follow-up Routine follow-up planned for chronic condition management. - Schedule next follow-up appointment in October. - Perform blood work prior to the October appointment.        Orders Placed This Encounter  Procedures   Pneumococcal conjugate vaccine 20-valent   POCT HgB A1C    Meds ordered this encounter  Medications   atorvastatin (LIPITOR) 10 MG tablet    Sig: Take 1 tablet (10 mg total) by mouth at bedtime.    Dispense:  90 tablet    Refill:  3    Add future refills, patient is not ready for medicine today    Follow up plan: Return for 6 month fasting lab > 1 week later Annual Physical.  Future labs ordered for 6 months   Saralyn Pilar, DO [facility] 06/27/2023, 8:47 AM

## 2023-07-01 ENCOUNTER — Telehealth: Payer: Self-pay

## 2023-07-01 DIAGNOSIS — E1169 Type 2 diabetes mellitus with other specified complication: Secondary | ICD-10-CM

## 2023-07-01 DIAGNOSIS — I10 Essential (primary) hypertension: Secondary | ICD-10-CM

## 2023-07-01 NOTE — Telephone Encounter (Signed)
 I asked him to contact us back if/when he was ready to pursue this imaging test. I have placed the order just now for Coronary Calcium Score CT. Someone from CT imaging will call him to schedule.  Thanks!  Saralyn Pilar, DO [facility] 07/01/2023, 4:09 PM

## 2023-07-01 NOTE — Telephone Encounter (Signed)
 Copied from CRM [id number]. Topic: General - Other >> Jul 01, 2023 11:45 AM Eunice Blase wrote: Reason for CRM: Pt called stated he would like to schedule for Coronary Calcium Score Cardiac CT Scan.Please call pt at [phone] or [phone].

## 2023-07-01 NOTE — Addendum Note (Signed)
 Addended by: Smitty Cords on: 07/01/2023 04:10 PM   Modules accepted: Orders

## 2023-07-03 ENCOUNTER — Ambulatory Visit
Admission: RE | Admit: 2023-07-03 | Discharge: 2023-07-03 | Disposition: A | Discharge status: Home / Self Care | Payer: Self-pay | Source: Ambulatory Visit | Attending: Family Medicine | Admitting: Family Medicine

## 2023-07-03 ENCOUNTER — Encounter: Payer: Self-pay | Admitting: Family Medicine

## 2023-07-03 ENCOUNTER — Ambulatory Visit: Payer: PPO | Admitting: Dermatology

## 2023-07-03 DIAGNOSIS — I2510 Atherosclerotic heart disease of native coronary artery without angina pectoris: Secondary | ICD-10-CM | POA: Diagnosis not present

## 2023-07-03 DIAGNOSIS — E1169 Type 2 diabetes mellitus with other specified complication: Secondary | ICD-10-CM | POA: Insufficient documentation

## 2023-07-03 DIAGNOSIS — I10 Essential (primary) hypertension: Secondary | ICD-10-CM | POA: Insufficient documentation

## 2023-07-03 DIAGNOSIS — E785 Hyperlipidemia, unspecified: Secondary | ICD-10-CM | POA: Insufficient documentation

## 2023-07-04 ENCOUNTER — Other Ambulatory Visit: Payer: Self-pay | Admitting: Family Medicine

## 2023-07-04 DIAGNOSIS — R931 Abnormal findings on diagnostic imaging of heart and coronary circulation: Secondary | ICD-10-CM

## 2023-07-04 DIAGNOSIS — E785 Hyperlipidemia, unspecified: Secondary | ICD-10-CM

## 2023-07-12 ENCOUNTER — Encounter: Payer: Self-pay | Admitting: Oncology

## 2023-07-15 NOTE — Progress Notes (Signed)
 Cardiology Office Note  Date:  07/16/2023   ID:  Eric Stone., DOB [birth year], MRN [id number]  PCP:  Raina Bunting, DO   Chief Complaint  Patient presents with   New Patient (Initial Visit)    Ref by Dr. Romeo Co for elevated coronary artery calcium  score, hyperlipidemia & diabetes Type II. Patient c/o shortness of breath with over exertion & bilateral LE edema with more on the left.    HPI:  [name] is a 78 year old gentleman with past medical history of Diabetes type 2 Hyperlipidemia Hypertension Sleep apnea, on CPAP Prostate cancer Who presents by referral from Dr. 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, active in general, does not sit around much Leases pastures, owns several cows, Grows hay, horses on the property  Chronic lower extremity swelling for many years  Calcium  score July 03, 2023 Score 469  Lab work reviewed A1c 6.6 Total cholesterol 164 LDL 89  EKG personally reviewed by myself on todays visit EKG Interpretation Date/Time:  Tuesday July 16 2023 08:43:58 EDT Ventricular Rate:  64 PR Interval:  190 QRS Duration:  84 QT Interval:  432 QTC Calculation: 445 R Axis:   -26  Text Interpretation: Sinus rhythm with Premature atrial complexes When compared with ECG of 05-May-2012 14:06, Premature atrial complexes are now Present Confirmed by Belva Boyden [phone]) on 07/16/2023 8:55:35 AM    PMH:   has a past medical history of Arthritis, H/O blood clots, H/O blood clots, dysplastic nevus (06/18/2013), Melanoma (HCC) (02/21/2016), Prostate cancer (HCC), Rash, and Sleep apnea.  PSH:    Past Surgical History:  Procedure Laterality Date   TOE SURGERY     melanoma   TOTAL KNEE ARTHROPLASTY  2009-2010    bilateral   VIDEO ASSISTED THORACOSCOPY (VATS)/DECORTICATION Left 05/06/2012   Procedure: VIDEO ASSISTED THORACOSCOPY (VATS) drainage loculated pleural effusion;  Surgeon: Norita Beauvais, MD;  Location: [facility] OR;  Service: Thoracic;  Laterality: Left;   VIDEO BRONCHOSCOPY N/A 05/06/2012   Procedure: VIDEO BRONCHOSCOPY;  Surgeon: Norita Beauvais, MD;  Location: [facility] OR;  Service: Thoracic;  Laterality: N/A;    Current Outpatient Medications  Medication Sig Dispense Refill   apalutamide  (ERLEADA ) 60 MG tablet Take 4 tablets (240 mg total) by mouth daily. 120 tablet 3   aspirin  EC 81 MG tablet Take 81 mg by mouth.     atorvastatin  (LIPITOR) 10 MG tablet Take 1 tablet (10 mg total) by mouth at bedtime. 90 tablet 3   hydrocortisone  2.5 % cream Apply T, TH, S at bedtime to rash at abdomen. 30 g 11   ketoconazole  (NIZORAL ) 2 % cream Apply M, W, F at bedtime to rash at abdomen. 30 g 11   lisinopril  (ZESTRIL ) 30 MG tablet Take 1 tablet (30 mg total) by mouth daily. 90 tablet 3   pimecrolimus  (ELIDEL ) 1 % cream APPLY   TOPICALLY TO AFFECTED AREA IN GROIN TWICE DAILY 60 g 11   No current facility-administered medications for this visit.     Allergies:   Penicillins   Social History:  The patient  reports that he quit smoking about 34 years ago. His smoking use included cigarettes. He started smoking about 60 years ago. He has a 26 pack-year smoking history. He has quit using  smokeless tobacco. He reports that he does not drink alcohol and does not use drugs.   Family History:   family history includes Cancer in his brother; Heart attack in his brother; Heart disease in his brother; Hypertension in his mother; Stroke in his mother.    Review of Systems: Review of Systems  Constitutional: Negative.   HENT: Negative.    Respiratory: Negative.    Cardiovascular: Negative.   Gastrointestinal: Negative.   Musculoskeletal: Negative.   Neurological: Negative.   Psychiatric/Behavioral: Negative.     All other systems reviewed and are negative.   PHYSICAL EXAM: VS:  BP (!) 140/70 (BP Location: Right Arm, Patient Position: Sitting, Cuff Size: Normal)   Pulse 64   Ht 5\' 10"  (1.778 m)   Wt 242 lb (109.8 kg)   SpO2 98%   BMI 34.72 kg/m  , BMI Body mass index is 34.72 kg/m. GEN: Well nourished, well developed, in no acute distress HEENT: normal Neck: no JVD, carotid bruits, or masses Cardiac: RRR; no murmurs, rubs, or gallops,no edema  Respiratory:  clear to auscultation bilaterally, normal work of breathing GI: soft, nontender, nondistended, + BS MS: no deformity or atrophy Skin: warm and dry, no rash Neuro:  Strength and sensation are intact Psych: euthymic mood, full affect    Recent Labs: 01/16/2023: TSH 1.34 05/01/2023: ALT 14; BUN 18; Creatinine, Ser 1.10; Hemoglobin 12.6; Platelets 218; Potassium 3.9; Sodium 138    Lipid Panel Lab Results  Component Value Date   CHOL 164 01/16/2023   HDL 55 01/16/2023   LDLCALC 89 01/16/2023   TRIG 106 01/16/2023      Wt Readings from Last 3 Encounters:  07/16/23 242 lb (109.8 kg)  06/27/23 241 lb (109.3 kg)  05/01/23 247 lb (112 kg)       ASSESSMENT AND PLAN:  Problem List Items Addressed This Visit       Cardiology Problems   Essential hypertension - Primary   Relevant Orders   EKG 12-Lead (Completed)   Coronary artery disease with stable angina Coronary calcification seen on CT scan, calcium  score greater than 400 CT scan images pulled up and reviewed with him calcium  predominantly in the LAD and RCA = Myoview  offered, he will think about it and call us  back if he would like this ordered - Recommended more aggressive management of hyperlipidemia and diabetes  Diabetes type 2 with complications A1c typically 6-7 range Diet restriction recommended, low carbohydrate diet for weight loss  Hyperlipidemia Goal LDL less than 55, recommend increase Lipitor up to 20, start Zetia  10 mg daily  Obesity We have  encouraged continued exercise, careful diet management in an effort to lose weight.   Signed, Juanda Noon, M.D., Ph.D. [facility] [place], [place] [phone]

## 2023-07-16 ENCOUNTER — Encounter: Payer: Self-pay | Admitting: Cardiovascular Disease

## 2023-07-16 ENCOUNTER — Ambulatory Visit: Attending: Cardiovascular Disease | Admitting: Cardiovascular Disease

## 2023-07-16 ENCOUNTER — Encounter: Payer: Self-pay | Admitting: Dermatology

## 2023-07-16 ENCOUNTER — Ambulatory Visit (INDEPENDENT_AMBULATORY_CARE_PROVIDER_SITE_OTHER): Admitting: Dermatology

## 2023-07-16 VITALS — BP 140/70 | HR 64 | Ht 70.0 in | Wt 242.0 lb

## 2023-07-16 DIAGNOSIS — Z1283 Encounter for screening for malignant neoplasm of skin: Secondary | ICD-10-CM

## 2023-07-16 DIAGNOSIS — L578 Other skin changes due to chronic exposure to nonionizing radiation: Secondary | ICD-10-CM

## 2023-07-16 DIAGNOSIS — Z85820 Personal history of malignant melanoma of skin: Secondary | ICD-10-CM

## 2023-07-16 DIAGNOSIS — W908XXA Exposure to other nonionizing radiation, initial encounter: Secondary | ICD-10-CM

## 2023-07-16 DIAGNOSIS — I10 Essential (primary) hypertension: Secondary | ICD-10-CM | POA: Diagnosis not present

## 2023-07-16 DIAGNOSIS — E785 Hyperlipidemia, unspecified: Secondary | ICD-10-CM

## 2023-07-16 DIAGNOSIS — D692 Other nonthrombocytopenic purpura: Secondary | ICD-10-CM

## 2023-07-16 DIAGNOSIS — L814 Other melanin hyperpigmentation: Secondary | ICD-10-CM

## 2023-07-16 DIAGNOSIS — L820 Inflamed seborrheic keratosis: Secondary | ICD-10-CM

## 2023-07-16 DIAGNOSIS — E1169 Type 2 diabetes mellitus with other specified complication: Secondary | ICD-10-CM | POA: Diagnosis not present

## 2023-07-16 DIAGNOSIS — D229 Melanocytic nevi, unspecified: Secondary | ICD-10-CM

## 2023-07-16 DIAGNOSIS — L570 Actinic keratosis: Secondary | ICD-10-CM | POA: Diagnosis not present

## 2023-07-16 DIAGNOSIS — I25118 Atherosclerotic heart disease of native coronary artery with other forms of angina pectoris: Secondary | ICD-10-CM

## 2023-07-16 DIAGNOSIS — L821 Other seborrheic keratosis: Secondary | ICD-10-CM | POA: Diagnosis not present

## 2023-07-16 DIAGNOSIS — E118 Type 2 diabetes mellitus with unspecified complications: Secondary | ICD-10-CM | POA: Diagnosis not present

## 2023-07-16 DIAGNOSIS — D1801 Hemangioma of skin and subcutaneous tissue: Secondary | ICD-10-CM | POA: Diagnosis not present

## 2023-07-16 MED ORDER — ATORVASTATIN CALCIUM 20 MG PO TABS: 20.0000 mg | ORAL_TABLET | Freq: Every day | ORAL | 3 refills | Status: AC

## 2023-07-16 MED ORDER — EZETIMIBE 10 MG PO TABS: 10.0000 mg | ORAL_TABLET | Freq: Every day | ORAL | 3 refills | Status: AC

## 2023-07-16 NOTE — Patient Instructions (Addendum)

## 2023-07-16 NOTE — Progress Notes (Signed)
 Follow-Up Visit   Subjective  Eric Stone. is a 78 y.o. male who presents for the following: Skin Cancer Screening and Full Body Skin Exam  The patient presents for Total-Body Skin Exam (TBSE) for skin cancer screening and mole check. The patient has spots, moles and lesions to be evaluated, some may be new or changing and the patient may have concern these could be cancer.  The following portions of the chart were reviewed this encounter and updated as appropriate: medications, allergies, medical history  Review of Systems:  No other skin or systemic complaints except as noted in HPI or Assessment and Plan.  Objective  Well appearing patient in no apparent distress; mood and affect are within normal limits.  A full examination was performed including scalp, head, eyes, ears, nose, lips, neck, chest, axillae, abdomen, back, buttocks, bilateral upper extremities, bilateral lower extremities, hands, feet, fingers, toes, fingernails, and toenails. All findings within normal limits unless otherwise noted below.   Relevant physical exam findings are noted in the Assessment and Plan.  Left Ear x 2 (2) Pink scaly macules face x 9, back, shoulders x 8, chest x 1 (18) Stuck on waxy paps with erythema  Assessment & Plan   SKIN CANCER SCREENING PERFORMED TODAY.  ACTINIC DAMAGE - Chronic condition, secondary to cumulative UV/sun exposure - diffuse scaly erythematous macules with underlying dyspigmentation - Recommend daily broad spectrum sunscreen SPF 30+ to sun-exposed areas, reapply every 2 hours as needed.  - Staying in the shade or wearing long sleeves, sun glasses (UVA+UVB protection) and wide brim hats (4-inch brim around the entire circumference of the hat) are also recommended for sun protection.  - Call for new or changing lesions.  LENTIGINES, SEBORRHEIC KERATOSES, HEMANGIOMAS - Benign normal skin lesions - Benign-appearing - Call for any changes  MELANOCYTIC NEVI -  Tan-brown and/or pink-flesh-colored symmetric macules and papules - Benign appearing on exam today - Observation - Call clinic for new or changing moles - Recommend daily use of broad spectrum spf 30+ sunscreen to sun-exposed areas.   HISTORY OF MELANOMA - No evidence of recurrence today - No lymphadenopathy - Recommend regular full body skin exams - Recommend daily broad spectrum sunscreen SPF 30+ to sun-exposed areas, reapply every 2 hours as needed.  - Call if any new or changing lesions are noted between office visits  - L prox med plantar great toe Breslow's 1.27mm, Clark level early 4 excised 03/16/2016  Purpura - Chronic; persistent and recurrent.  Treatable, but not curable. arms - Violaceous macules and patches - Benign - Related to trauma, age, sun damage and/or use of blood thinners, chronic use of topical and/or oral steroids - Observe - Can use OTC arnica containing moisturizer such as Dermend Bruise Formula if desired - Call for worsening or other concerns  AK (ACTINIC KERATOSIS) (2) Left Ear x 2 (2) Actinic keratoses are precancerous spots that appear secondary to cumulative UV radiation exposure/sun exposure over time. They are chronic with expected duration over 1 year. A portion of actinic keratoses will progress to squamous cell carcinoma of the skin. It is not possible to reliably predict which spots will progress to skin cancer and so treatment is recommended to prevent development of skin cancer.  Recommend daily broad spectrum sunscreen SPF 30+ to sun-exposed areas, reapply every 2 hours as needed.  Recommend staying in the shade or wearing long sleeves, sun glasses (UVA+UVB protection) and wide brim hats (4-inch brim around the entire circumference of the  hat). Call for new or changing lesions. Destruction of lesion - Left Ear x 2 (2) Complexity: simple   Destruction method: cryotherapy   Informed consent: discussed and consent obtained   Timeout:  patient  name, date of birth, surgical site, and procedure verified Lesion destroyed using liquid nitrogen: Yes   Region frozen until ice ball extended beyond lesion: Yes   Outcome: patient tolerated procedure well with no complications   Post-procedure details: wound care instructions given   INFLAMED SEBORRHEIC KERATOSIS (18) face x 9, back, shoulders x 8, chest x 1 (18) Symptomatic, irritating, patient would like treated. Destruction of lesion - face x 9, back, shoulders x 8, chest x 1 (18) Complexity: simple   Destruction method: cryotherapy   Informed consent: discussed and consent obtained   Timeout:  patient name, date of birth, surgical site, and procedure verified Lesion destroyed using liquid nitrogen: Yes   Region frozen until ice ball extended beyond lesion: Yes   Outcome: patient tolerated procedure well with no complications   Post-procedure details: wound care instructions given   Return in about 1 year (around 07/15/2024) for TBSE, Hx of Melanoma, Hx of Dysplastic nevi, Hx of AKs.  I, Rollie Clipper, RMA, am acting as scribe for Celine Collard, MD .   Documentation: I have reviewed the above documentation for accuracy and completeness, and I agree with the above.  Celine Collard, MD

## 2023-07-16 NOTE — Patient Instructions (Addendum)
 Monitor blood pressure at home  Medication Instructions:  Please increase the atorvastatin  up to 20 mg daily  Please start zetia  10 mg daily  If you need a refill on your cardiac medications before your next appointment, please call your pharmacy.   Lab work: No new labs needed  Testing/Procedures: No new testing needed  [facility] MYOVIEW  Your provider would like you to call if you would like a Stress Test with nuclear imaging. The purpose of this test is to evaluate the blood supply to your heart muscle. This procedure is referred to as a "Non-Invasive Stress Test." This is because other than having an IV started in your vein, nothing is inserted or "invades" your body. Cardiac stress tests are done to find areas of poor blood flow to the heart by determining the extent of coronary artery disease (CAD). Some patients exercise on a treadmill, which naturally increases the blood flow to your heart, while others who are unable to walk on a treadmill due to physical limitations will have a pharmacologic/chemical stress agent called Lexiscan . This medicine will mimic walking on a treadmill by temporarily increasing your coronary blood flow.   Please note: these test may take anywhere between 2-4 hours to complete  How to prepare for your Myoview test:  Nothing to eat for 6 hours prior to the test No caffeine for 24 hours prior to test No smoking 24 hours prior to test. Your medication may be taken with water.  If your doctor stopped a medication because of this test, do not take that medication. Ladies, please do not wear dresses.  Skirts or pants are appropriate. Please wear a short sleeve shirt. No perfume, cologne or lotion. Wear comfortable walking shoes. No heels!   PLEASE NOTIFY THE OFFICE AT LEAST 24 HOURS IN ADVANCE IF YOU ARE UNABLE TO KEEP YOUR APPOINTMENT.  [phone] AND  PLEASE NOTIFY NUCLEAR MEDICINE AT [facility] AT LEAST 24 HOURS IN ADVANCE IF YOU ARE UNABLE TO KEEP YOUR  APPOINTMENT. [phone]   Follow-Up: At [facility], you and your health needs are our priority.  As part of our continuing mission to provide you with exceptional heart care, we have created designated Provider Care Teams.  These Care Teams include your primary Cardiologist (physician) and Advanced Practice Providers (APPs -  Physician Assistants and Nurse Practitioners) who all work together to provide you with the care you need, when you need it.  You will need a follow up appointment in 12 months  Providers on your designated Care Team:   Laneta Pintos, NP Varney Gentleman, PA-C Cadence Gennaro Khat, [place]  COVID-19 Vaccine Information can be found at: [email or web address] For questions related to vaccine distribution or appointments, please email [email or web address] or call [phone].

## 2023-07-17 ENCOUNTER — Other Ambulatory Visit: Payer: Self-pay

## 2023-07-17 ENCOUNTER — Telehealth: Payer: Self-pay | Admitting: Cardiovascular Disease

## 2023-07-17 DIAGNOSIS — I25118 Atherosclerotic heart disease of native coronary artery with other forms of angina pectoris: Secondary | ICD-10-CM

## 2023-07-17 DIAGNOSIS — Z79899 Other long term (current) drug therapy: Secondary | ICD-10-CM

## 2023-07-17 DIAGNOSIS — R0600 Dyspnea, unspecified: Secondary | ICD-10-CM

## 2023-07-17 NOTE — Telephone Encounter (Signed)
 Pt calling to schedule stress test that was discussed at ov on 4/22 but no order. Requesting cb

## 2023-07-17 NOTE — Telephone Encounter (Signed)
 Spoke with the patient, who stated he is ready to proceed with the stress test as recommended by Dr. Gollan. Will forward to the MD for awareness and approval to place the order.

## 2023-07-19 ENCOUNTER — Other Ambulatory Visit: Payer: Self-pay

## 2023-07-19 ENCOUNTER — Encounter: Payer: Self-pay | Admitting: Cardiovascular Disease

## 2023-07-19 ENCOUNTER — Other Ambulatory Visit: Payer: Self-pay | Admitting: Pharmacy Technician

## 2023-07-19 ENCOUNTER — Other Ambulatory Visit: Payer: Self-pay | Admitting: Oncology

## 2023-07-19 DIAGNOSIS — C61 Malignant neoplasm of prostate: Secondary | ICD-10-CM

## 2023-07-19 MED ORDER — APALUTAMIDE 60 MG PO TABS
240.0000 mg | ORAL_TABLET | Freq: Every day | ORAL | 3 refills | Status: DC
  Filled 2023-07-22: qty 120, 30d supply, fill #0
  Filled 2023-08-15: qty 120, 30d supply, fill #1
  Filled 2023-09-16: qty 120, 30d supply, fill #2
  Filled 2023-10-10 – 2023-10-11 (×2): qty 120, 30d supply, fill #3

## 2023-07-19 NOTE — Progress Notes (Signed)
 Specialty Pharmacy Refill Coordination Note  Eric Stone. is a 78 y.o. male contacted today regarding refills of specialty medication(s) Apalutamide  (ERLEADA )   Patient requested (Patient-Rptd) Delivery   Delivery date: (Patient-Rptd) 07/26/23   Verified address: (Patient-Rptd) [address], [place] [address]   Medication will be filled on 07/25/23.   RR sent to MD

## 2023-07-22 ENCOUNTER — Other Ambulatory Visit: Payer: Self-pay

## 2023-07-25 ENCOUNTER — Ambulatory Visit: Payer: PPO | Admitting: Family Medicine

## 2023-07-25 ENCOUNTER — Other Ambulatory Visit: Payer: Self-pay

## 2023-07-29 ENCOUNTER — Encounter: Payer: Self-pay | Admitting: Oncology

## 2023-07-29 ENCOUNTER — Inpatient Hospital Stay (HOSPITAL_BASED_OUTPATIENT_CLINIC_OR_DEPARTMENT_OTHER): Payer: PPO | Admitting: Oncology

## 2023-07-29 ENCOUNTER — Inpatient Hospital Stay: Payer: PPO | Attending: Oncology

## 2023-07-29 DIAGNOSIS — Z809 Family history of malignant neoplasm, unspecified: Secondary | ICD-10-CM | POA: Insufficient documentation

## 2023-07-29 DIAGNOSIS — C61 Malignant neoplasm of prostate: Secondary | ICD-10-CM | POA: Diagnosis not present

## 2023-07-29 DIAGNOSIS — Z87891 Personal history of nicotine dependence: Secondary | ICD-10-CM | POA: Diagnosis not present

## 2023-07-29 DIAGNOSIS — Z79899 Other long term (current) drug therapy: Secondary | ICD-10-CM

## 2023-07-29 LAB — CBC WITH DIFFERENTIAL (CANCER CENTER ONLY)
Abs Immature Granulocytes: 0.03 10*3/uL (ref 0.00–0.07)
Basophils Absolute: 0.1 10*3/uL (ref 0.0–0.1)
Basophils Relative: 1 %
Eosinophils Absolute: 0.3 10*3/uL (ref 0.0–0.5)
Eosinophils Relative: 3 %
HCT: 39.4 % (ref 39.0–52.0)
Hemoglobin: 13.1 g/dL (ref 13.0–17.0)
Immature Granulocytes: 0 %
Lymphocytes Relative: 18 %
Lymphs Abs: 1.4 10*3/uL (ref 0.7–4.0)
MCH: 29.3 pg (ref 26.0–34.0)
MCHC: 33.2 g/dL (ref 30.0–36.0)
MCV: 88.1 fL (ref 80.0–100.0)
Monocytes Absolute: 0.5 10*3/uL (ref 0.1–1.0)
Monocytes Relative: 7 %
Neutro Abs: 5.5 10*3/uL (ref 1.7–7.7)
Neutrophils Relative %: 71 %
Platelet Count: 219 10*3/uL (ref 150–400)
RBC: 4.47 MIL/uL (ref 4.22–5.81)
RDW: 13.2 % (ref 11.5–15.5)
WBC Count: 7.7 10*3/uL (ref 4.0–10.5)
nRBC: 0 % (ref 0.0–0.2)

## 2023-07-29 LAB — CMP (CANCER CENTER ONLY)
ALT: 12 U/L (ref 0–44)
AST: 17 U/L (ref 15–41)
Albumin: 3.9 g/dL (ref 3.5–5.0)
Alkaline Phosphatase: 67 U/L (ref 38–126)
Anion gap: 12 (ref 5–15)
BUN: 21 mg/dL (ref 8–23)
CO2: 21 mmol/L — ABNORMAL LOW (ref 22–32)
Calcium: 8.5 mg/dL — ABNORMAL LOW (ref 8.9–10.3)
Chloride: 108 mmol/L (ref 98–111)
Creatinine: 1.17 mg/dL (ref 0.61–1.24)
GFR, Estimated: >60 mL/min (ref >60)
Glucose, Bld: 106 mg/dL — ABNORMAL HIGH (ref 70–99)
Potassium: 4.1 mmol/L (ref 3.5–5.1)
Sodium: 141 mmol/L (ref 135–145)
Total Bilirubin: 0.2 mg/dL (ref 0.0–1.2)
Total Protein: 6.7 g/dL (ref 6.5–8.1)

## 2023-07-29 LAB — PSA: Prostatic Specific Antigen: <0.01 ng/mL (ref 0.00–4.00)

## 2023-07-29 NOTE — Progress Notes (Signed)
 Hematology/Oncology Consult note [facility]  [phone] [phone]  Patient Care Team: Raina Bunting, DO as PCP - General (Family Medicine) Zoila Hines Sherryn Donalds., MD (Inactive) (Unknown Physician Specialty) Norita Beauvais, MD (Inactive) as Consulting Physician (Cardiothoracic Surgery) Clance, Deena Farrier, MD as Consulting Physician (Pulmonary Disease) Norita Beauvais, MD (Inactive) as Consulting Physician (Cardiothoracic Surgery) Clance, Deena Farrier, MD as Consulting Physician (Pulmonary Disease) Minor, Carma Chime, RN (Inactive) as Case Manager Avonne Boettcher, MD as Consulting Physician (Hematology and Oncology)   Name of the patient: Eric Stone  [id number]  1945-12-04   Date of visit: 07/29/23  Diagnosis-castrate resistant nonmetastatic prostate cancer  Chief complaint/ Reason for visit-routine follow-up of prostate cancer on Eligard  and apalutamide   Heme/Onc history:  patient is a 78 year old male who was diagnosed with Gleason 6 adenocarcinoma of the prostateAbout 5 years ago and received radiation treatment.  He has not undergone the surgery for his prostate but has seen Dr. Cherylene Corrente in the past.  He was receiving intermittent ADT and last received 45 mg of Eligard  on 09/30/2018.  His PSA following that went down to 0.87 December 2020.  He did not receive any ADT after that and his most recent PSA on 09/24/2019 was elevated at 5.08.  Patient reports having significant hot flashes and fatigue when he gets ADT.    Patient presently on continuous ADT since July 2021 which she gets every 6 months.  Bone scan did not show any evidence of metastatic disease.   Patient noted to have PSA doubling time of about 3 months in August 2022.  CT abdomen and bone scan did not show any evidence of distant metastatic disease.Patient started on apalutamide  for castrate resistant nonmetastatic prostate cancer in August 2022    Interval history-patient  is undergoing cardiology evaluation and will be getting stress test next week.  Denies any chest pain presently.  Tolerating apalutamide  well without significant side effects.  Stable leg swelling  ECOG PS- 1 Pain scale- 0   Review of systems- Review of Systems  Constitutional:  Negative for chills, fever, malaise/fatigue and weight loss.  HENT:  Negative for congestion, ear discharge and nosebleeds.   Eyes:  Negative for blurred vision.  Respiratory:  Negative for cough, hemoptysis, sputum production, shortness of breath and wheezing.   Cardiovascular:  Negative for chest pain, palpitations, orthopnea and claudication.  Gastrointestinal:  Negative for abdominal pain, blood in stool, constipation, diarrhea, heartburn, melena, nausea and vomiting.  Genitourinary:  Negative for dysuria, flank pain, frequency, hematuria and urgency.  Musculoskeletal:  Negative for back pain, joint pain and myalgias.  Skin:  Negative for rash.  Neurological:  Negative for dizziness, tingling, focal weakness, seizures, weakness and headaches.  Endo/Heme/Allergies:  Does not bruise/bleed easily.  Psychiatric/Behavioral:  Negative for depression and suicidal ideas. The patient does not have insomnia.       Allergies  Allergen Reactions   Penicillins Other (See Comments)    As a child     Past Medical History:  Diagnosis Date   Actinic keratosis    Arthritis    H/O blood clots    H/O blood clots    in left leg in 1988   Hx of dysplastic nevus 06/18/2013   L lat ankle - mild   Melanoma (HCC) 02/21/2016   L prox med plantar great toe - Breslow's 1.8mm, Clark level early 4, excised [facility]/16/2016 [facility]   Prostate cancer [facility])    Rash  occasionally and pt does see a dermatologist   Sleep apnea    uses a CPAP;sleep study >11yrs ago     Past Surgical History:  Procedure Laterality Date   TOE SURGERY     melanoma   TOTAL KNEE ARTHROPLASTY  2009-2010   bilateral   VIDEO ASSISTED THORACOSCOPY  (VATS)/DECORTICATION Left 05/06/2012   Procedure: VIDEO ASSISTED THORACOSCOPY (VATS) drainage loculated pleural effusion;  Surgeon: Norita Beauvais, MD;  Location: [facility] OR;  Service: Thoracic;  Laterality: Left;   VIDEO BRONCHOSCOPY N/A 05/06/2012   Procedure: VIDEO BRONCHOSCOPY;  Surgeon: Norita Beauvais, MD;  Location: [facility] OR;  Service: Thoracic;  Laterality: N/A;    Social History   Socioeconomic History   Marital status: Married    Spouse name: Not on file   Number of children: Not on file   Years of education: tech school   Highest education level: High school graduate  Occupational History   Occupation: retired  Tobacco Use   Smoking status: Former    Current packs/day: 0.00    Average packs/day: 1 pack/day for 26.0 years (26.0 ttl pk-yrs)    Types: Cigarettes    Start date: 03/27/1963    Quit date: 03/26/1989    Years since quitting: 34.3   Smokeless tobacco: Former   Tobacco comments:    less than 1 PPD  Vaping Use   Vaping status: Never Used  Substance and Sexual Activity   Alcohol use: No    Alcohol/week: 0.0 standard drinks of alcohol   Drug use: No   Sexual activity: Yes  Other Topics Concern   Not on file  Social History Narrative   Not on file   Social Drivers of Health   Financial Resource Strain: Low Risk  (01/07/2018)   Overall Financial Resource Strain (CARDIA)    Difficulty of Paying Living Expenses: Not hard at all  Food Insecurity: No Food Insecurity (01/07/2018)   Hunger Vital Sign    Worried About Running Out of Food in the Last Year: Never true    Ran Out of Food in the Last Year: Never true  Transportation Needs: No Transportation Needs (01/07/2018)   PRAPARE - Administrator, Civil Service (Medical): No    Lack of Transportation (Non-Medical): No  Physical Activity: Inactive (01/07/2018)   Exercise Vital Sign    Days of Exercise per Week: 0 days    Minutes of Exercise per Session: 0 min  Stress: No Stress Concern Present  (01/07/2018)   [employer] of Occupational Health - Occupational Stress Questionnaire    Feeling of Stress : Not at all  Social Connections: Moderately Integrated (01/07/2018)   Social Connection and Isolation Panel [NHANES]    Frequency of Communication with Friends and Family: More than three times a week    Frequency of Social Gatherings with Friends and Family: More than three times a week    Attends Religious Services: More than 4 times per year    Active Member of [employer] or Organizations: No    Attends Banker Meetings: Never    Marital Status: Married  Catering manager Violence: Not At Risk (01/07/2018)   Humiliation, Afraid, Rape, and Kick questionnaire    Fear of Current or Ex-Partner: No    Emotionally Abused: No    Physically Abused: No    Sexually Abused: No    Family History  Problem Relation Age of Onset   Hypertension Mother    Stroke Mother  possible   Cancer Brother    Heart attack Brother    Heart disease Brother        stent placement     Current Outpatient Medications:    apalutamide  (ERLEADA ) 60 MG tablet, Take 4 tablets (240 mg total) by mouth daily., Disp: 120 tablet, Rfl: 3   aspirin  EC 81 MG tablet, Take 81 mg by mouth., Disp: , Rfl:    atorvastatin  (LIPITOR) 20 MG tablet, Take 1 tablet (20 mg total) by mouth at bedtime., Disp: 90 tablet, Rfl: 3   ezetimibe  (ZETIA ) 10 MG tablet, Take 1 tablet (10 mg total) by mouth daily., Disp: 90 tablet, Rfl: 3   hydrocortisone  2.5 % cream, Apply T, TH, S at bedtime to rash at abdomen., Disp: 30 g, Rfl: 11   ketoconazole  (NIZORAL ) 2 % cream, Apply M, W, F at bedtime to rash at abdomen., Disp: 30 g, Rfl: 11   lisinopril  (ZESTRIL ) 30 MG tablet, Take 1 tablet (30 mg total) by mouth daily., Disp: 90 tablet, Rfl: 3   pimecrolimus  (ELIDEL ) 1 % cream, APPLY   TOPICALLY TO AFFECTED AREA IN GROIN TWICE DAILY, Disp: 60 g, Rfl: 11  Physical exam:  Vitals:   07/29/23 1405  BP: (!) 151/85  Pulse: 76   Resp: 19  Temp: 98.6 F (37 C)  SpO2: 96%  Weight: 241 lb 3.2 oz (109.4 kg)   Physical Exam Cardiovascular:     Rate and Rhythm: Normal rate and regular rhythm.     Heart sounds: Normal heart sounds.  Pulmonary:     Effort: Pulmonary effort is normal.     Breath sounds: Normal breath sounds.  Musculoskeletal:     Right lower leg: Edema present.     Left lower leg: Edema present.  Skin:    General: Skin is warm and dry.  Neurological:     Mental Status: He is alert and oriented to person, place, and time.      I have personally reviewed labs listed below:    Latest Ref Rng & Units 07/29/2023    1:46 PM  CMP  Glucose 70 - 99 mg/dL 962   BUN 8 - 23 mg/dL 21   Creatinine 9.52 - 1.24 mg/dL 8.41   Sodium 324 - 401 mmol/L 141   Potassium 3.5 - 5.1 mmol/L 4.1   Chloride 98 - 111 mmol/L 108   CO2 22 - 32 mmol/L 21   Calcium  8.9 - 10.3 mg/dL 8.5   Total Protein 6.5 - 8.1 g/dL 6.7   Total Bilirubin 0.0 - 1.2 mg/dL 0.2   Alkaline Phos 38 - 126 U/L 67   AST 15 - 41 U/L 17   ALT 0 - 44 U/L 12       Latest Ref Rng & Units 07/29/2023    1:46 PM  CBC  WBC 4.0 - 10.5 K/uL 7.7   Hemoglobin 13.0 - 17.0 g/dL 02.7   Hematocrit 25.3 - 52.0 % 39.4   Platelets 150 - 400 K/uL 219    I have personally reviewed Radiology images listed below: No images are attached to the encounter.  CT CARDIAC SCORING (SELF PAY ONLY) Addendum Date: 07/05/2023 ADDENDUM REPORT: 07/05/2023 12:53 ADDENDUM: OVER-READ INTERPRETATION  CT CHEST The following report is an over-read performed by radiologist Dr. Avanell Bob [facility] on 06/14/2023. This over-read does not include interpretation of cardiac or coronary anatomy or pathology. The interpretation by the cardiologist is attached. COMPARISON:  Choose 1 CT chest April 29, 2012 FINDINGS: Within the visualized portions of the lungs demonstrates no acute infiltrates consolidations.  No pulmonary nodules or masses. No pleural effusions. No  mediastinal masses or adenopathy. No pericardial effusions. Aorta appears normal caliber. No acute findings in the upper abdomen. Chest wall and soft tissues are unremarkable without evidence of acute bony abnormalities. Extensive coronary artery calcifications IMPRESSION: *No acute findings. *Extensive coronary artery calcifications. Electronically Signed   By: Fredrich Jefferson M.D.   On: 07/05/2023 12:53   Result Date: 07/05/2023 CLINICAL DATA:  Risk stratification EXAM: Coronary Calcium  Score TECHNIQUE: The patient was scanned on a Siemens Somatom scanner. Axial non-contrast 3 mm slices were carried out through the heart. The data set was analyzed on a dedicated work station and scored using the Agatson method. FINDINGS: Non-cardiac: See separate report from [facility]. Ascending Aorta: Normal size Pericardium: Normal Coronary arteries: Normal origin of left and right coronary arteries. Distribution of arterial calcifications if present, as noted below; LM 0 LAD 258 LCx 7.02 RCA 204 Total 469 IMPRESSION AND RECOMMENDATION: 1. Coronary calcium  score of 469. This was 61st percentile for age and sex matched control. 2. CAC >300 in LAD, LCx, RCA. CAC-DRS A3/N3. 3. Recommend aspirin  and statin if no contraindication. 4. Recommend cardiology consultation. 5. Continue heart healthy lifestyle and risk factor modification. Electronically Signed: By: Constancia Delton M.D. On: 07/03/2023 08:54     Assessment and plan- Patient is a 78 y.o. male with history of castrate resistant nonmetastatic prostate cancer on Eligard  and apalutamide  here for routine follow-up  PSA currently remains undetectable and he will continue Eligard  plus apalutamide  until progression or toxicity.  He is overdue to get a bone density scan which was not scheduled for last visit and I will plan to get it done within the next couple of weeks.  I will seeHim back in 3 months with CBC with differential CMP and PSA   Visit Diagnosis 1.  Malignant neoplasm of prostate (HCC)   2. High risk medication use      Dr. Seretha Dance, MD, MPH [place] at [facility] [phone] 07/29/2023 3:58 PM

## 2023-08-01 ENCOUNTER — Ambulatory Visit
Admission: RE | Admit: 2023-08-01 | Discharge: 2023-08-01 | Disposition: A | Discharge status: Home / Self Care | Source: Ambulatory Visit | Attending: Oncology | Admitting: Oncology

## 2023-08-01 DIAGNOSIS — Z79899 Other long term (current) drug therapy: Secondary | ICD-10-CM | POA: Diagnosis not present

## 2023-08-01 DIAGNOSIS — C61 Malignant neoplasm of prostate: Secondary | ICD-10-CM | POA: Diagnosis not present

## 2023-08-01 DIAGNOSIS — Z13820 Encounter for screening for osteoporosis: Secondary | ICD-10-CM | POA: Diagnosis not present

## 2023-08-07 DIAGNOSIS — G4733 Obstructive sleep apnea (adult) (pediatric): Secondary | ICD-10-CM | POA: Diagnosis not present

## 2023-08-09 ENCOUNTER — Encounter
Admission: RE | Admit: 2023-08-09 | Discharge: 2023-08-09 | Disposition: A | Discharge status: Home / Self Care | Source: Ambulatory Visit | Attending: Cardiovascular Disease | Admitting: Cardiovascular Disease

## 2023-08-09 DIAGNOSIS — I25118 Atherosclerotic heart disease of native coronary artery with other forms of angina pectoris: Secondary | ICD-10-CM | POA: Insufficient documentation

## 2023-08-09 DIAGNOSIS — R0600 Dyspnea, unspecified: Secondary | ICD-10-CM | POA: Diagnosis not present

## 2023-08-09 LAB — NM MYOCAR MULTI W/SPECT W/WALL MOTION / EF
LV dias vol: 135 mL (ref 62–150)
LV sys vol: 54 mL
MPHR: 144 {beats}/min
Nuc Stress EF: 60 %
Peak HR: 74 {beats}/min
Percent HR: 51 %
Rest HR: 58 {beats}/min
Rest Nuclear Isotope Dose: 10.6 mCi
SDS: 0
SRS: 9
SSS: 6
Stress Nuclear Isotope Dose: 31.2 mCi
TID: 1.06

## 2023-08-09 MED ORDER — TECHNETIUM TC 99M TETROFOSMIN IV KIT
10.5900 | PACK | Freq: Once | INTRAVENOUS | Status: AC | PRN
  Administered 2023-08-09: 10.59 via INTRAVENOUS

## 2023-08-09 MED ORDER — TECHNETIUM TC 99M TETROFOSMIN IV KIT
31.1600 | PACK | Freq: Once | INTRAVENOUS | Status: AC | PRN
  Administered 2023-08-09: 31.16 via INTRAVENOUS

## 2023-08-09 MED ORDER — REGADENOSON 0.4 MG/5ML IV SOLN
0.4000 mg | Freq: Once | INTRAVENOUS | Status: AC
Start: 2023-08-09 — End: 2023-08-09
  Administered 2023-08-09: 0.4 mg via INTRAVENOUS
  Filled 2023-08-09: qty 5

## 2023-08-11 ENCOUNTER — Ambulatory Visit: Payer: Self-pay | Admitting: Cardiovascular Disease

## 2023-08-12 ENCOUNTER — Encounter: Payer: Self-pay | Admitting: Emergency Medicine

## 2023-08-13 ENCOUNTER — Other Ambulatory Visit: Payer: Self-pay

## 2023-08-15 ENCOUNTER — Other Ambulatory Visit: Payer: Self-pay

## 2023-08-15 NOTE — Progress Notes (Signed)
 Specialty Pharmacy Refill Coordination Note  Eric Stone. is a 77 y.o. male contacted today regarding refills of specialty medication(s) Apalutamide  (ERLEADA )   Patient requested Delivery   Delivery date: 08/21/23   Verified address: [address]   [facility] [address]   Medication will be filled on 08/20/23.

## 2023-09-12 ENCOUNTER — Other Ambulatory Visit: Payer: Self-pay

## 2023-09-16 ENCOUNTER — Other Ambulatory Visit: Payer: Self-pay

## 2023-09-16 ENCOUNTER — Other Ambulatory Visit: Payer: Self-pay | Admitting: Pharmacy Technician

## 2023-09-16 NOTE — Progress Notes (Signed)
 Specialty Pharmacy Refill Coordination Note  Eric Belmar. is a 78 y.o. male contacted today regarding refills of specialty medication(s) Apalutamide  (ERLEADA )   Patient requested Delivery   Delivery date: 09/18/23   Verified address: [address]  [place]   Medication will be filled on 09/17/23.

## 2023-09-17 ENCOUNTER — Other Ambulatory Visit: Payer: Self-pay

## 2023-10-10 ENCOUNTER — Other Ambulatory Visit: Payer: Self-pay

## 2023-10-11 ENCOUNTER — Other Ambulatory Visit: Payer: Self-pay

## 2023-10-15 ENCOUNTER — Other Ambulatory Visit (HOSPITAL_COMMUNITY): Payer: Self-pay

## 2023-10-15 ENCOUNTER — Other Ambulatory Visit: Payer: Self-pay

## 2023-10-15 NOTE — Progress Notes (Signed)
 Specialty Pharmacy Refill Coordination Note  Spoke with Eric Stone. is a 78 y.o. male contacted today regarding refills of specialty medication(s) Apalutamide  (ERLEADA )  Doses on hand: 6  Patient requested: Delivery   Delivery date: 10/18/23   Verified address: [address]  [place] [address]  Medication will be filled on 10/17/23.

## 2023-10-16 ENCOUNTER — Other Ambulatory Visit: Payer: Self-pay

## 2023-10-23 ENCOUNTER — Other Ambulatory Visit: Payer: Self-pay

## 2023-10-29 ENCOUNTER — Other Ambulatory Visit: Payer: Self-pay

## 2023-11-05 ENCOUNTER — Inpatient Hospital Stay: Admitting: Oncology

## 2023-11-05 ENCOUNTER — Other Ambulatory Visit: Payer: Self-pay

## 2023-11-05 ENCOUNTER — Encounter: Payer: Self-pay | Admitting: Oncology

## 2023-11-05 ENCOUNTER — Other Ambulatory Visit: Payer: Self-pay | Admitting: Oncology

## 2023-11-05 ENCOUNTER — Inpatient Hospital Stay: Attending: Oncology

## 2023-11-05 ENCOUNTER — Inpatient Hospital Stay

## 2023-11-05 VITALS — BP 138/74 | HR 66 | Temp 98.6°F | Resp 17 | Wt 245.0 lb

## 2023-11-05 DIAGNOSIS — Z79818 Long term (current) use of other agents affecting estrogen receptors and estrogen levels: Secondary | ICD-10-CM | POA: Diagnosis not present

## 2023-11-05 DIAGNOSIS — C61 Malignant neoplasm of prostate: Secondary | ICD-10-CM

## 2023-11-05 DIAGNOSIS — Z79899 Other long term (current) drug therapy: Secondary | ICD-10-CM | POA: Insufficient documentation

## 2023-11-05 DIAGNOSIS — Z5111 Encounter for antineoplastic chemotherapy: Secondary | ICD-10-CM | POA: Diagnosis not present

## 2023-11-05 DIAGNOSIS — Z5181 Encounter for therapeutic drug level monitoring: Secondary | ICD-10-CM | POA: Diagnosis not present

## 2023-11-05 LAB — CBC WITH DIFFERENTIAL (CANCER CENTER ONLY)
Abs Immature Granulocytes: 0.03 K/uL (ref 0.00–0.07)
Basophils Absolute: 0.1 K/uL (ref 0.0–0.1)
Basophils Relative: 1 %
Eosinophils Absolute: 0.2 K/uL (ref 0.0–0.5)
Eosinophils Relative: 3 %
HCT: 38.1 % — ABNORMAL LOW (ref 39.0–52.0)
Hemoglobin: 12.6 g/dL — ABNORMAL LOW (ref 13.0–17.0)
Immature Granulocytes: 0 %
Lymphocytes Relative: 19 %
Lymphs Abs: 1.5 K/uL (ref 0.7–4.0)
MCH: 29.6 pg (ref 26.0–34.0)
MCHC: 33.1 g/dL (ref 30.0–36.0)
MCV: 89.4 fL (ref 80.0–100.0)
Monocytes Absolute: 0.6 K/uL (ref 0.1–1.0)
Monocytes Relative: 8 %
Neutro Abs: 5.6 K/uL (ref 1.7–7.7)
Neutrophils Relative %: 69 %
Platelet Count: 220 K/uL (ref 150–400)
RBC: 4.26 MIL/uL (ref 4.22–5.81)
RDW: 13.4 % (ref 11.5–15.5)
WBC Count: 8.1 K/uL (ref 4.0–10.5)
nRBC: 0 % (ref 0.0–0.2)

## 2023-11-05 LAB — CMP (CANCER CENTER ONLY)
ALT: 12 U/L (ref 0–44)
AST: 17 U/L (ref 15–41)
Albumin: 3.6 g/dL (ref 3.5–5.0)
Alkaline Phosphatase: 65 U/L (ref 38–126)
Anion gap: 8 (ref 5–15)
BUN: 21 mg/dL (ref 8–23)
CO2: 25 mmol/L (ref 22–32)
Calcium: 9.2 mg/dL (ref 8.9–10.3)
Chloride: 105 mmol/L (ref 98–111)
Creatinine: 0.99 mg/dL (ref 0.61–1.24)
GFR, Estimated: >60 mL/min (ref >60)
Glucose, Bld: 147 mg/dL — ABNORMAL HIGH (ref 70–99)
Potassium: 3.9 mmol/L (ref 3.5–5.1)
Sodium: 138 mmol/L (ref 135–145)
Total Bilirubin: 0.2 mg/dL (ref 0.0–1.2)
Total Protein: 6.4 g/dL — ABNORMAL LOW (ref 6.5–8.1)

## 2023-11-05 LAB — PSA: Prostatic Specific Antigen: 0.01 ng/mL (ref 0.00–4.00)

## 2023-11-05 MED ORDER — LEUPROLIDE ACETATE (6 MONTH) 45 MG ~~LOC~~ KIT
45.0000 mg | PACK | Freq: Once | SUBCUTANEOUS | Status: AC
Start: 2023-11-05 — End: 2023-11-05
  Administered 2023-11-05 (×2): 45 mg via SUBCUTANEOUS
  Filled 2023-11-05: qty 45

## 2023-11-07 ENCOUNTER — Encounter: Payer: Self-pay | Admitting: Oncology

## 2023-11-07 MED ORDER — APALUTAMIDE 60 MG PO TABS
240.0000 mg | ORAL_TABLET | Freq: Every day | ORAL | 3 refills | Status: DC
  Filled 2023-11-08 (×2): qty 120, 30d supply, fill #0
  Filled 2023-12-10 – 2024-01-20 (×2): qty 120, 30d supply, fill #1
  Filled 2024-02-10 – 2024-02-13 (×2): qty 120, 30d supply, fill #2
  Filled 2024-03-10: qty 120, 30d supply, fill #3

## 2023-11-07 NOTE — Progress Notes (Signed)
 Hematology/Oncology Consult note [facility]  [phone] [phone]  Patient Care Team: Edman Marsa PARAS, DO as PCP - General (Family Medicine) Vonzell Lynwood VEAR Raddle., MD (Inactive) (Unknown Physician Specialty) Army Dallas NOVAK, MD (Inactive) as Consulting Physician (Cardiothoracic Surgery) Clance, Francis HERO, MD as Consulting Physician (Pulmonary Disease) Army Dallas NOVAK, MD (Inactive) as Consulting Physician (Cardiothoracic Surgery) Clance, Francis HERO, MD as Consulting Physician (Pulmonary Disease) Melanee Annah BROCKS, MD as Consulting Physician (Oncology)   Name of the patient: Eric Stone  [id number]  [birth year]   Date of visit: 11/07/23  Diagnosis- castrate resistant nonmetastatic prostate cancer   Chief complaint/ Reason for visit- routine f/u of prostate cancer on eligard  and apalutamide   Heme/Onc history: patient is a 78 year old male who was diagnosed with Gleason 6 adenocarcinoma of the prostateAbout 5 years ago and received radiation treatment.  He has not undergone the surgery for his prostate but has seen Dr. Twylla in the past.  He was receiving intermittent ADT and last received 45 mg of Eligard  on 09/30/2018.  His PSA following that went down to 0.87 December 2020.  He did not receive any ADT after that and his most recent PSA on 09/24/2019 was elevated at 5.08.  Patient reports having significant hot flashes and fatigue when he gets ADT.    Patient presently on continuous ADT since July 2021 which she gets every 6 months.  Bone scan did not show any evidence of metastatic disease.   Patient noted to have PSA doubling time of about 3 months in August 2022.  CT abdomen and bone scan did not show any evidence of distant metastatic disease.Patient started on apalutamide  for castrate resistant nonmetastatic prostate cancer in August 2022    Interval history- he is tolerating apalutamide  well. He has baseline fatigue and leg  swelling which has remained stable.   ECOG PS- 1 Pain scale- 0   Review of systems- Review of Systems  Constitutional:  Positive for malaise/fatigue. Negative for chills, fever and weight loss.  HENT:  Negative for congestion, ear discharge and nosebleeds.   Eyes:  Negative for blurred vision.  Respiratory:  Negative for cough, hemoptysis, sputum production, shortness of breath and wheezing.   Cardiovascular:  Positive for leg swelling. Negative for chest pain, palpitations, orthopnea and claudication.  Gastrointestinal:  Negative for abdominal pain, blood in stool, constipation, diarrhea, heartburn, melena, nausea and vomiting.  Genitourinary:  Negative for dysuria, flank pain, frequency, hematuria and urgency.  Musculoskeletal:  Negative for back pain, joint pain and myalgias.  Skin:  Negative for rash.  Neurological:  Negative for dizziness, tingling, focal weakness, seizures, weakness and headaches.  Endo/Heme/Allergies:  Does not bruise/bleed easily.  Psychiatric/Behavioral:  Negative for depression and suicidal ideas. The patient does not have insomnia.       Allergies  Allergen Reactions   Penicillins Other (See Comments)    As a child     Past Medical History:  Diagnosis Date   Actinic keratosis    Arthritis    H/O blood clots    H/O blood clots    in left leg in 1988   Hx of dysplastic nevus 06/18/2013   L lat ankle - mild   Melanoma (HCC) 02/21/2016   L prox med plantar great toe - Breslow's 1.57mm, Clark level early 4, excised 03/16/2016 at [facility]   Prostate cancer [facility])    Rash    occasionally and pt does see a dermatologist   Sleep apnea  uses a CPAP;sleep study >27yrs ago     Past Surgical History:  Procedure Laterality Date   TOE SURGERY     melanoma   TOTAL KNEE ARTHROPLASTY  2009-2010   bilateral   VIDEO ASSISTED THORACOSCOPY (VATS)/DECORTICATION Left 05/06/2012   Procedure: VIDEO ASSISTED THORACOSCOPY (VATS) drainage loculated pleural effusion;   Surgeon: Dallas KATHEE Jude, MD;  Location: [facility] OR;  Service: Thoracic;  Laterality: Left;   VIDEO BRONCHOSCOPY N/A 05/06/2012   Procedure: VIDEO BRONCHOSCOPY;  Surgeon: Dallas KATHEE Jude, MD;  Location: [facility] OR;  Service: Thoracic;  Laterality: N/A;    Social History   Socioeconomic History   Marital status: Married    Spouse name: Not on file   Number of children: Not on file   Years of education: tech school   Highest education level: High school graduate  Occupational History   Occupation: retired  Tobacco Use   Smoking status: Former    Current packs/day: 0.00    Average packs/day: 1 pack/day for 26.0 years (26.0 ttl pk-yrs)    Types: Cigarettes    Start date: 03/27/1963    Quit date: 03/26/1989    Years since quitting: 34.6   Smokeless tobacco: Former   Tobacco comments:    less than 1 PPD  Vaping Use   Vaping status: Never Used  Substance and Sexual Activity   Alcohol use: No    Alcohol/week: 0.0 standard drinks of alcohol   Drug use: No   Sexual activity: Yes  Other Topics Concern   Not on file  Social History Narrative   Not on file   Social Drivers of Health   Financial Resource Strain: Low Risk  (01/07/2018)   Overall Financial Resource Strain (CARDIA)    Difficulty of Paying Living Expenses: Not hard at all  Food Insecurity: No Food Insecurity (01/07/2018)   Hunger Vital Sign    Worried About Running Out of Food in the Last Year: Never true    Ran Out of Food in the Last Year: Never true  Transportation Needs: No Transportation Needs (01/07/2018)   PRAPARE - Administrator, Civil Service (Medical): No    Lack of Transportation (Non-Medical): No  Physical Activity: Inactive (01/07/2018)   Exercise Vital Sign    Days of Exercise per Week: 0 days    Minutes of Exercise per Session: 0 min  Stress: No Stress Concern Present (01/07/2018)   [employer] of Occupational Health - Occupational Stress Questionnaire    Feeling of Stress : Not at all   Social Connections: Moderately Integrated (01/07/2018)   Social Connection and Isolation Panel    Frequency of Communication with Friends and Family: More than three times a week    Frequency of Social Gatherings with Friends and Family: More than three times a week    Attends Religious Services: More than 4 times per year    Active Member of [employer] or Organizations: No    Attends Banker Meetings: Never    Marital Status: Married  Catering manager Violence: Not At Risk (01/07/2018)   Humiliation, Afraid, Rape, and Kick questionnaire    Fear of Current or Ex-Partner: No    Emotionally Abused: No    Physically Abused: No    Sexually Abused: No    Family History  Problem Relation Age of Onset   Hypertension Mother    Stroke Mother        possible   Cancer Brother    Heart attack Brother  Heart disease Brother        stent placement     Current Outpatient Medications:    aspirin  EC 81 MG tablet, Take 81 mg by mouth., Disp: , Rfl:    atorvastatin  (LIPITOR) 20 MG tablet, Take 1 tablet (20 mg total) by mouth at bedtime., Disp: 90 tablet, Rfl: 3   ezetimibe  (ZETIA ) 10 MG tablet, Take 1 tablet (10 mg total) by mouth daily., Disp: 90 tablet, Rfl: 3   hydrocortisone  2.5 % cream, Apply T, TH, S at bedtime to rash at abdomen., Disp: 30 g, Rfl: 11   ketoconazole  (NIZORAL ) 2 % cream, Apply M, W, F at bedtime to rash at abdomen., Disp: 30 g, Rfl: 11   lisinopril  (ZESTRIL ) 30 MG tablet, Take 1 tablet (30 mg total) by mouth daily., Disp: 90 tablet, Rfl: 3   pimecrolimus  (ELIDEL ) 1 % cream, APPLY   TOPICALLY TO AFFECTED AREA IN GROIN TWICE DAILY, Disp: 60 g, Rfl: 11   apalutamide  (ERLEADA ) 60 MG tablet, Take 4 tablets (240 mg total) by mouth daily., Disp: 120 tablet, Rfl: 3  Physical exam:  Vitals:   11/05/23 1420  BP: 138/74  Pulse: 66  Resp: 17  Temp: 98.6 F (37 C)  SpO2: 97%  Weight: 245 lb (111.1 kg)   Physical Exam Cardiovascular:     Rate and Rhythm: Normal  rate and regular rhythm.     Heart sounds: Normal heart sounds.  Pulmonary:     Effort: Pulmonary effort is normal.     Breath sounds: Normal breath sounds.  Abdominal:     General: Bowel sounds are normal.     Palpations: Abdomen is soft.  Musculoskeletal:     Right lower leg: Edema present.     Left lower leg: Edema present.  Skin:    General: Skin is warm and dry.  Neurological:     Mental Status: He is alert and oriented to person, place, and time.      I have personally reviewed labs listed below:    Latest Ref Rng & Units 11/05/2023    1:38 PM  CMP  Glucose 70 - 99 mg/dL 852   BUN 8 - 23 mg/dL 21   Creatinine 9.38 - 1.24 mg/dL 9.00   Sodium 864 - 854 mmol/L 138   Potassium 3.5 - 5.1 mmol/L 3.9   Chloride 98 - 111 mmol/L 105   CO2 22 - 32 mmol/L 25   Calcium  8.9 - 10.3 mg/dL 9.2   Total Protein 6.5 - 8.1 g/dL 6.4   Total Bilirubin 0.0 - 1.2 mg/dL 0.2   Alkaline Phos 38 - 126 U/L 65   AST 15 - 41 U/L 17   ALT 0 - 44 U/L 12       Latest Ref Rng & Units 11/05/2023    1:38 PM  CBC  WBC 4.0 - 10.5 K/uL 8.1   Hemoglobin 13.0 - 17.0 g/dL 87.3   Hematocrit 60.9 - 52.0 % 38.1   Platelets 150 - 400 K/uL 220    Assessment and plan- Patient is a 78 y.o. male with castrate resistant non metastatic prostate cancer on eligard  and apalutamide  here for routine follow up  PSA remains between undetectable and 0.01. he gets eligard  today. Continue apalutamide  until progression or toxicity. I will see him in 6 months with cbc with diff cmp and PSA for eligard    Visit Diagnosis 1. Malignant neoplasm of prostate (HCC)   2. High risk medication use   3.  Encounter for monitoring Lupron  therapy      Dr. Annah Skene, MD, MPH [facility] [phone] 11/07/2023 4:03 PM

## 2023-11-08 ENCOUNTER — Other Ambulatory Visit: Payer: Self-pay

## 2023-11-11 ENCOUNTER — Other Ambulatory Visit: Payer: Self-pay | Admitting: Pharmacy Technician

## 2023-11-11 ENCOUNTER — Other Ambulatory Visit: Payer: Self-pay

## 2023-11-11 NOTE — Progress Notes (Signed)
 Specialty Pharmacy Ongoing Clinical Assessment Note  Eric Stone. is a 78 y.o. male who is being followed by the specialty pharmacy service for RxSp Oncology   Patient's specialty medication(s) reviewed today: Apalutamide  (ERLEADA )   Missed doses in the last 4 weeks: 0   Patient/Caregiver did not have any additional questions or concerns.   Therapeutic benefit summary: Patient is achieving benefit   Adverse events/side effects summary: Experienced adverse events/side effects (hot flashes, tolerable)   Patient's therapy is appropriate to: Continue    Goals Addressed             This Visit's Progress    Slow Disease Progression   On track    Patient is on track. Patient will maintain adherence.  PSA was 0.01 ng/mL on 11/05/23.          Follow up: 6 months  [name] Specialty Pharmacist

## 2023-11-11 NOTE — Progress Notes (Signed)
 Specialty Pharmacy Refill Coordination Note  Eric Stone. is a 78 y.o. male contacted today regarding refills of specialty medication(s) Apalutamide  (ERLEADA )  Spoke with Wife  Patient requested Delivery   Delivery date: 11/15/23   Verified address: [address]  [place]   Medication will be filled on 11/14/23.

## 2023-11-13 ENCOUNTER — Other Ambulatory Visit: Payer: Self-pay

## 2023-12-10 ENCOUNTER — Other Ambulatory Visit: Payer: Self-pay

## 2023-12-12 ENCOUNTER — Other Ambulatory Visit: Payer: Self-pay

## 2023-12-31 ENCOUNTER — Other Ambulatory Visit

## 2023-12-31 DIAGNOSIS — E785 Hyperlipidemia, unspecified: Secondary | ICD-10-CM | POA: Diagnosis not present

## 2023-12-31 DIAGNOSIS — E1169 Type 2 diabetes mellitus with other specified complication: Secondary | ICD-10-CM | POA: Diagnosis not present

## 2023-12-31 DIAGNOSIS — Z0000 Encounter for general adult medical examination without abnormal findings: Secondary | ICD-10-CM

## 2023-12-31 DIAGNOSIS — I10 Essential (primary) hypertension: Secondary | ICD-10-CM | POA: Diagnosis not present

## 2023-12-31 DIAGNOSIS — Z79899 Other long term (current) drug therapy: Secondary | ICD-10-CM

## 2024-01-01 LAB — COMPREHENSIVE METABOLIC PANEL WITH GFR
AG Ratio: 2.3 (calc) (ref 1.0–2.5)
ALT: 11 U/L (ref 9–46)
AST: 12 U/L (ref 10–35)
Albumin: 4.5 g/dL (ref 3.6–5.1)
Alkaline phosphatase (APISO): 66 U/L (ref 35–144)
BUN: 20 mg/dL (ref 7–25)
CO2: 25 mmol/L (ref 20–32)
Calcium: 9.3 mg/dL (ref 8.6–10.3)
Chloride: 106 mmol/L (ref 98–110)
Creat: 1.11 mg/dL (ref 0.70–1.28)
Globulin: 2 g/dL (ref 1.9–3.7)
Glucose, Bld: 140 mg/dL — ABNORMAL HIGH (ref 65–99)
Potassium: 4.4 mmol/L (ref 3.5–5.3)
Sodium: 141 mmol/L (ref 135–146)
Total Bilirubin: 0.4 mg/dL (ref 0.2–1.2)
Total Protein: 6.5 g/dL (ref 6.1–8.1)
eGFR: 69 mL/min/1.73m2 (ref >=60)

## 2024-01-01 LAB — CBC WITH DIFFERENTIAL/PLATELET
Absolute Lymphocytes: 1559 {cells}/uL (ref 850–3900)
Absolute Monocytes: 531 {cells}/uL (ref 200–950)
Basophils Absolute: 48 {cells}/uL (ref 0–200)
Basophils Relative: 0.7 %
Eosinophils Absolute: 352 {cells}/uL (ref 15–500)
Eosinophils Relative: 5.1 %
HCT: 38.7 % (ref 38.5–50.0)
Hemoglobin: 13.2 g/dL (ref 13.2–17.1)
MCH: 31 pg (ref 27.0–33.0)
MCHC: 34.1 g/dL (ref 32.0–36.0)
MCV: 90.8 fL (ref 80.0–100.0)
MPV: 10.5 fL (ref 7.5–12.5)
Monocytes Relative: 7.7 %
Neutro Abs: 4409 {cells}/uL (ref 1500–7800)
Neutrophils Relative %: 63.9 %
Platelets: 222 Thousand/uL (ref 140–400)
RBC: 4.26 Million/uL (ref 4.20–5.80)
RDW: 12.9 % (ref 11.0–15.0)
Total Lymphocyte: 22.6 %
WBC: 6.9 Thousand/uL (ref 3.8–10.8)

## 2024-01-01 LAB — TSH: TSH: 1.32 m[IU]/L (ref 0.40–4.50)

## 2024-01-01 LAB — HEMOGLOBIN A1C
Hgb A1c MFr Bld: 6.8 % — ABNORMAL HIGH (ref <5.7)
Mean Plasma Glucose: 148 mg/dL
eAG (mmol/L): 8.2 mmol/L

## 2024-01-01 LAB — MICROALBUMIN / CREATININE URINE RATIO
Creatinine, Urine: 129 mg/dL (ref 20–320)
Microalb Creat Ratio: 5 mg/g{creat} (ref <30)
Microalb, Ur: 0.6 mg/dL

## 2024-01-01 LAB — LIPID PANEL
Cholesterol: 126 mg/dL (ref <200)
HDL: 51 mg/dL (ref >=40)
LDL Cholesterol (Calc): 58 mg/dL
Non-HDL Cholesterol (Calc): 75 mg/dL (ref <130)
Total CHOL/HDL Ratio: 2.5 (calc) (ref <5.0)
Triglycerides: 83 mg/dL (ref <150)

## 2024-01-08 ENCOUNTER — Encounter: Payer: Self-pay | Admitting: Family Medicine

## 2024-01-08 ENCOUNTER — Ambulatory Visit: Admitting: Family Medicine

## 2024-01-08 VITALS — BP 142/78 | HR 83 | Ht 70.0 in | Wt 245.4 lb

## 2024-01-08 DIAGNOSIS — Z79899 Other long term (current) drug therapy: Secondary | ICD-10-CM

## 2024-01-08 DIAGNOSIS — Z89412 Acquired absence of left great toe: Secondary | ICD-10-CM | POA: Diagnosis not present

## 2024-01-08 DIAGNOSIS — Z23 Encounter for immunization: Secondary | ICD-10-CM

## 2024-01-08 DIAGNOSIS — E1169 Type 2 diabetes mellitus with other specified complication: Secondary | ICD-10-CM | POA: Diagnosis not present

## 2024-01-08 DIAGNOSIS — Z0000 Encounter for general adult medical examination without abnormal findings: Secondary | ICD-10-CM

## 2024-01-08 DIAGNOSIS — G4733 Obstructive sleep apnea (adult) (pediatric): Secondary | ICD-10-CM | POA: Diagnosis not present

## 2024-01-08 DIAGNOSIS — I10 Essential (primary) hypertension: Secondary | ICD-10-CM

## 2024-01-08 DIAGNOSIS — E785 Hyperlipidemia, unspecified: Secondary | ICD-10-CM | POA: Diagnosis not present

## 2024-01-08 LAB — OPHTHALMOLOGY REPORT-SCANNED

## 2024-01-08 MED ORDER — LISINOPRIL 30 MG PO TABS: 30.0000 mg | ORAL_TABLET | Freq: Every day | ORAL | 3 refills | Status: AC

## 2024-01-08 NOTE — Progress Notes (Signed)
 Subjective:    Patient ID: Eric DELENA Erasmo Mickey., male    DOB: 1945/04/16, 78 y.o.   MRN: [id number]  Eric Brandel. is a 78 y.o. male presenting on 01/08/2024 for Annual Exam   HPI  Discussed the use of AI scribe software for clinical note transcription with the patient, who gave verbal consent to proceed.  History of Present Illness   Eric Hynek. is a 78 year old male who presents for an annual physical exam.    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 No longer on hydrochlorothiazide  Controlled. Not checking regularly now. Current Meds - Lisinopril  30mg  daily Reports good compliance, took meds today. Tolerating well, w/o complaints. - Admits mild varicose veins edema, but improved on compression stocking - stable after off thiazide Denies CP, dyspnea, HA, dizziness / lightheadedness   Type 2 Diabetes A1c stable 6.8 from 6.6 Meds: never on meds Currently on ACEi Lifestyle: Weight stable - Diet (Improved diet, reduce portions) - Exercise (Improved activity and exercise) - S/p L Great Toe Amputation Completed Diabetic Eye Exam 01/14/23 Dr Launie in [place] Denies hypoglycemia, polyuria, visual changes, numbness or tingling.   HYPERLIPIDEMIA: Last lab LDL 58, controlled - Currently taking Atorvastatin  10mg , tolerating well without side effects or myalgias   OSA, on CPAP - Patient reports prior history of dx OSA and on CPAP for >20 years, prior to treatment his initial symptoms were snoring, daytime sleepiness and fatigue, he has had several sleep studies in the past. Previously dx mild to moderate OSA. - He reports that his sleep apnea is well controlled. He uses the CPAP machine every night. He  tolerates the machine well, and thinks that he sleeps better with it and feels good. No new concerns or symptoms.      PMH - Melanoma, Left big toe / History of Skin Cancer S/p amputation - followed by [facility]   PMH Chronic Low Back Pain, with osteoarthritis Has seen Thom Geralds PA [facility], had x-rays, copied below of lumbar spine, has gradually improved, advanced OA/DJD, no other intervention. He can return if worsening.   S/p prostate cancer Followed by Dr Melanee and Dr Lenn for Prostate Cancer Last report updated 04/2023 with PSA still undetectable < 0.01 on therapy. He continues on Lupron  injection plus apalutamide  PSA trend every 3 months Apalutamide  side effect to raise blood pressure   Health Maintenance: Prevnar-20 vaccine due today    Colon CA screening - Last cologuard done 01/31/23 negative. Consider repeat 3 years 2027  Flu Shot     01/08/2024    9:08 AM 06/27/2023    8:40 AM 01/21/2023    9:10 AM  Depression screen PHQ 2/9  Decreased Interest 0 0 0  Down, Depressed, Hopeless 0 0 0  PHQ - 2 Score 0 0 0  Altered sleeping 0 0 0  Tired, decreased energy 1 2 0  Change in appetite 0 0 0  Feeling bad or failure about yourself  0 0 0  Trouble concentrating 0 0 0  Moving slowly or fidgety/restless 0 0 0  Suicidal thoughts 0 0 0  PHQ-9 Score 1 2 0  Difficult doing work/chores Not difficult at all Somewhat difficult Not difficult  at all       01/08/2024    9:08 AM 06/27/2023    8:40 AM 01/21/2023    9:10 AM 10/24/2022   11:56 AM  GAD 7 : Generalized Anxiety Score  Nervous, Anxious, on Edge 0 0 0 0  Control/stop worrying 0 0 0 0  Worry too much - different things 0 0 0 0  Trouble relaxing 0 0 0 0  Restless 0 0 0 0  Easily annoyed or irritable 0 0 0 0  Afraid - awful might happen 0 0 0 0  Total GAD 7 Score 0 0 0 0  Anxiety Difficulty  Not difficult at all  Not difficult at all     Past Medical History:  Diagnosis Date   Actinic keratosis     Arthritis    H/O blood clots    H/O blood clots    in left leg in 1988   Hx of dysplastic nevus 06/18/2013   L lat ankle - mild   Melanoma (HCC) 02/21/2016   L prox med plantar great toe - Breslow's 1.34mm, Clark level early 4, excised 03/16/2016 at [facility]   Prostate cancer [facility])    Rash    occasionally and pt does see a dermatologist   Sleep apnea    uses a CPAP;sleep study >11yrs ago   Past Surgical History:  Procedure Laterality Date   TOE SURGERY     melanoma   TOTAL KNEE ARTHROPLASTY  2009-2010   bilateral   VIDEO ASSISTED THORACOSCOPY (VATS)/DECORTICATION Left 05/06/2012   Procedure: VIDEO ASSISTED THORACOSCOPY (VATS) drainage loculated pleural effusion;  Surgeon: Dallas KATHEE Jude, MD;  Location: [facility] OR;  Service: Thoracic;  Laterality: Left;   VIDEO BRONCHOSCOPY N/A 05/06/2012   Procedure: VIDEO BRONCHOSCOPY;  Surgeon: Dallas KATHEE Jude, MD;  Location: [facility] OR;  Service: Thoracic;  Laterality: N/A;   Social History   Socioeconomic History   Marital status: Married    Spouse name: Not on file   Number of children: Not on file   Years of education: tech school   Highest education level: High school graduate  Occupational History   Occupation: retired  Tobacco Use   Smoking status: Former    Current packs/day: 0.00    Average packs/day: 1 pack/day for 26.0 years (26.0 ttl pk-yrs)    Types: Cigarettes    Start date: 03/27/1963    Quit date: 03/26/1989    Years since quitting: 34.8   Smokeless tobacco: Former   Tobacco comments:    less than 1 PPD  Vaping Use   Vaping status: Never Used  Substance and Sexual Activity   Alcohol use: No    Alcohol/week: 0.0 standard drinks of alcohol   Drug use: No   Sexual activity: Yes  Other Topics Concern   Not on file  Social History Narrative   Not on file   Social Drivers of Health   Financial Resource Strain: Low Risk  (01/07/2018)   Overall Financial Resource Strain (CARDIA)    Difficulty of Paying Living Expenses: Not hard  at all  Food Insecurity: No Food Insecurity (01/07/2018)   Hunger Vital Sign    Worried About Running Out of Food in the Last Year: Never true    Ran Out of Food in the Last Year: Never true  Transportation Needs: No Transportation Needs (01/07/2018)   PRAPARE - Administrator, Civil Service (Medical): No    Lack of Transportation (Non-Medical): No  Physical Activity: Inactive (01/07/2018)  Exercise Vital Sign    Days of Exercise per Week: 0 days    Minutes of Exercise per Session: 0 min  Stress: No Stress Concern Present (01/07/2018)   [employer] Occup[employer] - Occupational Stress Questionnaire    Feeling of Stress : Not at all  Social Connections: Moderately Integrated (01/07/2018)   Social Connection and Isolation Panel    Frequency of Communication with Friends and Family: More than three times a week    Frequency of Social Gatherings with Friends and Family: More than three times a week    Attends Religious Services: More than 4 times per year    Active Member of [employer] or Organizations: No    Attends Banker Meetings: Never    Marital Status: Married  Catering manager Violence: Not At Risk (01/07/2018)   Humiliation, Afraid, Rape, and Kick questionnaire    Fear of Current or Ex-Partner: No    Emotionally Abused: No    Physically Abused: No    Sexually Abused: No   Family History  Problem Relation Age of Onset   Hypertension Mother    Stroke Mother        possible   Cancer Brother    Heart attack Brother    Heart disease Brother        stent placement   Current Outpatient Medications on File Prior to Visit  Medication Sig   apalutamide  (ERLEADA ) 60 MG tablet Take 4 tablets (240 mg total) by mouth daily.   aspirin  EC 81 MG tablet Take 81 mg by mouth.   atorvastatin  (LIPITOR) 20 MG tablet Take 1 tablet (20 mg total) by mouth at bedtime.   ezetimibe  (ZETIA ) 10 MG tablet Take 1 tablet (10 mg total) by mouth daily.    hydrocortisone  2.5 % cream Apply T, TH, S at bedtime to rash at abdomen.   ketoconazole  (NIZORAL ) 2 % cream Apply M, W, F at bedtime to rash at abdomen.   pimecrolimus  (ELIDEL ) 1 % cream APPLY   TOPICALLY TO AFFECTED AREA IN GROIN TWICE DAILY   No current facility-administered medications on file prior to visit.    Review of Systems  Constitutional:  Negative for activity change, appetite change, chills, diaphoresis, fatigue and fever.  HENT:  Negative for congestion and hearing loss.   Eyes:  Negative for visual disturbance.  Respiratory:  Negative for cough, chest tightness, shortness of breath and wheezing.   Cardiovascular:  Negative for chest pain, palpitations and leg swelling.  Gastrointestinal:  Negative for abdominal pain, constipation, diarrhea, nausea and vomiting.  Genitourinary:  Negative for dysuria, frequency and hematuria.  Musculoskeletal:  Negative for arthralgias and neck pain.  Skin:  Negative for rash.  Neurological:  Negative for dizziness, weakness, light-headedness, numbness and headaches.  Hematological:  Negative for adenopathy.  Psychiatric/Behavioral:  Negative for behavioral problems, dysphoric mood and sleep disturbance.    Per HPI unless specifically indicated above     Objective:    BP (!) 142/78 (BP Location: Left Arm, Cuff Size: Normal)   Pulse 83   Ht 5' 10 (1.778 m)   Wt 245 lb 6 oz (111.3 kg)   SpO2 97%   BMI 35.21 kg/m   Wt Readings from Last 3 Encounters:  01/08/24 245 lb 6 oz (111.3 kg)  11/05/23 245 lb (111.1 kg)  07/29/23 241 lb 3.2 oz (109.4 kg)    Physical Exam Vitals and nursing note reviewed.  Constitutional:      General: He is not  in acute distress.    Appearance: He is well-developed. He is not diaphoretic.     Comments: Well-appearing, comfortable, cooperative  HENT:     Head: Normocephalic and atraumatic.  Eyes:     General:        Right eye: No discharge.        Left eye: No discharge.     Conjunctiva/sclera:  Conjunctivae normal.     Pupils: Pupils are equal, round, and reactive to light.  Neck:     Thyroid : No thyromegaly.     Vascular: No carotid bruit.  Cardiovascular:     Rate and Rhythm: Normal rate and regular rhythm.     Pulses: Normal pulses.     Heart sounds: Normal heart sounds. No murmur heard. Pulmonary:     Effort: Pulmonary effort is normal. No respiratory distress.     Breath sounds: Normal breath sounds. No wheezing or rales.  Abdominal:     General: Bowel sounds are normal. There is no distension.     Palpations: Abdomen is soft. There is no mass.     Tenderness: There is no abdominal tenderness.  Musculoskeletal:        General: No tenderness. Normal range of motion.     Cervical back: Normal range of motion and neck supple.     Right lower leg: Edema (+2 edema bilateral lower extremities) present.     Left lower leg: Edema present.     Comments: Upper / Lower Extremities: - Normal muscle tone, strength bilateral upper extremities 5/5, lower extremities 5/5  Lymphadenopathy:     Cervical: No cervical adenopathy.  Skin:    General: Skin is warm and dry.     Findings: No erythema or rash.  Neurological:     Mental Status: He is alert and oriented to person, place, and time.     Comments: Distal sensation intact to light touch all extremities  Psychiatric:        Mood and Affect: Mood normal.        Behavior: Behavior normal.        Thought Content: Thought content normal.     Comments: Well groomed, good eye contact, normal speech and thoughts     Diabetic Foot Exam - Simple   Simple Foot Form Diabetic Foot exam was performed with the following findings: Yes 01/08/2024  9:51 AM  Visual Inspection See comments: Yes Sensation Testing Intact to touch and monofilament testing bilaterally: Yes Pulse Check Posterior Tibialis and Dorsalis pulse intact bilaterally: Yes Comments Bilateral varicose veins and some edema localized. Left foot s/p great toe amputation  well healed. No other obvious deformity. Intact sensation to monofilament.      Results for orders placed or performed in visit on 12/31/23  Comprehensive metabolic panel with GFR   Collection Time: 12/31/23  8:09 AM  Result Value Ref Range   Glucose, Bld 140 (H) 65 - 99 mg/dL   BUN 20 7 - 25 mg/dL   Creat 8.88 9.29 - 8.71 mg/dL   eGFR 69 > OR = 60 fO/fpw/8.26f7   BUN/Creatinine Ratio SEE NOTE: 6 - 22 (calc)   Sodium 141 135 - 146 mmol/L   Potassium 4.4 3.5 - 5.3 mmol/L   Chloride 106 98 - 110 mmol/L   CO2 25 20 - 32 mmol/L   Calcium  9.3 8.6 - 10.3 mg/dL   Total Protein 6.5 6.1 - 8.1 g/dL   Albumin 4.5 3.6 - 5.1 g/dL   Globulin 2.0 1.9 -  3.7 g/dL (calc)   AG Ratio 2.3 1.0 - 2.5 (calc)   Total Bilirubin 0.4 0.2 - 1.2 mg/dL   Alkaline phosphatase (APISO) 66 35 - 144 U/L   AST 12 10 - 35 U/L   ALT 11 9 - 46 U/L  TSH   Collection Time: 12/31/23  8:09 AM  Result Value Ref Range   TSH 1.32 0.40 - 4.50 mIU/L  Microalbumin / creatinine urine ratio   Collection Time: 12/31/23  8:09 AM  Result Value Ref Range   Creatinine, Urine 129 20 - 320 mg/dL   Microalb, Ur 0.6 mg/dL   Microalb Creat Ratio 5 <30 mg/g creat  CBC with Differential/Platelet   Collection Time: 12/31/23  8:09 AM  Result Value Ref Range   WBC 6.9 3.8 - 10.8 Thousand/uL   RBC 4.26 4.20 - 5.80 Million/uL   Hemoglobin 13.2 13.2 - 17.1 g/dL   HCT 61.2 61.4 - 49.9 %   MCV 90.8 80.0 - 100.0 fL   MCH 31.0 27.0 - 33.0 pg   MCHC 34.1 32.0 - 36.0 g/dL   RDW 87.0 88.9 - 84.9 %   Platelets 222 140 - 400 Thousand/uL   MPV 10.5 7.5 - 12.5 fL   Neutro Abs 4,409 1,500 - 7,800 cells/uL   Absolute Lymphocytes 1,559 850 - 3,900 cells/uL   Absolute Monocytes 531 200 - 950 cells/uL   Eosinophils Absolute 352 15 - 500 cells/uL   Basophils Absolute 48 0 - 200 cells/uL   Neutrophils Relative % 63.9 %   Total Lymphocyte 22.6 %   Monocytes Relative 7.7 %   Eosinophils Relative 5.1 %   Basophils Relative 0.7 %  Hemoglobin A1c    Collection Time: 12/31/23  8:09 AM  Result Value Ref Range   Hgb A1c MFr Bld 6.8 (H) <5.7 %   Mean Plasma Glucose 148 mg/dL   eAG (mmol/L) 8.2 mmol/L  Lipid panel   Collection Time: 12/31/23  8:09 AM  Result Value Ref Range   Cholesterol 126 <200 mg/dL   HDL 51 > OR = 40 mg/dL   Triglycerides 83 <849 mg/dL   LDL Cholesterol (Calc) 58 mg/dL (calc)   Total CHOL/HDL Ratio 2.5 <5.0 (calc)   Non-HDL Cholesterol (Calc) 75 <869 mg/dL (calc)      Assessment & Plan:   Problem List Items Addressed This Visit     Essential hypertension   Relevant Medications   lisinopril  (ZESTRIL ) 30 MG tablet   History of amputation of left great toe   Hyperlipidemia associated with type 2 diabetes mellitus (HCC)   Relevant Medications   lisinopril  (ZESTRIL ) 30 MG tablet   OSA on CPAP   Type 2 diabetes mellitus with other specified complication (HCC)   Relevant Medications   lisinopril  (ZESTRIL ) 30 MG tablet   Other Visit Diagnoses       Annual physical exam    -  Primary     Needs flu shot       Relevant Orders   Flu vaccine HIGH DOSE PF(Fluzone Trivalent) (Completed)     High risk medication use            Updated Health Maintenance information Reviewed recent lab results with patient Encouraged improvement to lifestyle with diet and exercise Goal of weight loss   Adult Wellness Visit Overall health stable with satisfactory labs, good blood sugar and cholesterol control. - Administer flu shot. - Continue current management of chronic conditions.  Prostate cancer On apalutamide , which may elevate blood  pressure. PSA monitored biannually, next in April. - Continue apalutamide  therapy. - Schedule PSA check in April.  Essential hypertension BP 152/76 mmHg, slightly elevated. Apalutamide  may exacerbate. Current lisinopril  management adequate, future dose adjustment possible. - Refill lisinopril  30mg  prescription. Consider dose adjust to 40mg  in future - Monitor BP at home, report  if >150/90 mmHg.  Type 2 diabetes mellitus Blood sugar well controlled, HbA1c 6.8%.  Hyperlipidemia Cholesterol improved with atorvastatin , LDL 58 mg/dL.  Chronic lower extremity edema and varicose veins Chronic edema and varicose veins, left foot affected. Edema reduces with elevation. - Elevate legs as needed.  Obstructive sleep apnea Controlled on CPAP, well-tolerated.       Orders Placed This Encounter  Procedures   Flu vaccine HIGH DOSE PF(Fluzone Trivalent)    Meds ordered this encounter  Medications   lisinopril  (ZESTRIL ) 30 MG tablet    Sig: Take 1 tablet (30 mg total) by mouth daily.    Dispense:  90 tablet    Refill:  3     Follow up plan: Return in about 6 months (around 07/08/2024) for 6 month DM A1c.  Marsa Officer, DO [facility] 01/08/2024, 9:49 AM

## 2024-01-08 NOTE — Patient Instructions (Addendum)
 Thank you for coming to the office today.  Flu Shot today  Keep checking BP readings Goal < 150 / 90  Refilled Lisinopril  30mg , we can adjust dose up to 40mg   Cholesterol looks very good on the medication  Recent Labs    01/16/23 0900 06/27/23 0846 12/31/23 0809  HGBA1C 7.0* 6.6* 6.8*    Please schedule a Follow-up Appointment to: Return in about 6 months (around 07/08/2024) for 6 month DM A1c.  If you have any other questions or concerns, please feel free to call the office or send a message through MyChart. You may also schedule an earlier appointment if necessary.  Additionally, you may be receiving a survey about your experience at our office within a few days to 1 week by e-mail or mail. We value your feedback.  Marsa Officer, DO [facility], [place]

## 2024-01-20 ENCOUNTER — Other Ambulatory Visit: Payer: Self-pay

## 2024-01-20 NOTE — Progress Notes (Signed)
 Specialty Pharmacy Refill Coordination Note  Eric Stone. is a 78 y.o. male contacted today regarding refills of specialty medication(s) Apalutamide  (ERLEADA )   Patient requested Delivery   Delivery date: 01/21/24   Verified address: [address]  [place]   Medication will be filled on: 01/20/24

## 2024-01-28 DIAGNOSIS — H2513 Age-related nuclear cataract, bilateral: Secondary | ICD-10-CM | POA: Diagnosis not present

## 2024-02-10 ENCOUNTER — Other Ambulatory Visit: Payer: Self-pay

## 2024-02-13 ENCOUNTER — Other Ambulatory Visit (HOSPITAL_COMMUNITY): Payer: Self-pay

## 2024-02-17 ENCOUNTER — Other Ambulatory Visit (HOSPITAL_COMMUNITY): Payer: Self-pay

## 2024-02-17 DIAGNOSIS — E66812 Obesity, class 2: Secondary | ICD-10-CM | POA: Diagnosis not present

## 2024-02-17 DIAGNOSIS — Z87891 Personal history of nicotine dependence: Secondary | ICD-10-CM | POA: Diagnosis not present

## 2024-02-17 DIAGNOSIS — Z86718 Personal history of other venous thrombosis and embolism: Secondary | ICD-10-CM | POA: Diagnosis not present

## 2024-02-17 DIAGNOSIS — Z7982 Long term (current) use of aspirin: Secondary | ICD-10-CM | POA: Diagnosis not present

## 2024-02-17 DIAGNOSIS — E1136 Type 2 diabetes mellitus with diabetic cataract: Secondary | ICD-10-CM | POA: Diagnosis not present

## 2024-02-17 DIAGNOSIS — H2511 Age-related nuclear cataract, right eye: Secondary | ICD-10-CM | POA: Diagnosis not present

## 2024-02-17 DIAGNOSIS — Z6835 Body mass index (BMI) 35.0-35.9, adult: Secondary | ICD-10-CM | POA: Diagnosis not present

## 2024-02-17 DIAGNOSIS — Z79899 Other long term (current) drug therapy: Secondary | ICD-10-CM | POA: Diagnosis not present

## 2024-02-17 DIAGNOSIS — G4733 Obstructive sleep apnea (adult) (pediatric): Secondary | ICD-10-CM | POA: Diagnosis not present

## 2024-02-17 DIAGNOSIS — I10 Essential (primary) hypertension: Secondary | ICD-10-CM | POA: Diagnosis not present

## 2024-02-18 ENCOUNTER — Other Ambulatory Visit: Payer: Self-pay

## 2024-02-18 NOTE — Progress Notes (Signed)
 Specialty Pharmacy Refill Coordination Note  Eric Stone. is a 78 y.o. male contacted today regarding refills of specialty medication(s) Apalutamide  (ERLEADA )   Patient requested Delivery   Delivery date: 02/19/24   Verified address: [address]  [place]   Medication will be filled on: 02/18/24

## 2024-02-24 DIAGNOSIS — Z79899 Other long term (current) drug therapy: Secondary | ICD-10-CM | POA: Diagnosis not present

## 2024-02-24 DIAGNOSIS — Z7982 Long term (current) use of aspirin: Secondary | ICD-10-CM | POA: Diagnosis not present

## 2024-02-24 DIAGNOSIS — I10 Essential (primary) hypertension: Secondary | ICD-10-CM | POA: Diagnosis not present

## 2024-02-24 DIAGNOSIS — Z961 Presence of intraocular lens: Secondary | ICD-10-CM | POA: Diagnosis not present

## 2024-02-24 DIAGNOSIS — G4733 Obstructive sleep apnea (adult) (pediatric): Secondary | ICD-10-CM | POA: Diagnosis not present

## 2024-02-24 DIAGNOSIS — Z6834 Body mass index (BMI) 34.0-34.9, adult: Secondary | ICD-10-CM | POA: Diagnosis not present

## 2024-02-24 DIAGNOSIS — Z9841 Cataract extraction status, right eye: Secondary | ICD-10-CM | POA: Diagnosis not present

## 2024-02-24 DIAGNOSIS — H2512 Age-related nuclear cataract, left eye: Secondary | ICD-10-CM | POA: Diagnosis not present

## 2024-02-24 DIAGNOSIS — Z86718 Personal history of other venous thrombosis and embolism: Secondary | ICD-10-CM | POA: Diagnosis not present

## 2024-02-24 DIAGNOSIS — E1136 Type 2 diabetes mellitus with diabetic cataract: Secondary | ICD-10-CM | POA: Diagnosis not present

## 2024-02-24 DIAGNOSIS — Z87891 Personal history of nicotine dependence: Secondary | ICD-10-CM | POA: Diagnosis not present

## 2024-02-24 DIAGNOSIS — E66811 Obesity, class 1: Secondary | ICD-10-CM | POA: Diagnosis not present

## 2024-03-10 ENCOUNTER — Other Ambulatory Visit: Payer: Self-pay

## 2024-03-12 ENCOUNTER — Other Ambulatory Visit: Payer: Self-pay | Admitting: Pharmacy Technician

## 2024-03-12 ENCOUNTER — Other Ambulatory Visit: Payer: Self-pay

## 2024-03-12 NOTE — Progress Notes (Signed)
 Specialty Pharmacy Refill Coordination Note  Eric Stone. is a 78 y.o. male contacted today regarding refills of specialty medication(s) Apalutamide  (ERLEADA )   Patient requested Delivery   Delivery date: 03/13/24   Verified address: [address]  [place]   Medication will be filled on: 03/12/24

## 2024-03-27 ENCOUNTER — Other Ambulatory Visit: Payer: Self-pay

## 2024-03-27 ENCOUNTER — Other Ambulatory Visit: Payer: Self-pay | Admitting: Pharmacist

## 2024-03-27 DIAGNOSIS — C61 Malignant neoplasm of prostate: Secondary | ICD-10-CM

## 2024-03-27 MED ORDER — APALUTAMIDE 60 MG PO TABS
240.0000 mg | ORAL_TABLET | Freq: Every day | ORAL | 3 refills | Status: AC
  Filled 2024-03-27 – 2024-04-16 (×5): qty 120, 30d supply, fill #0

## 2024-04-01 ENCOUNTER — Other Ambulatory Visit: Payer: Self-pay

## 2024-04-02 ENCOUNTER — Encounter: Payer: Self-pay | Admitting: Family Medicine

## 2024-04-02 ENCOUNTER — Ambulatory Visit (INDEPENDENT_AMBULATORY_CARE_PROVIDER_SITE_OTHER): Admitting: Family Medicine

## 2024-04-02 VITALS — BP 132/80 | HR 73 | Temp 98.3°F | Ht 70.0 in | Wt 246.5 lb

## 2024-04-02 DIAGNOSIS — R051 Acute cough: Secondary | ICD-10-CM

## 2024-04-02 DIAGNOSIS — J209 Acute bronchitis, unspecified: Secondary | ICD-10-CM

## 2024-04-02 DIAGNOSIS — R6889 Other general symptoms and signs: Secondary | ICD-10-CM | POA: Diagnosis not present

## 2024-04-02 LAB — POCT INFLUENZA A/B
Influenza A, POC: NEGATIVE
Influenza B, POC: NEGATIVE

## 2024-04-02 MED ORDER — GUAIFENESIN-CODEINE 100-10 MG/5ML PO SOLN: 5.0000 mL | Freq: Three times a day (TID) | ORAL | 0 refills | Status: AC | PRN

## 2024-04-02 MED ORDER — AZITHROMYCIN 250 MG PO TABS: ORAL_TABLET | ORAL | 0 refills | Status: AC

## 2024-04-02 MED ORDER — ALBUTEROL SULFATE HFA 108 (90 BASE) MCG/ACT IN AERS: 2.0000 | INHALATION_SPRAY | Freq: Four times a day (QID) | RESPIRATORY_TRACT | 0 refills | Status: AC | PRN

## 2024-04-02 MED ORDER — BENZONATATE 100 MG PO CAPS: 100.0000 mg | ORAL_CAPSULE | Freq: Three times a day (TID) | ORAL | 0 refills | Status: AC | PRN

## 2024-04-02 MED ORDER — IPRATROPIUM BROMIDE 0.06 % NA SOLN: 2.0000 | Freq: Four times a day (QID) | NASAL | 0 refills | Status: AC

## 2024-04-02 NOTE — Patient Instructions (Addendum)
 Thank you for coming to the office today.  Flu test negative  Likely viral respiratory infection  Start Atrovent  nasal spray decongestant 2 sprays in each nostril up to 4 times daily for 7 days  Start Tessalon  Perls take 1 capsule up to 3 times a day as needed for cough  Codeine  cough syrup at night  - Use Albuterol  inhaler 2 puffs every 4-6 hours around the clock for next 2-3 days, max up to 5 days then use as needed   - IF not improving after 24-48 hours, or develop worsening fever, productive cough then start Azithromycin  Z pak (antibiotic) 2 tabs day 1, then 1 tab x 4 days, complete entire course even if improved  If need we can consider chest x-ray or other testing / treatment  Please schedule a Follow-up Appointment to: Return if symptoms worsen or fail to improve.  If you have any other questions or concerns, please feel free to call the office or send a message through MyChart. You may also schedule an earlier appointment if necessary.  Additionally, you may be receiving a survey about your experience at our office within a few days to 1 week by e-mail or mail. We value your feedback.  Marsa Officer, DO [facility], [place]

## 2024-04-02 NOTE — Progress Notes (Signed)
 "  Subjective:    Patient ID: Eric Stone., male    DOB: November 26, 1945, 79 y.o.   MRN: [id number]  Eric Stone. is a 79 y.o. male presenting on 04/02/2024 for Cough (Runny nose, no fever couple of days)  Patient presents for a same day appointment.   HPI  Discussed the use of AI scribe software for clinical note transcription with the patient, who gave verbal consent to proceed.  History of Present Illness   Eric Stone. is a 79 year old male who presents with cough, runny nose, and congestion.  Acute Bronchitis / Upper respiratory symptoms - Cough, rhinorrhea, and nasal congestion present for four days - No fever recorded; temperature 98.39F today and normal at home since onset - Feels feverish despite normal temperature - Low energy and fatigue - Cough disrupts sleep - Previous cough treatments have not been effective - No known allergies to codeine  - Open to trying different cough medications - Has used nasal sprays in the past with perceived benefit  Exposure history - Wife visited an aquarium in [place], [place]  after Christmas; suspects possible exposure from this event  Respiratory medication use - Uses albuterol  rescue inhaler - Does not require a new spacer tube, only a medication itself.  Prostate cancer and hormonal therapy - Undergoing hormonal treatment for prostate cancer - Attributes sensation of hot flashes to hormonal therapy      01/08/2024    9:08 AM 06/27/2023    8:40 AM 01/21/2023    9:10 AM  Depression screen PHQ 2/9  Decreased Interest 0 0 0  Down, Depressed, Hopeless 0 0 0  PHQ - 2 Score 0 0 0  Altered sleeping 0 0 0  Tired, decreased energy 1 2 0  Change in appetite 0 0 0  Feeling bad or failure about yourself  0 0 0  Trouble concentrating 0 0 0  Moving slowly or fidgety/restless 0 0 0  Suicidal thoughts 0 0 0  PHQ-9 Score 1  2  0   Difficult doing work/chores Not difficult at all Somewhat difficult Not difficult at all      Data saved with a previous flowsheet row definition       01/08/2024    9:08 AM 06/27/2023    8:40 AM 01/21/2023    9:10 AM 10/24/2022   11:56 AM  GAD 7 : Generalized Anxiety Score  Nervous, Anxious, on Edge 0 0 0 0  Control/stop worrying 0 0 0 0  Worry too much - different things 0 0 0 0  Trouble relaxing 0 0 0 0  Restless 0 0 0 0  Easily annoyed or irritable 0 0 0 0  Afraid - awful might happen 0 0 0 0  Total GAD 7 Score 0 0 0 0  Anxiety Difficulty  Not difficult at all  Not difficult at all    Social History[1]  Review of Systems Per HPI unless specifically indicated above     Objective:    BP 132/80 (BP Location: Left Arm, Patient Position: Sitting, Cuff Size: Normal)   Pulse 73   Temp 98.3 F (36.8 C) (Oral)   Ht 5' 10 (1.778 m)   Wt 246 lb 8 oz (111.8 kg)   SpO2 96%   BMI 35.37 kg/m   Wt Readings from Last 3 Encounters:  04/02/24 246 lb 8 oz (111.8 kg)  01/08/24 245 lb 6 oz (111.3 kg)  11/05/23 245 lb (111.1 kg)  Physical Exam Vitals and nursing note reviewed.  Constitutional:      General: He is not in acute distress.    Appearance: He is well-developed. He is not diaphoretic.     Comments: Well-appearing, comfortable, cooperative  HENT:     Head: Normocephalic and atraumatic.     Nose: Congestion present.  Eyes:     General:        Right eye: No discharge.        Left eye: No discharge.     Conjunctiva/sclera: Conjunctivae normal.  Neck:     Thyroid : No thyromegaly.  Cardiovascular:     Rate and Rhythm: Normal rate and regular rhythm.     Pulses: Normal pulses.     Heart sounds: Normal heart sounds. No murmur heard. Pulmonary:     Effort: Pulmonary effort is normal. No respiratory distress.     Breath sounds: Normal breath sounds. No wheezing or rales.  Musculoskeletal:        General: Normal range of motion.     Cervical back: Normal range of motion and neck supple.  Lymphadenopathy:     Cervical: No cervical adenopathy.  Skin:     General: Skin is warm and dry.     Findings: No erythema or rash.  Neurological:     Mental Status: He is alert and oriented to person, place, and time. Mental status is at baseline.  Psychiatric:        Behavior: Behavior normal.     Comments: Well groomed, good eye contact, normal speech and thoughts     Results for orders placed or performed in visit on 04/02/24  POCT Influenza A/B   Collection Time: 04/02/24  8:58 AM  Result Value Ref Range   Influenza A, POC Negative Negative   Influenza B, POC Negative Negative      Assessment & Plan:   Problem List Items Addressed This Visit   None Visit Diagnoses       Acute bronchitis, unspecified organism    -  Primary   Relevant Medications   azithromycin  (ZITHROMAX  Z-PAK) 250 MG tablet     Flu-like symptoms       Relevant Medications   guaiFENesin -codeine  (VIRTUSSIN A/C) 100-10 MG/5ML syrup   ipratropium (ATROVENT ) 0.06 % nasal spray   Other Relevant Orders   POCT Influenza A/B (Completed)     Acute cough       Relevant Medications   benzonatate  (TESSALON ) 100 MG capsule   guaiFENesin -codeine  (VIRTUSSIN A/C) 100-10 MG/5ML syrup   albuterol  (VENTOLIN  HFA) 108 (90 Base) MCG/ACT inhaler        Acute bronchitis Likely viral etiology. Negative flu test. No fever. No imaging needed. Antibiotics not indicated unless symptoms persist beyond 5-7 days. - Prescribed Tessalon  Perles for daytime cough. - Prescribed codeine -containing cough syrup for nighttime caution sedation. - Prescribed Atrovent  nasal spray for decongestion. - Prescribed albuterol  inhaler for nighttime airway opening. - Provided Z-Pak as backup if symptoms persist beyond 5-7 days.        Orders Placed This Encounter  Procedures   POCT Influenza A/B    Meds ordered this encounter  Medications   benzonatate  (TESSALON ) 100 MG capsule    Sig: Take 1 capsule (100 mg total) by mouth 3 (three) times daily as needed for cough.    Dispense:  30 capsule     Refill:  0   guaiFENesin -codeine  (VIRTUSSIN A/C) 100-10 MG/5ML syrup    Sig: Take 5 mLs by mouth 3 (three)  times daily as needed.    Dispense:  120 mL    Refill:  0   ipratropium (ATROVENT ) 0.06 % nasal spray    Sig: Place 2 sprays into both nostrils 4 (four) times daily. For up to 5-7 days then stop.    Dispense:  15 mL    Refill:  0   albuterol  (VENTOLIN  HFA) 108 (90 Base) MCG/ACT inhaler    Sig: Inhale 2 puffs into the lungs every 6 (six) hours as needed for wheezing or shortness of breath.    Dispense:  8 g    Refill:  0   azithromycin  (ZITHROMAX  Z-PAK) 250 MG tablet    Sig: Take 2 tabs (500mg  total) on Day 1. Take 1 tab (250mg ) daily for next 4 days.    Dispense:  6 tablet    Refill:  0    Follow up plan: Return if symptoms worsen or fail to improve.  Marsa Officer, DO [facility] 04/02/2024, 9:10 AM     [1]  Social History Tobacco Use   Smoking status: Former    Current packs/day: 0.00    Average packs/day: 1 pack/day for 26.0 years (26.0 ttl pk-yrs)    Types: Cigarettes    Start date: 03/27/1963    Quit date: 03/26/1989    Years since quitting: 35.0   Smokeless tobacco: Former   Tobacco comments:    less than 1 PPD  Vaping Use   Vaping status: Never Used  Substance Use Topics   Alcohol use: No    Alcohol/week: 0.0 standard drinks of alcohol   Drug use: No   "

## 2024-04-02 NOTE — Progress Notes (Signed)
 Eric Stone.                                          MRN: [id number]   04/02/2024   The [facility] Quality Team Specialist reviewed this patient medical record for the purposes of chart review for care gap closure. The following were reviewed: chart review for care gap closure-controlling blood pressure.    [facility] Quality Team

## 2024-04-03 ENCOUNTER — Other Ambulatory Visit (HOSPITAL_COMMUNITY): Payer: Self-pay

## 2024-04-03 ENCOUNTER — Telehealth: Payer: Self-pay | Admitting: Pharmacy Technician

## 2024-04-03 NOTE — Telephone Encounter (Signed)
 Oral Oncology Patient Advocate Encounter  Pending dx verification.  Keonia Pasko (Patty) Chet Burnet, CPhT  [facility], Zelda [place], [facility] - Oral Chemotherapy Patient Advocate Specialist III Phone: [phone]  Fax: [phone]

## 2024-04-03 NOTE — Telephone Encounter (Signed)
 Oral Oncology Patient Advocate Encounter  Was successful in securing patient a $6000 grant from [facility] to provide copayment coverage for Erleada .  This will keep the out of pocket expense at $0.     [employer] ID: [id number]   The billing information is as follows and will be shared with [facility].    RxBin: [id number] PCN: [id number] Member ID: [id number] Group ID: [id number] Dates of Eligibility: 03/03/2024 through 03/02/2025  Fund:  Prostate Cancer - Medicare Access  [place] (Eric) Chet Stone, CPhT  [facility], Zelda Salmon, [facility] - Oral Chemotherapy Patient Advocate Specialist III Phone: [phone]  Fax: [phone]

## 2024-04-08 ENCOUNTER — Other Ambulatory Visit: Payer: Self-pay

## 2024-04-08 ENCOUNTER — Encounter: Payer: Self-pay | Admitting: Oncology

## 2024-04-16 ENCOUNTER — Other Ambulatory Visit: Payer: Self-pay

## 2024-04-17 ENCOUNTER — Other Ambulatory Visit (HOSPITAL_COMMUNITY): Payer: Self-pay

## 2024-04-17 ENCOUNTER — Encounter: Payer: Self-pay | Admitting: Oncology

## 2024-04-17 NOTE — Telephone Encounter (Signed)
 Oral Oncology Patient Advocate Encounter  Lorrene fully approved and added into [facility].  Pharmacy notified.  Ladene Allocca (Patty) Chet Burnet, CPhT  [facility], Zelda Salmon, [facility] - Oral Chemotherapy Patient Advocate Specialist III Phone: [phone]  Fax: [phone]

## 2024-04-20 ENCOUNTER — Other Ambulatory Visit: Payer: Self-pay

## 2024-04-20 ENCOUNTER — Other Ambulatory Visit: Payer: Self-pay | Admitting: Pharmacy Technician

## 2024-04-20 NOTE — Progress Notes (Signed)
 Specialty Pharmacy Refill Coordination Note  Eric Stone. is a 79 y.o. male contacted today regarding refills of specialty medication(s) Apalutamide  (ERLEADA )   Patient requested Delivery   Delivery date: 04/23/24   Verified address: [address]  [place]   Medication will be filled on: 04/22/24

## 2024-04-22 ENCOUNTER — Other Ambulatory Visit: Payer: Self-pay

## 2024-05-05 ENCOUNTER — Inpatient Hospital Stay

## 2024-05-05 ENCOUNTER — Inpatient Hospital Stay: Admitting: Oncology

## 2024-07-08 ENCOUNTER — Ambulatory Visit: Admitting: Family Medicine

## 2024-07-21 ENCOUNTER — Ambulatory Visit: Admitting: Dermatology
